# Patient Record
Sex: Male | Born: 1950 | Race: White | Hispanic: No | Marital: Married | Smoking: Never smoker
Health system: Southern US, Community
[De-identification: ages and names within clinical notes are randomized; demographics above are authoritative.]

## PROBLEM LIST (undated history)

## (undated) DIAGNOSIS — M1990 Unspecified osteoarthritis, unspecified site: Secondary | ICD-10-CM

## (undated) DIAGNOSIS — K648 Other hemorrhoids: Secondary | ICD-10-CM

## (undated) DIAGNOSIS — E7800 Pure hypercholesterolemia, unspecified: Secondary | ICD-10-CM

## (undated) DIAGNOSIS — K259 Gastric ulcer, unspecified as acute or chronic, without hemorrhage or perforation: Secondary | ICD-10-CM

## (undated) DIAGNOSIS — D126 Benign neoplasm of colon, unspecified: Secondary | ICD-10-CM

## (undated) DIAGNOSIS — I10 Essential (primary) hypertension: Secondary | ICD-10-CM

## (undated) DIAGNOSIS — T7840XA Allergy, unspecified, initial encounter: Secondary | ICD-10-CM

## (undated) DIAGNOSIS — J45909 Unspecified asthma, uncomplicated: Secondary | ICD-10-CM

## (undated) DIAGNOSIS — H409 Unspecified glaucoma: Secondary | ICD-10-CM

## (undated) HISTORY — PX: EYE SURGERY: SHX253

## (undated) HISTORY — DX: Benign neoplasm of colon, unspecified: D12.6

## (undated) HISTORY — DX: Unspecified osteoarthritis, unspecified site: M19.90

## (undated) HISTORY — DX: Allergy, unspecified, initial encounter: T78.40XA

## (undated) HISTORY — DX: Other hemorrhoids: K64.8

---

## 2004-03-10 ENCOUNTER — Ambulatory Visit: Payer: Self-pay | Admitting: Family Medicine

## 2006-01-26 HISTORY — PX: COLONOSCOPY: SHX174

## 2006-04-12 ENCOUNTER — Ambulatory Visit: Payer: Self-pay | Admitting: Gastroenterology

## 2006-04-27 DIAGNOSIS — D126 Benign neoplasm of colon, unspecified: Secondary | ICD-10-CM

## 2006-04-27 HISTORY — DX: Benign neoplasm of colon, unspecified: D12.6

## 2006-05-10 ENCOUNTER — Ambulatory Visit: Payer: Self-pay | Admitting: Gastroenterology

## 2011-03-31 ENCOUNTER — Encounter: Payer: Self-pay | Admitting: Gastroenterology

## 2012-04-28 ENCOUNTER — Encounter: Payer: Self-pay | Admitting: Gastroenterology

## 2012-06-18 ENCOUNTER — Emergency Department (HOSPITAL_COMMUNITY): Payer: No Typology Code available for payment source

## 2012-06-18 ENCOUNTER — Inpatient Hospital Stay (HOSPITAL_COMMUNITY)
Admission: EM | Admit: 2012-06-18 | Discharge: 2012-06-21 | DRG: 378 | Disposition: A | Discharge status: Home / Self Care | Payer: No Typology Code available for payment source | Attending: Internal Medicine | Admitting: Internal Medicine

## 2012-06-18 ENCOUNTER — Encounter (HOSPITAL_COMMUNITY): Payer: Self-pay

## 2012-06-18 DIAGNOSIS — D62 Acute posthemorrhagic anemia: Secondary | ICD-10-CM

## 2012-06-18 DIAGNOSIS — R000 Tachycardia, unspecified: Secondary | ICD-10-CM | POA: Diagnosis not present

## 2012-06-18 DIAGNOSIS — J45909 Unspecified asthma, uncomplicated: Secondary | ICD-10-CM | POA: Diagnosis present

## 2012-06-18 DIAGNOSIS — T39095A Adverse effect of salicylates, initial encounter: Secondary | ICD-10-CM | POA: Diagnosis present

## 2012-06-18 DIAGNOSIS — Z860100 Personal history of colon polyps, unspecified: Secondary | ICD-10-CM

## 2012-06-18 DIAGNOSIS — D72829 Elevated white blood cell count, unspecified: Secondary | ICD-10-CM

## 2012-06-18 DIAGNOSIS — K219 Gastro-esophageal reflux disease without esophagitis: Secondary | ICD-10-CM | POA: Diagnosis present

## 2012-06-18 DIAGNOSIS — IMO0002 Reserved for concepts with insufficient information to code with codable children: Secondary | ICD-10-CM

## 2012-06-18 DIAGNOSIS — I10 Essential (primary) hypertension: Secondary | ICD-10-CM | POA: Diagnosis present

## 2012-06-18 DIAGNOSIS — Z7982 Long term (current) use of aspirin: Secondary | ICD-10-CM

## 2012-06-18 DIAGNOSIS — T380X5A Adverse effect of glucocorticoids and synthetic analogues, initial encounter: Secondary | ICD-10-CM | POA: Diagnosis present

## 2012-06-18 DIAGNOSIS — R51 Headache: Secondary | ICD-10-CM | POA: Diagnosis present

## 2012-06-18 DIAGNOSIS — Z79899 Other long term (current) drug therapy: Secondary | ICD-10-CM

## 2012-06-18 DIAGNOSIS — E871 Hypo-osmolality and hyponatremia: Secondary | ICD-10-CM

## 2012-06-18 DIAGNOSIS — E860 Dehydration: Secondary | ICD-10-CM

## 2012-06-18 DIAGNOSIS — R7989 Other specified abnormal findings of blood chemistry: Secondary | ICD-10-CM

## 2012-06-18 DIAGNOSIS — K921 Melena: Principal | ICD-10-CM

## 2012-06-18 DIAGNOSIS — K269 Duodenal ulcer, unspecified as acute or chronic, without hemorrhage or perforation: Secondary | ICD-10-CM

## 2012-06-18 DIAGNOSIS — E7800 Pure hypercholesterolemia, unspecified: Secondary | ICD-10-CM | POA: Diagnosis present

## 2012-06-18 DIAGNOSIS — T39395A Adverse effect of other nonsteroidal anti-inflammatory drugs [NSAID], initial encounter: Secondary | ICD-10-CM

## 2012-06-18 DIAGNOSIS — I951 Orthostatic hypotension: Secondary | ICD-10-CM | POA: Diagnosis present

## 2012-06-18 DIAGNOSIS — R0602 Shortness of breath: Secondary | ICD-10-CM | POA: Diagnosis present

## 2012-06-18 DIAGNOSIS — T3995XA Adverse effect of unspecified nonopioid analgesic, antipyretic and antirheumatic, initial encounter: Secondary | ICD-10-CM | POA: Diagnosis present

## 2012-06-18 DIAGNOSIS — Z9109 Other allergy status, other than to drugs and biological substances: Secondary | ICD-10-CM

## 2012-06-18 DIAGNOSIS — K259 Gastric ulcer, unspecified as acute or chronic, without hemorrhage or perforation: Secondary | ICD-10-CM

## 2012-06-18 DIAGNOSIS — Z791 Long term (current) use of non-steroidal anti-inflammatories (NSAID): Secondary | ICD-10-CM

## 2012-06-18 DIAGNOSIS — N179 Acute kidney failure, unspecified: Secondary | ICD-10-CM

## 2012-06-18 DIAGNOSIS — K922 Gastrointestinal hemorrhage, unspecified: Secondary | ICD-10-CM | POA: Diagnosis present

## 2012-06-18 DIAGNOSIS — H409 Unspecified glaucoma: Secondary | ICD-10-CM | POA: Diagnosis present

## 2012-06-18 HISTORY — DX: Unspecified glaucoma: H40.9

## 2012-06-18 HISTORY — DX: Unspecified asthma, uncomplicated: J45.909

## 2012-06-18 HISTORY — DX: Essential (primary) hypertension: I10

## 2012-06-18 HISTORY — DX: Pure hypercholesterolemia, unspecified: E78.00

## 2012-06-18 LAB — URINALYSIS, ROUTINE W REFLEX MICROSCOPIC
Bilirubin Urine: NEGATIVE
Ketones, ur: NEGATIVE mg/dL
Nitrite: NEGATIVE
Protein, ur: NEGATIVE mg/dL
Urobilinogen, UA: 0.2 mg/dL (ref 0.0–1.0)

## 2012-06-18 LAB — CBC
HCT: 27 % — ABNORMAL LOW (ref 39.0–52.0)
MCH: 31.5 pg (ref 26.0–34.0)
MCHC: 34.8 g/dL (ref 30.0–36.0)
RDW: 13.9 % (ref 11.5–15.5)

## 2012-06-18 LAB — CBC WITH DIFFERENTIAL/PLATELET
Hemoglobin: 10.5 g/dL — ABNORMAL LOW (ref 13.0–17.0)
Lymphocytes Relative: 16 % (ref 12–46)
Lymphs Abs: 2.9 10*3/uL (ref 0.7–4.0)
MCV: 89.7 fL (ref 78.0–100.0)
Monocytes Relative: 6 % (ref 3–12)
Neutrophils Relative %: 78 % — ABNORMAL HIGH (ref 43–77)
Platelets: 294 10*3/uL (ref 150–400)
RBC: 3.4 MIL/uL — ABNORMAL LOW (ref 4.22–5.81)
WBC: 18.7 10*3/uL — ABNORMAL HIGH (ref 4.0–10.5)

## 2012-06-18 LAB — COMPREHENSIVE METABOLIC PANEL
ALT: 15 U/L (ref 0–53)
Alkaline Phosphatase: 58 U/L (ref 39–117)
CO2: 18 mEq/L — ABNORMAL LOW (ref 19–32)
GFR calc Af Amer: 39 mL/min — ABNORMAL LOW (ref >90)
GFR calc non Af Amer: 34 mL/min — ABNORMAL LOW (ref >90)
Glucose, Bld: 138 mg/dL — ABNORMAL HIGH (ref 70–99)
Potassium: 4 mEq/L (ref 3.5–5.1)
Sodium: 132 mEq/L — ABNORMAL LOW (ref 135–145)
Total Bilirubin: 0.6 mg/dL (ref 0.3–1.2)

## 2012-06-18 LAB — APTT: aPTT: 26 seconds (ref 24–37)

## 2012-06-18 LAB — CG4 I-STAT (LACTIC ACID): Lactic Acid, Venous: 1.12 mmol/L (ref 0.5–2.2)

## 2012-06-18 LAB — ABO/RH: ABO/RH(D): O POS

## 2012-06-18 MED ORDER — ONDANSETRON HCL 4 MG/2ML IJ SOLN: 4.0000 mg | Freq: Four times a day (QID) | INTRAMUSCULAR | Status: DC | PRN

## 2012-06-18 MED ORDER — ONDANSETRON HCL 4 MG/2ML IJ SOLN
4.0000 mg | Freq: Once | INTRAMUSCULAR | Status: AC
  Administered 2012-06-18: 4 mg via INTRAVENOUS
  Filled 2012-06-18: qty 2

## 2012-06-18 MED ORDER — SODIUM CHLORIDE 0.9 % IV BOLUS (SEPSIS)
1000.0000 mL | Freq: Once | INTRAVENOUS | Status: AC
  Administered 2012-06-18: 1000 mL via INTRAVENOUS

## 2012-06-18 MED ORDER — IOHEXOL 300 MG/ML  SOLN
60.0000 mL | Freq: Once | INTRAMUSCULAR | Status: AC | PRN
  Administered 2012-06-18: 60 mL via INTRAVENOUS

## 2012-06-18 MED ORDER — IOHEXOL 300 MG/ML  SOLN
50.0000 mL | Freq: Once | INTRAMUSCULAR | Status: AC | PRN
  Administered 2012-06-18: 50 mL via ORAL

## 2012-06-18 MED ORDER — ONDANSETRON HCL 4 MG PO TABS: 4.0000 mg | ORAL_TABLET | Freq: Four times a day (QID) | ORAL | Status: DC | PRN

## 2012-06-18 MED ORDER — PANTOPRAZOLE SODIUM 40 MG IV SOLR
40.0000 mg | Freq: Two times a day (BID) | INTRAVENOUS | Status: DC
  Administered 2012-06-18 – 2012-06-20 (×4): 40 mg via INTRAVENOUS
  Filled 2012-06-18 (×5): qty 40

## 2012-06-18 MED ORDER — ALBUTEROL SULFATE HFA 108 (90 BASE) MCG/ACT IN AERS: 2.0000 | INHALATION_SPRAY | RESPIRATORY_TRACT | Status: DC | PRN

## 2012-06-18 MED ORDER — SODIUM CHLORIDE 0.9 % IV SOLN
INTRAVENOUS | Status: AC
  Administered 2012-06-19: 01:00:00 via INTRAVENOUS

## 2012-06-18 NOTE — ED Notes (Signed)
MD at bedside. 

## 2012-06-18 NOTE — ED Notes (Signed)
Pt returned from CT.  MD at bedside.

## 2012-06-18 NOTE — ED Notes (Signed)
Pt states Thursday night he went to RR and had black tarry stool.  On Friday, pt had diarrhea and solid black stools.  Pt felt orthostatic after BM.  Pt states worse since then, did not feel well.  Had continued black stools.

## 2012-06-18 NOTE — H&P (Signed)
Hospital Admission Note Date: 06/18/2012  Patient name: Brett Robles Medical record number: [id number] Date of birth: 12/09/1950 Age: 62 y.o. Gender: male PCP: Janie Morning, DO  Medical Service: Internal Medicine Teaching Service  Attending physician:  Dr. Lynnae January    1st Contact: Dr. Algis Liming   Pager: [phone] 2nd Contact: Dr. Nicoletta Dress    Pager: [phone] After 5 pm or weekends: 1st Contact:      Pager: [phone] 2nd Contact:      Pager: [phone]  Chief Complaint: melena  History of Present Illness:  Mr. Smallman is a 62 yo man with PMH of glaucoma, HTN, HLD, who presents to the [facility] ED with complaints of melena. He states that on the evening 2 days prior to admission he had a bowel movement of soft, brown stool which turned into black, tarry, loose stool by completion. He awoke the next morning and felt lightheaded and short of breath upon standing. He subsequently had another black, tarry bowel movement which was more watery in consistency than the previous one.  He has continued to have black, tarry BMs since that time, and states that he has avoided eating any food, or drinking any liquids (other than water) since his symptoms began, due to concern that these would worsen the issue. He reports nausea but no vomiting.  He admits to abdominal discomfort which he describes as a "gnawing" sensation which has worsened over the last couple of weeks--he takes tums for this issue and has recently increased his use of this medication since the issue worsened (4 tums daily).  No recent sick contacts. He admits to taking two 325 mg aspirin tablets yesterday for a headache and admits to taking aspirin occasionally as needed for headache in the past.   The patient has a history of asthma and seasonal allergies. He states that for the last 2 weeks he has had worsened allergy/asthma symptoms, and was prescribed Bactrim and prednisone by his PCP approximately one week ago for this issue. He also has had decreased energy  for the last 2 weeks, and has been feeling short of breath for the same period of time (although his SOB is more severely worsened over the last 2 days, per above).   Patient had a colonoscopy in 2008 at [place] (Dr. Fuller Plan) which was revealing of an adenomatous polyp for which he is due for a repeat colonoscopy.   Meds: Current Outpatient Prescriptions  Medication Sig Dispense Refill  . acetaZOLAMIDE (DIAMOX) 250 MG tablet Take 250 mg by mouth 2 (two) times daily.      Marland Kitchen albuterol (PROVENTIL HFA;VENTOLIN HFA) 108 (90 BASE) MCG/ACT inhaler Inhale 2 puffs into the lungs every 6 (six) hours as needed for wheezing. For wheezing/shortness of breath      . beta carotene w/minerals (OCUVITE) tablet Take 1 tablet by mouth daily.      Marland Kitchen lisinopril (PRINIVIL,ZESTRIL) 10 MG tablet Take 10 mg by mouth daily.      . Multiple Vitamin (MULTIVITAMIN WITH MINERALS) TABS Take 1 tablet by mouth daily.      Marland Kitchen OVER THE COUNTER MEDICATION Place 1 drop into both eyes 2 (two) times daily as needed. For dry eyes Medication:OTC re-wetting drop (name unknown)      . pravastatin (PRAVACHOL) 40 MG tablet Take 40 mg by mouth every evening.      . sulfamethoxazole-trimethoprim (BACTRIM DS) 800-160 MG per tablet Take 1 tablet by mouth 2 (two) times daily. Began on 06/10/12  Allergies: Allergies as of 06/18/2012  . (No Known Allergies)   Past Medical History  Diagnosis Date  . Hypertension   . High cholesterol   . Glaucoma    Past surgical history: none Family history: none  History   Social History  . Marital Status: Married    Spouse Name: N/A    Number of Children: N/A  . Years of Education: N/A   Occupational History  . engineer   Social History Main Topics  . Smoking status: Never Smoker   . Smokeless tobacco: Not on file  . Alcohol Use: No  . Drug Use: No  . Sexually Active: Yes   Other Topics Concern  . Not on file   Social History Narrative  . No narrative on file    Review of  Systems: Constitutional: Denies fever, chills, diaphoresis, + appetite change and +fatigue.  HEENT: Denies photophobia, eye pain, redness, hearing loss, ear pain, +congestion, sore throat,+ rhinorrhea, sneezing, mouth sores, trouble swallowing, neck pain, neck stiffness and tinnitus.  Respiratory: + SOB, +DOE, cough, chest tightness, and wheezing.  Cardiovascular: Denies chest pain, palpitations and leg swelling.  Gastrointestinal: +nausea, vomiting, +abdominal pain, +diarrhea, constipation, +blood in stool and abdominal distention.  Genitourinary: Denies dysuria, urgency, frequency, hematuria, flank pain and difficulty urinating.  Musculoskeletal: Denies myalgias, back pain, joint swelling, arthralgias and gait problem.  Skin: Denies pallor, rash and wound.  Neurological: Denies dizziness, seizures, syncope, weakness, +lightheadedness, numbness and headaches.  Hematological: Denies adenopathy. Easy bruising, personal or family bleeding history  Psychiatric/Behavioral: Denies suicidal ideation, mood changes, confusion, nervousness, sleep disturbance and agitation  Physical Exam: Blood pressure 110/68, pulse 78, temperature 98.4 F (36.9 C), temperature source Oral, resp. rate 16, height 5' 9"  (1.753 m), weight 180 lb (81.647 kg), SpO2 100.00%. General: alert, well-developed, and cooperative to examination. Jaundiced.  Head: normocephalic and atraumatic.  Eyes: vision grossly intact, no injection and anicteric.  Mouth: pharynx pink and moist, no erythema, and no exudates.  Neck: supple, full ROM, no thyromegaly, no JVD, and no carotid bruits.  Lungs: normal respiratory effort, no accessory muscle use, normal breath sounds, no crackles, and no wheezes. Heart: normal rate, regular rhythm, no murmur, no gallop, and no rub.  Abdomen: soft, non-tender, normal bowel sounds, no distention, no guarding, no rebound tenderness, no hepatomegaly, and no splenomegaly.  Msk: no joint swelling, no joint  warmth, and no redness over joints.  Pulses: 2+ DP/PT pulses bilaterally Extremities: No cyanosis, clubbing, edema Neurologic: alert & oriented X3, cranial nerves II-XII intact, strength normal in all extremities, sensation intact to light touch, and gait normal.  Skin: turgor normal and no rashes.  Psych: Oriented X3, memory intact for recent and remote, normally interactive, good eye contact, not anxious appearing, and not depressed appearing.  Lab results: Basic Metabolic Panel:  Recent Labs  06/18/12 1842  NA 132*  K 4.0  CL 104  CO2 18*  GLUCOSE 138*  BUN 73*  CREATININE 1.98*  CALCIUM 9.2   Liver Function Tests:  Recent Labs  06/18/12 1842  AST 11  ALT 15  ALKPHOS 58  BILITOT 0.6  PROT 6.3  ALBUMIN 3.3*    Recent Labs  06/18/12 1842  LIPASE 68*   CBC:  Recent Labs  06/18/12 1842  WBC 18.7*  NEUTROABS 14.5*  HGB 10.5*  HCT 30.5*  MCV 89.7  PLT 294   Coagulation:  Recent Labs  06/18/12 1842  LABPROT 13.7  INR 1.06   Urinalysis:  Recent Labs  06/18/12 2024  COLORURINE YELLOW  LABSPEC 1.015  PHURINE 5.5  GLUCOSEU NEGATIVE  HGBUR NEGATIVE  BILIRUBINUR NEGATIVE  KETONESUR NEGATIVE  PROTEINUR NEGATIVE  UROBILINOGEN 0.2  NITRITE NEGATIVE  LEUKOCYTESUR NEGATIVE   Imaging results:  Ct Abdomen Pelvis W Contrast  06/18/2012   *RADIOLOGY REPORT*  Clinical Data: GI bleed  CT ABDOMEN AND PELVIS WITH CONTRAST  Technique:  Multidetector CT imaging of the abdomen and pelvis was performed following the standard protocol during bolus administration of intravenous contrast.  Contrast: 68m OMNIPAQUE IOHEXOL 300 MG/ML  SOLN  Comparison: None.  Findings: Lung bases are clear.  Liver, spleen, pancreas, and adrenal glands are within normal limits.  Gallbladder is unremarkable.  No intrahepatic or extrahepatic ductal dilatation.  2 mm nonobstructing right lower pole renal calculus (series 2/image 37).  Left extrarenal pelvis.  No hydronephrosis.  No evidence  of bowel obstruction.  Normal appendix.  No colonic wall thickening or inflammatory changes.  Atherosclerotic calcifications of the abdominal aorta and branch vessels.  No abdominopelvic ascites.  No suspicious abdominopelvic lymphadenopathy.  Prostate is at the upper limits of normal for size, measuring 4.9 cm in transverse dimension.  Bladder is unremarkable.  Degenerative changes of the visualized thoracolumbar spine.  Grade 1 spondylolisthesis at L5-S1.  IMPRESSION: No evidence of bowel obstruction.  Normal appendix.  No colonic wall thickening or inflammatory changes.  2 mm nonobstructing right lower pole renal calculus.  No hydronephrosis.   Original Report Authenticated By: SJulian Hy M.D.   Other results: EKG:   Assessment & Plan by Problem:  GI Bleed - Hb = 10.5. FOBT positive in the ED. Signs/symptoms are most consistent with peptic ulcer disease, particularly in the context of the patient's history of chronic aspirin usage, and her recent prednisone usage. Pt is visibly jaundiced, which he notes as a new finding as well, however total bili is wnl (of note, he takes beta-carotene). Lipase slightly elevated. CT abdomen was negative for any acute findings. Ischemic colitis cannot be ruled out, however is unlikely given his complaints have persisted for several days and he has remained stable throughout that period of time. As the patient is stable, he will be admitted to the floor at this time. - Admit to floor - Monitor vitals - d/c prednisone - Protonix IV bid - type and screen - trend CBCs q8h - Consider GI consult for endoscopy  Acute kidney injury - Cr = 1.98. No baseline on record, but pt denies CKD). AKI most likely secondary to prerenal azotemia in the setting of decreased PO intake over the last few days as is further supported by the presence of orthostatic hypotension. He received a 1L NS bolus in the ED.  - repeat 1L NS bolus - start NS @ 150cc/hr - check BMET in  AM  SOB - likely secondary to anemia associated with GI bleed. Worsened asthma could be contributing, although he was CTA on exam. [place] regularly from [place], but denies any recent leg swelling or CP, SOB has been exertional, and revised geneva = 3 (low probability) making PE less likely. O2 sats 100% on RA.  - continue to monitor symptoms.   Leukocytosis - WBC = 18.7. Likely secondary to ongoing prednisone usage.  - d/c prednisone in setting of GI bleed - check CBC in AM  Hypertension - holding ACE-I in setting of AKI and hypotension.   DVT PPx - SCDs  Dispo: Disposition is deferred at this time, awaiting improvement of  current medical problems. Anticipated discharge in approximately 1-2 day(s).   The patient does have a current PCP (COLLINS, DANA, DO), therefore will not be requiring OPC follow-up after discharge.   The patient does not have transportation limitations that hinder transportation to clinic appointments.  Signed: Jeani Hawking, MD 06/18/2012, 10:32 PM

## 2012-06-18 NOTE — ED Provider Notes (Signed)
History     CSN: [id number]  Arrival date & time 06/18/12  0865   First MD Initiated Contact with Patient 06/18/12 1812      Chief Complaint  Patient presents with  . Abdominal Pain     Patient is a 62 y.o. male presenting with hematochezia.  Rectal Bleeding Quality:  Black and tarry Amount:  Moderate Duration:  3 days Timing:  Intermittent Progression:  Unchanged Chronicity:  New Context: diarrhea   Context: not anal fissures, not constipation, not hemorrhoids and not rectal pain   Similar prior episodes: no   Relieved by:  Nothing Ineffective treatments:  None tried Associated symptoms: abdominal pain (vauge, mild, intermittent discomfort in epigastric region for a few days) and light-headedness (he becomes very lilghtheaded with standing and feels faint)   Associated symptoms: no epistaxis, no fever, no loss of consciousness, no recent illness and no vomiting   Associated symptoms comment:  Has had significant nausea and generalized weakness.  No recent weight change.  No piror history of GI bleeding, liver disease, cirrhosis, hepatitis, varices, cancer, or alcohol use. Risk factors: no anticoagulant use, no hx of colorectal cancer, no hx of colorectal surgery, no liver disease and no NSAID use    He also noted acholic stools a few days ago.  His wife noted that his skin seemed yellow yesterday.  Past Medical History  Diagnosis Date  . Hypertension   . High cholesterol   . Glaucoma     No past surgical history on file.  No family history on file.  History  Substance Use Topics  . Smoking status: Never Smoker   . Smokeless tobacco: Not on file  . Alcohol Use: No      Review of Systems  Constitutional: Negative for fever and chills.  HENT: Negative for nosebleeds, congestion, rhinorrhea, neck pain and neck stiffness.   Eyes: Negative for visual disturbance.  Respiratory: Negative for cough and shortness of breath.   Cardiovascular: Negative for chest pain  and leg swelling.  Gastrointestinal: Positive for abdominal pain (vauge, mild, intermittent discomfort in epigastric region for a few days) and hematochezia. Negative for nausea, vomiting and diarrhea.  Genitourinary: Negative for dysuria, urgency, frequency, flank pain and difficulty urinating.  Musculoskeletal: Negative for back pain.  Skin: Negative for rash.  Neurological: Positive for light-headedness (he becomes very lilghtheaded with standing and feels faint). Negative for loss of consciousness, syncope, weakness, numbness and headaches.  All other systems reviewed and are negative.    Allergies  Review of patient's allergies indicates no known allergies.  Home Medications   Current Outpatient Rx  Name  Route  Sig  Dispense  Refill  . acetaZOLAMIDE (DIAMOX) 250 MG tablet   Oral   Take 250 mg by mouth 2 (two) times daily.         Marland Kitchen albuterol (PROVENTIL HFA;VENTOLIN HFA) 108 (90 BASE) MCG/ACT inhaler   Inhalation   Inhale 2 puffs into the lungs every 6 (six) hours as needed for wheezing. For wheezing/shortness of breath         . beta carotene w/minerals (OCUVITE) tablet   Oral   Take 1 tablet by mouth daily.         Marland Kitchen lisinopril (PRINIVIL,ZESTRIL) 10 MG tablet   Oral   Take 10 mg by mouth daily.         . Multiple Vitamin (MULTIVITAMIN WITH MINERALS) TABS   Oral   Take 1 tablet by mouth daily.         Marland Kitchen  OVER THE COUNTER MEDICATION   Both Eyes   Place 1 drop into both eyes 2 (two) times daily as needed. For dry eyes Medication:OTC re-wetting drop (name unknown)         . pravastatin (PRAVACHOL) 40 MG tablet   Oral   Take 40 mg by mouth every evening.         . sulfamethoxazole-trimethoprim (BACTRIM DS) 800-160 MG per tablet   Oral   Take 1 tablet by mouth 2 (two) times daily. Began on 06/10/12           BP 99/57  Pulse 80  Temp(Src) 99 F (37.2 C) (Oral)  Resp 17  Ht 5' 9"  (1.753 m)  Wt 180 lb (81.647 kg)  BMI 26.57 kg/m2  SpO2  100%  Physical Exam  Nursing note and vitals reviewed. Constitutional: He is oriented to person, place, and time. He appears well-developed and well-nourished. No distress.  HENT:  Head: Normocephalic and atraumatic.  Mouth/Throat: Oropharynx is clear and moist.  Eyes: Conjunctivae and EOM are normal. Pupils are equal, round, and reactive to light. No scleral icterus.  Neck: Normal range of motion. Neck supple. No JVD present.  Cardiovascular: Normal rate, regular rhythm, normal heart sounds and intact distal pulses.  Exam reveals no gallop and no friction rub.   No murmur heard. Pulmonary/Chest: Effort normal and breath sounds normal. No respiratory distress. He has no wheezes. He has no rales.  Abdominal: Soft. Normal appearance and bowel sounds are normal. He exhibits no distension. There is no hepatosplenomegaly. There is no tenderness. There is no rigidity, no rebound, no guarding, no CVA tenderness, no tenderness at McBurney's point and negative Murphy's sign. No hernia.  Genitourinary: Rectal exam shows no external hemorrhoid, no fissure, no mass and no tenderness. Guaiac positive stool.  Gross melanotic stool  Musculoskeletal: He exhibits no edema.  Neurological: He is alert and oriented to person, place, and time. No cranial nerve deficit. He exhibits normal muscle tone. Coordination normal.  Skin: Skin is warm and dry. He is not diaphoretic.    ED Course  Procedures (including critical care time)  Labs Reviewed  CBC WITH DIFFERENTIAL - Abnormal; Notable for the following:    WBC 18.7 (*)    RBC 3.40 (*)    Hemoglobin 10.5 (*)    HCT 30.5 (*)    Neutrophils Relative % 78 (*)    Neutro Abs 14.5 (*)    Monocytes Absolute 1.1 (*)    All other components within normal limits  COMPREHENSIVE METABOLIC PANEL - Abnormal; Notable for the following:    Sodium 132 (*)    CO2 18 (*)    Glucose, Bld 138 (*)    BUN 73 (*)    Creatinine, Ser 1.98 (*)    Albumin 3.3 (*)    GFR calc  non Af Amer 34 (*)    GFR calc Af Amer 39 (*)    All other components within normal limits  LIPASE, BLOOD - Abnormal; Notable for the following:    Lipase 68 (*)    All other components within normal limits  CBC - Abnormal; Notable for the following:    WBC 16.5 (*)    RBC 2.98 (*)    Hemoglobin 9.4 (*)    HCT 27.0 (*)    All other components within normal limits  URINALYSIS, ROUTINE W REFLEX MICROSCOPIC  PROTIME-INR  APTT  CG4 I-STAT (LACTIC ACID)  TYPE AND SCREEN  ABO/RH   Ct Abdomen Pelvis  W Contrast  06/18/2012   *RADIOLOGY REPORT*  Clinical Data: GI bleed  CT ABDOMEN AND PELVIS WITH CONTRAST  Technique:  Multidetector CT imaging of the abdomen and pelvis was performed following the standard protocol during bolus administration of intravenous contrast.  Contrast: 72m OMNIPAQUE IOHEXOL 300 MG/ML  SOLN  Comparison: None.  Findings: Lung bases are clear.  Liver, spleen, pancreas, and adrenal glands are within normal limits.  Gallbladder is unremarkable.  No intrahepatic or extrahepatic ductal dilatation.  2 mm nonobstructing right lower pole renal calculus (series 2/image 37).  Left extrarenal pelvis.  No hydronephrosis.  No evidence of bowel obstruction.  Normal appendix.  No colonic wall thickening or inflammatory changes.  Atherosclerotic calcifications of the abdominal aorta and branch vessels.  No abdominopelvic ascites.  No suspicious abdominopelvic lymphadenopathy.  Prostate is at the upper limits of normal for size, measuring 4.9 cm in transverse dimension.  Bladder is unremarkable.  Degenerative changes of the visualized thoracolumbar spine.  Grade 1 spondylolisthesis at L5-S1.  IMPRESSION: No evidence of bowel obstruction.  Normal appendix.  No colonic wall thickening or inflammatory changes.  2 mm nonobstructing right lower pole renal calculus.  No hydronephrosis.   Original Report Authenticated By: SJulian Hy M.D.     1. GI bleed   2. Melena   3. Dehydration   4. Acute  kidney injury       MDM  62year old male with hypertension presents with melena. Onset approximately 3 days ago. Since that time he has had some shortness of breath with exertion and some lightheadedness with standing. He is coughing at times. He's had a recent steroid use. No chronic NSAID use, no history of ulcers, no prior GI bleed. No history of liver disease or coagulopathy.  Tachycardic to 101, positive orthostatics, otherwise vitals are within normal limits. He is nontoxic-appearing, in no acute distress, abdomen is soft and nontender. His stool is grossly melanotic and Hemoccult positive. Workup otherwise pertinent for white blood cell count 18.7, hemoglobin 10.5, sodium 132, bicarbonate 18, BUN 73, creatinine 1.98. INR is normal, PTT normal. Lactate normal.   patient has GI bleed, melanotic, unknown source, possibly secondary to steroid use. He is acidotic and has acute kidney injury. Leukocytosis likely also secondary to steroid use. Next  Admitted to the hospitalist for acute kidney injury and GI bleed. Stable ED course. We'll not transfuse at this time. We'll need to recheck hemoglobin in the next few hours.        JWendall Papa MD 06/19/12 [phone]

## 2012-06-18 NOTE — ED Notes (Signed)
Admitting MD at bedside.

## 2012-06-18 NOTE — ED Provider Notes (Signed)
I saw and evaluated the patient, reviewed the resident's note and I agree with the findings and plan.  Pt seen and examined--labs and xrays reviewed, will admit for w/u of gi bleed  Leota Jacobsen, MD 06/18/12 2221

## 2012-06-18 NOTE — ED Notes (Signed)
Patient transported to CT 

## 2012-06-19 ENCOUNTER — Inpatient Hospital Stay (HOSPITAL_COMMUNITY): Payer: No Typology Code available for payment source

## 2012-06-19 ENCOUNTER — Encounter (HOSPITAL_COMMUNITY)
Admission: EM | Disposition: A | Discharge status: Home / Self Care | Payer: Self-pay | Source: Home / Self Care | Attending: Internal Medicine

## 2012-06-19 ENCOUNTER — Encounter (HOSPITAL_COMMUNITY): Payer: Self-pay | Admitting: *Deleted

## 2012-06-19 DIAGNOSIS — E860 Dehydration: Secondary | ICD-10-CM

## 2012-06-19 DIAGNOSIS — K922 Gastrointestinal hemorrhage, unspecified: Secondary | ICD-10-CM

## 2012-06-19 DIAGNOSIS — E871 Hypo-osmolality and hyponatremia: Secondary | ICD-10-CM

## 2012-06-19 DIAGNOSIS — D62 Acute posthemorrhagic anemia: Secondary | ICD-10-CM

## 2012-06-19 DIAGNOSIS — K921 Melena: Secondary | ICD-10-CM

## 2012-06-19 DIAGNOSIS — K269 Duodenal ulcer, unspecified as acute or chronic, without hemorrhage or perforation: Secondary | ICD-10-CM

## 2012-06-19 DIAGNOSIS — K259 Gastric ulcer, unspecified as acute or chronic, without hemorrhage or perforation: Secondary | ICD-10-CM

## 2012-06-19 DIAGNOSIS — T39395A Adverse effect of other nonsteroidal anti-inflammatory drugs [NSAID], initial encounter: Secondary | ICD-10-CM

## 2012-06-19 DIAGNOSIS — D72829 Elevated white blood cell count, unspecified: Secondary | ICD-10-CM

## 2012-06-19 DIAGNOSIS — R7989 Other specified abnormal findings of blood chemistry: Secondary | ICD-10-CM

## 2012-06-19 DIAGNOSIS — N179 Acute kidney failure, unspecified: Secondary | ICD-10-CM

## 2012-06-19 HISTORY — PX: ESOPHAGOGASTRODUODENOSCOPY: SHX5428

## 2012-06-19 LAB — COMPREHENSIVE METABOLIC PANEL
AST: 11 U/L (ref 0–37)
Albumin: 2.9 g/dL — ABNORMAL LOW (ref 3.5–5.2)
Alkaline Phosphatase: 50 U/L (ref 39–117)
CO2: 21 mEq/L (ref 19–32)
Chloride: 112 mEq/L (ref 96–112)
Creatinine, Ser: 1.5 mg/dL — ABNORMAL HIGH (ref 0.50–1.35)
GFR calc non Af Amer: 47 mL/min — ABNORMAL LOW (ref >90)
Potassium: 4.4 mEq/L (ref 3.5–5.1)
Total Bilirubin: 0.7 mg/dL (ref 0.3–1.2)

## 2012-06-19 LAB — CBC
HCT: 23.9 % — ABNORMAL LOW (ref 39.0–52.0)
Hemoglobin: 8 g/dL — ABNORMAL LOW (ref 13.0–17.0)
MCH: 30.9 pg (ref 26.0–34.0)
MCH: 31.2 pg (ref 26.0–34.0)
MCH: 30.7 pg (ref 26.0–34.0)
MCHC: 34.1 g/dL (ref 30.0–36.0)
MCHC: 34.3 g/dL (ref 30.0–36.0)
MCHC: 33.6 g/dL (ref 30.0–36.0)
MCV: 90.4 fL (ref 78.0–100.0)
MCV: 90.9 fL (ref 78.0–100.0)
MCV: 91.2 fL (ref 78.0–100.0)
Platelets: 214 10*3/uL (ref 150–400)
Platelets: 191 10*3/uL (ref 150–400)
Platelets: 192 10*3/uL (ref 150–400)
RBC: 2.82 MIL/uL — ABNORMAL LOW (ref 4.22–5.81)
RDW: 13.9 % (ref 11.5–15.5)
RDW: 14 % (ref 11.5–15.5)
WBC: 8 10*3/uL (ref 4.0–10.5)

## 2012-06-19 LAB — PROTIME-INR: Prothrombin Time: 13.8 seconds (ref 11.6–15.2)

## 2012-06-19 LAB — RETICULOCYTES
RBC.: 3.33 MIL/uL — ABNORMAL LOW (ref 4.22–5.81)
Retic Count, Absolute: 66.6 10*3/uL (ref 19.0–186.0)

## 2012-06-19 LAB — LACTATE DEHYDROGENASE: LDH: 253 U/L — ABNORMAL HIGH (ref 94–250)

## 2012-06-19 LAB — HAPTOGLOBIN: Haptoglobin: 152 mg/dL (ref 45–215)

## 2012-06-19 SURGERY — EGD (ESOPHAGOGASTRODUODENOSCOPY)
Anesthesia: Moderate Sedation

## 2012-06-19 MED ORDER — BUTAMBEN-TETRACAINE-BENZOCAINE 2-2-14 % EX AERO
INHALATION_SPRAY | CUTANEOUS | Status: DC | PRN
  Administered 2012-06-19: 1 via TOPICAL

## 2012-06-19 MED ORDER — MIDAZOLAM HCL 10 MG/2ML IJ SOLN
INTRAMUSCULAR | Status: DC | PRN
  Administered 2012-06-19 (×2): 2 mg via INTRAVENOUS

## 2012-06-19 MED ORDER — FENTANYL CITRATE 0.05 MG/ML IJ SOLN
INTRAMUSCULAR | Status: DC | PRN
  Administered 2012-06-19 (×2): 25 ug via INTRAVENOUS

## 2012-06-19 NOTE — Progress Notes (Signed)
Subjective: Pt is doing well this AM. No compliants. No nausea/vomiting/diarrhea/ repeat dark tarry stools. No other active bleeding or BRBPR. Still some fatigue and decreased physical activity tolerance.   Objective: Vital signs in last 24 hours: Filed Vitals:   06/19/12 0000 06/19/12 0125 06/19/12 0627 06/19/12 1001  BP: 105/61 101/60 96/50 106/54  Pulse: 83 69 70 73  Temp:  97.9 F (36.6 C) 98 F (36.7 C) 98.3 F (36.8 C)  TempSrc:  Oral Oral   Resp: 24 20 18 18   Height:  5' 8"  (1.727 m)    Weight:  177 lb 14.6 oz (80.7 kg)    SpO2: 100% 100% 100% 99%   Weight change:   Intake/Output Summary (Last 24 hours) at 06/19/12 1115 Last data filed at 06/19/12 0900  Gross per 24 hour  Intake      0 ml  Output   1400 ml  Net  -1400 ml   General: resting in bed HEENT: PERRL, EOMI, no scleral icterus Cardiac: RRR, no rubs, murmurs or gallops Pulm: clear to auscultation bilaterally, moving normal volumes of air Abd: soft, nontender, nondistended, BS present Ext: warm and well perfused, no pedal edema Neuro: alert and oriented X3, cranial nerves II-XII grossly intact  Lab Results: Basic Metabolic Panel:  Recent Labs Lab 06/18/12 1842 06/19/12 0530  NA 132* 138  K 4.0 4.4  CL 104 112  CO2 18* 21  GLUCOSE 138* 118*  BUN 73* 48*  CREATININE 1.98* 1.50*  CALCIUM 9.2 8.4   Liver Function Tests:  Recent Labs Lab 06/18/12 1842 06/19/12 0530  AST 11 11  ALT 15 12  ALKPHOS 58 50  BILITOT 0.6 0.7  PROT 6.3 5.4*  ALBUMIN 3.3* 2.9*    Recent Labs Lab 06/18/12 1842  LIPASE 68*   CBC:  Recent Labs Lab 06/18/12 1842 06/18/12 2330 06/19/12 0530  WBC 18.7* 16.5* 12.6*  NEUTROABS 14.5*  --   --   HGB 10.5* 9.4* 8.7*  HCT 30.5* 27.0* 25.5*  MCV 89.7 90.6 90.4  PLT 294 241 214   Coagulation:  Recent Labs Lab 06/18/12 1842 06/19/12 0530  LABPROT 13.7 13.8  INR 1.06 1.07   Anemia Panel:  Recent Labs Lab 06/19/12 0052  RETICCTPCT 2.0    Urinalysis:  Recent Labs Lab 06/18/12 2024  COLORURINE YELLOW  LABSPEC 1.015  PHURINE 5.5  GLUCOSEU NEGATIVE  HGBUR NEGATIVE  BILIRUBINUR NEGATIVE  KETONESUR NEGATIVE  PROTEINUR NEGATIVE  UROBILINOGEN 0.2  NITRITE NEGATIVE  LEUKOCYTESUR NEGATIVE    Micro Results: No results found for this or any previous visit (from the past 240 hour(s)). Studies/Results: Dg Chest 2 View  06/19/2012   *RADIOLOGY REPORT*  Clinical Data: Shortness of breath  CHEST - 2 VIEW  Comparison: None.  Findings: Cardiomediastinal silhouette is within normal limits. The lungs are clear. No pleural effusion.  No pneumothorax.  No acute osseous abnormality.  Residual contrast within nondilated colon.  IMPRESSION: No acute cardiopulmonary process.   Original Report Authenticated By: Conchita Paris, M.D.   Ct Abdomen Pelvis W Contrast  06/18/2012   *RADIOLOGY REPORT*  Clinical Data: GI bleed  CT ABDOMEN AND PELVIS WITH CONTRAST  Technique:  Multidetector CT imaging of the abdomen and pelvis was performed following the standard protocol during bolus administration of intravenous contrast.  Contrast: 36m OMNIPAQUE IOHEXOL 300 MG/ML  SOLN  Comparison: None.  Findings: Lung bases are clear.  Liver, spleen, pancreas, and adrenal glands are within normal limits.  Gallbladder is unremarkable.  No  intrahepatic or extrahepatic ductal dilatation.  2 mm nonobstructing right lower pole renal calculus (series 2/image 37).  Left extrarenal pelvis.  No hydronephrosis.  No evidence of bowel obstruction.  Normal appendix.  No colonic wall thickening or inflammatory changes.  Atherosclerotic calcifications of the abdominal aorta and branch vessels.  No abdominopelvic ascites.  No suspicious abdominopelvic lymphadenopathy.  Prostate is at the upper limits of normal for size, measuring 4.9 cm in transverse dimension.  Bladder is unremarkable.  Degenerative changes of the visualized thoracolumbar spine.  Grade 1 spondylolisthesis at L5-S1.   IMPRESSION: No evidence of bowel obstruction.  Normal appendix.  No colonic wall thickening or inflammatory changes.  2 mm nonobstructing right lower pole renal calculus.  No hydronephrosis.   Original Report Authenticated By: Julian Hy, M.D.   Medications: I have reviewed the patient's current medications. Scheduled Meds: . pantoprazole (PROTONIX) IV  40 mg Intravenous Q12H   Continuous Infusions: . sodium chloride 150 mL/hr at 06/19/12 0037   PRN Meds:.albuterol, ondansetron (ZOFRAN) IV, ondansetron Assessment/Plan: # GI Bleed - Hb = 10.5 >>8.7. FOBT positive in the ED. Signs/symptoms are most consistent with peptic ulcer disease, particularly in the context of the patient's history of chronic aspirin usage, and her recent prednisone usage. Pt is visibly jaundiced, which he notes as a new finding as well, however total bili is wnl (of note, he takes beta-carotene). Lipase slightly elevated. CT abdomen was negative for any acute findings. Ischemic colitis cannot be ruled out, however is unlikely given his complaints have persisted for several days and he has remained stable throughout that period of time. As the patient is stable, he will be admitted to the floor at this time.  - transfer to stepdown if Hgb continues to drop or starts bleeding again - Monitor vitals  - Protonix IV bid  - type and screen  - trend CBCs q8h  - Consider GI consult for endoscopy planned today  # Acute kidney injury - Cr = 1.98 >>1.5 with IVF blouses. No baseline on record, but pt denies CKD). AKI most likely secondary to prerenal azotemia in the setting of decreased PO intake over the last few days as is further supported by the presence of orthostatic hypotension. He received total of 3L bolus.   - start NS @ 150cc/hr  - check BMET in AM   # SOB - likely secondary to anemia associated with GI bleed. Worsened asthma could be contributing, although he was CTA on exam. Pt travels regularly from [place] to [place]  on business, but denies any recent leg swelling or CP, SOB has been exertional, and revised geneva = 3 (low probability) making PE less likely. O2 sats 100% on RA.  - continue to monitor symptoms.   Leukocytosis - WBC = 18.7>>12.6. Likely secondary to ongoing prednisone usage and previous hemoconcentration.  - d/c prednisone in setting of GI bleed  - check CBC in AM   Hypotension - Pt has hx of HTN but holding ACE-I in setting of AKI and hypotension.   Dispo: Disposition is deferred at this time, awaiting improvement of current medical problems.  Anticipated discharge in approximately 1-2 day(s).   The patient does have a current PCP (COLLINS, DANA, DO), therefore will be requiring OPC follow-up after discharge.   The patient does not have transportation limitations that hinder transportation to clinic appointments.  .Services Needed at time of discharge: Y = Yes, Blank = No PT:   OT:   RN:   Equipment:  Other:     LOS: 1 day   Clinton Gallant 06/19/2012, 11:15 AM Pgr: [phone]

## 2012-06-19 NOTE — H&P (Signed)
Internal Medicine Teaching Service Attending Note Date: 06/19/2012  Patient name: Brett Robles  Medical record number: [id number]  Date of birth: 03-08-1950   I have read the documentation on this case by Dr. Para Skeans and agree with it with the following additions/observations:   In short, the patient, Brett Robles, is a 62 y.o. year old male, with past medical history of hypertension, hyperlipidemia, comes in with the chief  complaint of malena for the past couple days, recently increased epigastric pain. He has dizziness and feels short of breath and fatigued, and was orthostatic on exam in the hospital. He has been resuscitated with fluids overnight and does not have dizziness any more this morning. He feels better, has not had another BM and has no epigastric pain at this time. He is noted to have been using aspirin PRN and prednisone recently prescribed for SOB.  Past medical history, social history and medications have been reviewed.   Review of systems as per HPI and resident note.   Filed Vitals:   06/18/12 2318 06/19/12 0000 06/19/12 0125 06/19/12 0627  BP: 99/57 105/61 101/60 96/50  Pulse: 80 83 69 70  Temp: 99 F (37.2 C)  97.9 F (36.6 C) 98 F (36.7 C)  TempSrc: Oral  Oral Oral  Resp: 17 24 20 18   Height:   5' 8"  (1.727 m)   Weight:   177 lb 14.6 oz (80.7 kg)   SpO2: 100% 100% 100% 100%    Exam:  General: Lying in bed, comfortable, no acute distress. HEENT: PERRL, EOMI, does appear a little yellow skinned Heart: RRR, no rubs, murmurs or gallops. Not tachycardic.  Lungs: Clear to auscultation bilaterally, no wheezes, rales, or rhonchi. Abdomen: Soft, nontender, nondistended, BS present. Extremities: Warm, no pedal edema. Neuro: Alert and oriented X3, cranial nerves II-XII grossly intact,  strength and sensation to light touch equal in bilateral upper and lower extremities   Recent Labs Lab 06/18/12 1842 06/18/12 2330 06/19/12 0530  HGB 10.5* 9.4* 8.7*  HCT  30.5* 27.0* 25.5*  WBC 18.7* 16.5* 12.6*  PLT 294 241 214    Recent Labs Lab 06/18/12 1842 06/19/12 0530  NA 132* 138  K 4.0 4.4  CL 104 112  CO2 18* 21  GLUCOSE 138* 118*  BUN 73* 48*  CREATININE 1.98* 1.50*  CALCIUM 9.2 8.4    Recent Labs Lab 06/18/12 1842 06/19/12 0530  AST 11 11  ALT 15 12  ALKPHOS 58 50  BILITOT 0.6 0.7  PROT 6.3 5.4*  ALBUMIN 3.3* 2.9*  INR 1.06 1.07   Urinalysis    Component Value Date/Time   COLORURINE YELLOW 06/18/2012 2024   APPEARANCEUR CLEAR 06/18/2012 2024   LABSPEC 1.015 06/18/2012 2024   PHURINE 5.5 06/18/2012 2024   GLUCOSEU NEGATIVE 06/18/2012 2024   HGBUR NEGATIVE 06/18/2012 2024   BILIRUBINUR NEGATIVE 06/18/2012 2024   KETONESUR NEGATIVE 06/18/2012 2024   PROTEINUR NEGATIVE 06/18/2012 2024   UROBILINOGEN 0.2 06/18/2012 2024   NITRITE NEGATIVE 06/18/2012 2024   LEUKOCYTESUR NEGATIVE 06/18/2012 2024    Imaging results:  Ct Abdomen Pelvis W Contrast  06/18/2012 *RADIOLOGY REPORT* Clinical Data: GI bleed CT ABDOMEN AND PELVIS WITH CONTRAST Technique: Multidetector CT imaging of the abdomen and pelvis was performed following the standard protocol during bolus administration of intravenous contrast. Contrast: 5m OMNIPAQUE IOHEXOL 300 MG/ML SOLN Comparison: None. Findings: Lung bases are clear. Liver, spleen, pancreas, and adrenal glands are within normal limits. Gallbladder is unremarkable. No intrahepatic or extrahepatic ductal  dilatation. 2 mm nonobstructing right lower pole renal calculus (series 2/image 37). Left extrarenal pelvis. No hydronephrosis. No evidence of bowel obstruction. Normal appendix. No colonic wall thickening or inflammatory changes. Atherosclerotic calcifications of the abdominal aorta and branch vessels. No abdominopelvic ascites. No suspicious abdominopelvic lymphadenopathy. Prostate is at the upper limits of normal for size, measuring 4.9 cm in transverse dimension. Bladder is unremarkable. Degenerative changes of the  visualized thoracolumbar spine. Grade 1 spondylolisthesis at L5-S1. IMPRESSION: No evidence of bowel obstruction. Normal appendix. No colonic wall thickening or inflammatory changes. 2 mm nonobstructing right lower pole renal calculus. No hydronephrosis. Original Report Authenticated By: Julian Hy, M.D.    Assessment and Plan  GI Bleed in the setting of Aspirin and Prednisone use: Agree with the management of residents, Protonix IV BID and discontinuation of aspirin and prednisone at this time. Gi consultation and endoscopy awaited.   Acute anemia, with decreasing counts, in the setting of GI bleed - Currently the patient is not complaining of any dizziness, or shortness of breath. We will transfuse if the patient has another black Bm, or if he has further symptoms, or if his Hgb drops below 7.   Recent Labs Lab 06/18/12 1842 06/18/12 2330 06/19/12 0530  HGB 10.5* 9.4* 8.7*   We continue to observe, and do CBC Q8. Patient is keeping soft blood pressure, however is not tachycardic. If the patient, develops tachycardia, BP plummets further or severe abdominal pain, low threshold to move to ICU.  Acute Kidney Injury: BUN and creatinine reflect the setting of acute GI bleed. Hydrate, and observe.   Recent Labs Lab 06/18/12 1842 06/19/12 0530  BUN 73* 48*  CREATININE 1.98* 1.50*   Rest per resident note.   [place], Miller 06/19/2012, 9:49 AM.

## 2012-06-19 NOTE — Consult Note (Signed)
[facility] Consult:                                       9:24 AM 06/19/2012   Referring Provider:  Dr Para Skeans of internal medicine teaching service.  Primary Care Physician:  Janie Morning, DO of [facility] Primary Gastroenterologist:  Dr. Fuller Plan, pt has not followed up   Reason for Consultation:  Melena and normocytic anemia  HPI: Brett Robles is a 62 y.o. male.  He takes generic Excedrin once daily for headaches related to sinus congestion.  No GI meds generally as he has few if any GI sxs.  For 2 weeks taking PRN tums for relief of heartburn.  On Thursday evening had formed black stool, Friday AM this was loose and tarry.  Proceeded to drive 093 miles from [place] back to [place].  Formed black stool on Saturday.  No nausea, some dizziness with sitting and > dizziness with standing.  No syncope, no chest pain, no SOB.   On ED arrival the Hgb was 10.5, MCV was normal.  Hgb has dropped to 8.7 today. On IV BID Protonix. coags normal.  BUN elevated to 73. Creat is 1.9.  A  CT of the abdomen ins benign.      Past Medical History  Diagnosis Date  . Hypertension   . High cholesterol   . Glaucoma     History reviewed. No pertinent past surgical history.  Prior to Admission medications   Medication Sig Start Date End Date Taking? Authorizing Provider  acetaZOLAMIDE (DIAMOX) 250 MG tablet Take 250 mg by mouth 2 (two) times daily.   Yes Historical Provider, MD  albuterol (PROVENTIL HFA;VENTOLIN HFA) 108 (90 BASE) MCG/ACT inhaler Inhale 2 puffs into the lungs every 6 (six) hours as needed for wheezing. For wheezing/shortness of breath   Yes Historical Provider, MD  beta carotene w/minerals (OCUVITE) tablet Take 1 tablet by mouth daily.   Yes Historical Provider, MD  lisinopril (PRINIVIL,ZESTRIL) 10 MG tablet Take 10 mg by mouth daily.   Yes Historical Provider, MD  Multiple Vitamin (MULTIVITAMIN WITH MINERALS) TABS Take 1 tablet by mouth daily.    Yes Historical Provider, MD  OVER THE COUNTER MEDICATION Place 1 drop into both eyes 2 (two) times daily as needed. For dry eyes Medication:OTC re-wetting drop (name unknown)   Yes Historical Provider, MD  pravastatin (PRAVACHOL) 40 MG tablet Take 40 mg by mouth every evening.   Yes Historical Provider, MD  sulfamethoxazole-trimethoprim (BACTRIM DS) 800-160 MG per tablet Take 1 tablet by mouth 2 (two) times daily. Began on 06/10/12 06/10/12  Yes Historical Provider, MD  Generic excedrin                      Once daily  Scheduled Meds: . pantoprazole (PROTONIX) IV  40 mg Intravenous Q12H   Infusions: . sodium chloride 150 mL/hr at 06/19/12 0037   PRN Meds: albuterol, ondansetron (ZOFRAN) IV, ondansetron   Allergies as of 06/18/2012  . (No Known Allergies)    No family history on file.  Pt is adopted  History   Social History  . Marital Status: Married    Spouse Name: N/A    Number of Children: N/A  . Years of Education: N/A   Occupational History  . Chemical engineeer   Social History Main Topics  . Smoking status: Never Smoker   . Smokeless tobacco:  Not on file  . Alcohol Use: No  . Drug Use: No  . Sexually Active: Yes   Other Topics Concern  . Not on file   Social History Narrative  . Travels a lot across the [place]    REVIEW OF SYSTEMS: Constitutional:  Stable weight ENT:  No nose bleeds, lots of sinus allergy sxs with headaches Pulm:  No cough or SOB CV:  No palpitations, no chest pain, no periph edema GU:  No hematuria, no frequency GI:  Per HPI.  No dysphagia Heme:  No hx anemia.   Frequent purpura on UE from minor trauma Transfusions:  none Neuro:  Sinus headaches and positional dizziness.  No visual problelms acutely Derm:  No rash, no sores, no skin cancer.  Endocrine:  No sweats .  No polyuria. Immunization:  Not queried Travel:  Lots of work related domestic [place] travel.    PHYSICAL EXAM: Vital signs in last 24 hours: Temp:  [97.9 F (36.6  C)-99 F (37.2 C)] 98 F (36.7 C) (05/25 0627) Pulse Rate:  [69-109] 70 (05/25 0627) Resp:  [16-24] 18 (05/25 0627) BP: (96-131)/(50-75) 96/50 mmHg (05/25 0627) SpO2:  [98 %-100 %] 100 % (05/25 0627) Weight:  [80.7 kg (177 lb 14.6 oz)-81.647 kg (180 lb)] 80.7 kg (177 lb 14.6 oz) (05/25 0125)  General: pleasant, looks well.  comfortable Head:  No asymmetry or swellling  Eyes:  No icterus or pallor Ears:  Not HOH  Nose:  No discharge or sneezing Mouth:  Clear and moist oral MM Neck:  No mass, no thyromegaly Lungs:  Clear.  No cough.  Not SOB Heart: RRR.  No MRG Abdomen:  Soft, ND, no mass or hernia.  No HSM, no bruits.  Slight epigastric tenderness. .   Rectal: not repeated.  FOB + and dark in ED yesterday.  Musc/Skeltl: no joint swelling or deformity Extremities:  No pedal edema  Neurologic:  Pleasant, good historian, oriented x 3.  No tremor.  No extremity weakness Skin:  No rash or sores.  + strong suntan Tattoos:  none Nodes:  No inguinal adenopathy   Psych:  Pleasant, no anxiety.   Intake/Output from previous day: 05/24 0701 - 05/25 0700 In: -  Out: 700 [Urine:700] Intake/Output this shift:    LAB RESULTS:  Recent Labs  06/18/12 1842 06/18/12 2330 06/19/12 0530  WBC 18.7* 16.5* 12.6*  HGB 10.5* 9.4* 8.7*  HCT 30.5* 27.0* 25.5*  PLT 294 241 214  MCV    90   BMET Lab Results  Component Value Date   NA 138 06/19/2012   NA 132* 06/18/2012   K 4.4 06/19/2012   K 4.0 06/18/2012   CL 112 06/19/2012   CL 104 06/18/2012   CO2 21 06/19/2012   CO2 18* 06/18/2012   GLUCOSE 118* 06/19/2012   GLUCOSE 138* 06/18/2012   BUN 48* 06/19/2012   BUN 73* 06/18/2012   CREATININE 1.50* 06/19/2012   CREATININE 1.98* 06/18/2012   CALCIUM 8.4 06/19/2012   CALCIUM 9.2 06/18/2012   LFT  Recent Labs  06/18/12 1842 06/19/12 0530  PROT 6.3 5.4*  ALBUMIN 3.3* 2.9*  AST 11 11  ALT 15 12  ALKPHOS 58 50  BILITOT 0.6 0.7   PT/INR Lab Results  Component Value Date   INR 1.07  06/19/2012   INR 1.06 06/18/2012    RADIOLOGY STUDIES: Dg Chest 2 View 06/19/2012  Findings: Cardiomediastinal silhouette is within normal limits. The lungs are clear. No pleural effusion.  No pneumothorax.  No acute osseous abnormality.  Residual contrast within nondilated colon.  IMPRESSION: No acute cardiopulmonary process.   Original Report Authenticated By: Conchita Paris, M.D.   Ct Abdomen Pelvis W Contrast 06/18/2012     Findings: Lung bases are clear.  Liver, spleen, pancreas, and adrenal glands are within normal limits.  Gallbladder is unremarkable.  No intrahepatic or extrahepatic ductal dilatation.  2 mm nonobstructing right lower pole renal calculus (series 2/image 37).  Left extrarenal pelvis.  No hydronephrosis.  No evidence of bowel obstruction.  Normal appendix.  No colonic wall thickening or inflammatory changes.  Atherosclerotic calcifications of the abdominal aorta and branch vessels.  No abdominopelvic ascites.  No suspicious abdominopelvic lymphadenopathy.  Prostate is at the upper limits of normal for size, measuring 4.9 cm in transverse dimension.  Bladder is unremarkable.  Degenerative changes of the visualized thoracolumbar spine.  Grade 1 spondylolisthesis at L5-S1.  IMPRESSION: No evidence of bowel obstruction.  Normal appendix.  No colonic wall thickening or inflammatory changes.  2 mm nonobstructing right lower pole renal calculus.  No hydronephrosis.   Original Report Authenticated By: Julian Hy, M.D.    ENDOSCOPIC STUDIES: *  Colonoscopy. Pt says about 5 years ago but I find no records of this being done at [facility] or hospital.  Got 04/2012 recall letter from Dr Fuller Plan stating he was overdue for colonoscopy in  Was due for repeat colo in around 04/2011.   IMPRESSION: *  Upper GI bleed.  Rule out ulcer from ASA *  Azotemia, suspect secndary to GI bleed.  *  Normocytic anemia.  Baseline levels not known.Hgb down nearly 2 grams since yesterday.      PLAN: *  EGD.  Will  try to arrange for today.  Pt wants to proceed ASAP.   *  Continue 2 x daily Protonix IV for now.    LOS: 1 day   Azucena Freed  06/19/2012, 9:24 AM Pager: [phone]  Attending MD note:   I have reviewed the above note, examined the patient and agree with plan of treatment. Will proceed with diagnostic EGD, he is stable from bleeding standpoint. Suspect NSAID related lesion.  [facility] Pager # [phone]

## 2012-06-19 NOTE — Op Note (Signed)
[facility] [place], [id number]   ENDOSCOPY PROCEDURE REPORT  PATIENT: Brett, Robles  MR#: [id number] BIRTHDATE: Aug 04, 1950 , 62  yrs. old GENDER: Male ENDOSCOPIST: Brett Dragon, MD REFERRED BY:  [facility] PROCEDURE DATE:  06/19/2012 PROCEDURE:  EGD w/ biopsy ASA CLASS:     Class II INDICATIONS:  Dyspepsia.   History of esophageal reflux.   Acute post hemorrhagic anemia. MEDICATIONS: These medications were titrated to patient response per physician's verbal order, Versed 5 mg IV, and Fentanyl 50 mcg IV TOPICAL ANESTHETIC: Cetacaine Spray  DESCRIPTION OF PROCEDURE: After the risks benefits and alternatives of the procedure were thoroughly explained, informed consent was obtained.  The Pentax Gastroscope [id number] endoscope was introduced through the mouth and advanced to the second portion of the duodenum. Without limitations.  The instrument was slowly withdrawn as the mucosa was fully examined.        ESOPHAGUS: The mucosa of the esophagus appeared normal. , there was no hiatal hernia  STOMACH: A single  non-bleeding shallow, triangular shaped and clean-based ulcer, ranging between 3-5 mm in size, was found in the prepyloric region of the stomach.  Biopsies were taken at edge of the ulcer.  DUODENUM: A single non-bleeding irregular shaped, shallow and clean-based ulcer, ranging between 5-87m in size, with surrounding edema was found in the duodenal bulb.at the duodenal outlet .There were no stigmata of recent bleeding. Retroflexed views revealed no abnormalities.     The scope was then withdrawn from the patient and the procedure completed.  COMPLICATIONS: There were no complications. ENDOSCOPIC IMPRESSION: 1.   The mucosa of the esophagus appeared normal 2.   Single non-bleeding ulcer, ranging between 3-5 mm in size, was found in the prepyloric region of the stomach 3.   Single non-bleeding ulcer, ranging  between 5-921min size, was found in the duodenal bulb 4.   There was no blood in the stomach 5.   Biopsies taken to r/o H.Pylori  RECOMMENDATIONS: Await pathology results clear liquids avoid NSAID's, PPI long term if he is planning on using AQSA/NSAIDs  REPEAT EXAM: no recall  eSigned:  DoLafayette DragonMD 06/19/2012 2:09 PM   CC:  PATIENT NAME:  Brett Robles, MackeR#: [id number]

## 2012-06-19 NOTE — ED Provider Notes (Signed)
I saw and evaluated the patient, reviewed the resident's note and I agree with the findings and plan.  Leota Jacobsen, MD 06/19/12 [phone]

## 2012-06-20 DIAGNOSIS — E871 Hypo-osmolality and hyponatremia: Secondary | ICD-10-CM

## 2012-06-20 DIAGNOSIS — D72829 Elevated white blood cell count, unspecified: Secondary | ICD-10-CM

## 2012-06-20 DIAGNOSIS — R7989 Other specified abnormal findings of blood chemistry: Secondary | ICD-10-CM

## 2012-06-20 LAB — CBC
HCT: 22.3 % — ABNORMAL LOW (ref 39.0–52.0)
Hemoglobin: 7.1 g/dL — ABNORMAL LOW (ref 13.0–17.0)
MCH: 31.3 pg (ref 26.0–34.0)
MCH: 32.1 pg (ref 26.0–34.0)
MCHC: 34.2 g/dL (ref 30.0–36.0)
MCHC: 34.5 g/dL (ref 30.0–36.0)
MCV: 91.4 fL (ref 78.0–100.0)
Platelets: 185 10*3/uL (ref 150–400)
Platelets: 188 10*3/uL (ref 150–400)
Platelets: 193 10*3/uL (ref 150–400)
RBC: 2.43 MIL/uL — ABNORMAL LOW (ref 4.22–5.81)
RBC: 2.21 MIL/uL — ABNORMAL LOW (ref 4.22–5.81)
RDW: 14 % (ref 11.5–15.5)
RDW: 13.9 % (ref 11.5–15.5)
WBC: 8.4 10*3/uL (ref 4.0–10.5)
WBC: 9.2 10*3/uL (ref 4.0–10.5)
WBC: 9.2 10*3/uL (ref 4.0–10.5)

## 2012-06-20 LAB — BASIC METABOLIC PANEL
BUN: 24 mg/dL — ABNORMAL HIGH (ref 6–23)
CO2: 21 mEq/L (ref 19–32)
Chloride: 112 mEq/L (ref 96–112)
Creatinine, Ser: 1.13 mg/dL (ref 0.50–1.35)
GFR calc Af Amer: 78 mL/min — ABNORMAL LOW (ref >90)
GFR calc non Af Amer: 67 mL/min — ABNORMAL LOW (ref >90)

## 2012-06-20 MED ORDER — SODIUM CHLORIDE 0.9 % IV SOLN
INTRAVENOUS | Status: DC
  Administered 2012-06-20: 16:00:00 via INTRAVENOUS

## 2012-06-20 MED ORDER — PANTOPRAZOLE SODIUM 40 MG PO TBEC
40.0000 mg | DELAYED_RELEASE_TABLET | Freq: Two times a day (BID) | ORAL | Status: DC
  Administered 2012-06-20 – 2012-06-21 (×2): 40 mg via ORAL
  Filled 2012-06-20 (×2): qty 1

## 2012-06-20 MED ORDER — ACETAZOLAMIDE 250 MG PO TABS
250.0000 mg | ORAL_TABLET | Freq: Two times a day (BID) | ORAL | Status: DC
  Administered 2012-06-20 – 2012-06-21 (×2): 250 mg via ORAL
  Filled 2012-06-20 (×3): qty 1

## 2012-06-20 MED ORDER — ACETAMINOPHEN 325 MG PO TABS: 650.0000 mg | ORAL_TABLET | Freq: Four times a day (QID) | ORAL | Status: DC | PRN

## 2012-06-20 NOTE — Progress Notes (Signed)
[facility] Progress Note   Subjective  No complaints, no stool, feels weak, tolerated full liquids   Objective   Vital signs in last 24 hours: Temp:  [97.2 F (36.2 C)-98.5 F (36.9 C)] 97.8 F (36.6 C) (05/26 0603) Pulse Rate:  [71-83] 75 (05/26 0603) Resp:  [12-20] 18 (05/26 0603) BP: (93-133)/(50-82) 127/64 mmHg (05/26 0603) SpO2:  [97 %-100 %] 99 % (05/26 0603) Last BM Date: 06/19/12 General:    white male in NAD Heart:  Regular rate and rhythm; no murmurs Lungs: Respirations even and unlabored, lungs CTA bilaterally Abdomen:  Soft, nontender and nondistended. Normal bowel sounds. Extremities:  Without edema. Neurologic:  Alert and oriented,  grossly normal neurologically. Psych:  Cooperative. Normal mood and affect.  Intake/Output from previous day: 05/25 0701 - 05/26 0700 In: 660 [P.O.:360; I.V.:300] Out: 3200 [Urine:3200] Intake/Output this shift: Total I/O In: -  Out: 300 [Urine:300]  Lab Results:  Recent Labs  06/19/12 1456 06/19/12 2223 06/20/12 0728  WBC 9.2 8.0 8.4  HGB 8.2* 8.0* 7.6*  HCT 23.9* 23.8* 22.2*  PLT 191 192 185   BMET  Recent Labs  06/18/12 1842 06/19/12 0530  NA 132* 138  K 4.0 4.4  CL 104 112  CO2 18* 21  GLUCOSE 138* 118*  BUN 73* 48*  CREATININE 1.98* 1.50*  CALCIUM 9.2 8.4   LFT  Recent Labs  06/19/12 0530  PROT 5.4*  ALBUMIN 2.9*  AST 11  ALT 12  ALKPHOS 50  BILITOT 0.7   PT/INR  Recent Labs  06/18/12 1842 06/19/12 0530  LABPROT 13.7 13.8  INR 1.06 1.07    Studies/Results: Dg Chest 2 View  06/19/2012   *RADIOLOGY REPORT*  Clinical Data: Shortness of breath  CHEST - 2 VIEW  Comparison: None.  Findings: Cardiomediastinal silhouette is within normal limits. The lungs are clear. No pleural effusion.  No pneumothorax.  No acute osseous abnormality.  Residual contrast within nondilated colon.  IMPRESSION: No acute cardiopulmonary process.   Original Report Authenticated By: Conchita Paris,  M.D.   Ct Abdomen Pelvis W Contrast  06/18/2012   *RADIOLOGY REPORT*  Clinical Data: GI bleed  CT ABDOMEN AND PELVIS WITH CONTRAST  Technique:  Multidetector CT imaging of the abdomen and pelvis was performed following the standard protocol during bolus administration of intravenous contrast.  Contrast: 94m OMNIPAQUE IOHEXOL 300 MG/ML  SOLN  Comparison: None.  Findings: Lung bases are clear.  Liver, spleen, pancreas, and adrenal glands are within normal limits.  Gallbladder is unremarkable.  No intrahepatic or extrahepatic ductal dilatation.  2 mm nonobstructing right lower pole renal calculus (series 2/image 37).  Left extrarenal pelvis.  No hydronephrosis.  No evidence of bowel obstruction.  Normal appendix.  No colonic wall thickening or inflammatory changes.  Atherosclerotic calcifications of the abdominal aorta and branch vessels.  No abdominopelvic ascites.  No suspicious abdominopelvic lymphadenopathy.  Prostate is at the upper limits of normal for size, measuring 4.9 cm in transverse dimension.  Bladder is unremarkable.  Degenerative changes of the visualized thoracolumbar spine.  Grade 1 spondylolisthesis at L5-S1.  IMPRESSION: No evidence of bowel obstruction.  Normal appendix.  No colonic wall thickening or inflammatory changes.  2 mm nonobstructing right lower pole renal calculus.  No hydronephrosis.   Original Report Authenticated By: SJulian Hy M.D.       Assessment:  S/p major UGIB due to gastric and duodenal ulcers, bleeding has stopped and he is stable but Hgb is still equilibrating to 7.6 today.  H,Pylori test is pending but I suspect the ulcers are NSAID induced. OK to switch to oral PPI. OK to discharge from GI standpoint, unless there are other issues     Plan:  Advance diet Oral PPI Avoid NSAIDs OTC iron supplements outpatient F/u DR Deatra Ina, may be due for recall colonoscopy  Principal Problem:   GI bleed Active Problems:   Acute kidney injury   Dehydration    Melena   Hyponatremia   Leukocytosis   Acute blood loss anemia   Azotemia   Duodenal ulcer due to nonsteroidal anti-inflammatory drug (NSAID)   Gastric ulcer     LOS: 2 days   Delfin Edis  06/20/2012, 9:24 AM

## 2012-06-20 NOTE — Progress Notes (Signed)
Subjective: Pt tolerated EGD on 7/01 w/o complication. Pt feels well this AM. No repeat black stools. No palpitations or tachycardia.   Objective: Vital signs in last 24 hours: Filed Vitals:   06/19/12 1741 06/19/12 2209 06/20/12 0144 06/20/12 0603  BP: 112/63 124/61 133/74 127/64  Pulse: 83 78 72 75  Temp: 97.2 F (36.2 C) 98 F (36.7 C) 97.9 F (36.6 C) 97.8 F (36.6 C)  TempSrc:  Oral Oral Oral  Resp: 20 18 18 18   Height:      Weight:      SpO2: 100% 97% 98% 99%   Weight change:   Intake/Output Summary (Last 24 hours) at 06/20/12 0743 Last data filed at 06/20/12 0603  Gross per 24 hour  Intake    660 ml  Output   3200 ml  Net  -2540 ml   General: resting in bed HEENT: PERRL, EOMI, no scleral icterus Cardiac: RRR, no rubs, murmurs or gallops Pulm: clear to auscultation bilaterally, moving normal volumes of air Abd: soft, nontender, nondistended, BS present Ext: warm and well perfused, no pedal edema Neuro: alert and oriented X3, cranial nerves II-XII grossly intact  Lab Results: Basic Metabolic Panel:  Recent Labs Lab 06/18/12 1842 06/19/12 0530  NA 132* 138  K 4.0 4.4  CL 104 112  CO2 18* 21  GLUCOSE 138* 118*  BUN 73* 48*  CREATININE 1.98* 1.50*  CALCIUM 9.2 8.4   Liver Function Tests:  Recent Labs Lab 06/18/12 1842 06/19/12 0530  AST 11 11  ALT 15 12  ALKPHOS 58 50  BILITOT 0.6 0.7  PROT 6.3 5.4*  ALBUMIN 3.3* 2.9*    Recent Labs Lab 06/18/12 1842  LIPASE 68*   CBC:  Recent Labs Lab 06/18/12 1842  06/19/12 1456 06/19/12 2223  WBC 18.7*  < > 9.2 8.0  NEUTROABS 14.5*  --   --   --   HGB 10.5*  < > 8.2* 8.0*  HCT 30.5*  < > 23.9* 23.8*  MCV 89.7  < > 90.9 91.2  PLT 294  < > 191 192  < > = values in this interval not displayed. Coagulation:  Recent Labs Lab 06/18/12 1842 06/19/12 0530  LABPROT 13.7 13.8  INR 1.06 1.07   Anemia Panel:  Recent Labs Lab 06/19/12 0052  RETICCTPCT 2.0   Urinalysis:  Recent Labs Lab  06/18/12 2024  COLORURINE YELLOW  LABSPEC 1.015  PHURINE 5.5  GLUCOSEU NEGATIVE  HGBUR NEGATIVE  BILIRUBINUR NEGATIVE  KETONESUR NEGATIVE  PROTEINUR NEGATIVE  UROBILINOGEN 0.2  NITRITE NEGATIVE  LEUKOCYTESUR NEGATIVE    Micro Results: No results found for this or any previous visit (from the past 240 hour(s)). Studies/Results: Dg Chest 2 View  06/19/2012   *RADIOLOGY REPORT*  Clinical Data: Shortness of breath  CHEST - 2 VIEW  Comparison: None.  Findings: Cardiomediastinal silhouette is within normal limits. The lungs are clear. No pleural effusion.  No pneumothorax.  No acute osseous abnormality.  Residual contrast within nondilated colon.  IMPRESSION: No acute cardiopulmonary process.   Original Report Authenticated By: Conchita Paris, M.D.   Ct Abdomen Pelvis W Contrast  06/18/2012   *RADIOLOGY REPORT*  Clinical Data: GI bleed  CT ABDOMEN AND PELVIS WITH CONTRAST  Technique:  Multidetector CT imaging of the abdomen and pelvis was performed following the standard protocol during bolus administration of intravenous contrast.  Contrast: 49m OMNIPAQUE IOHEXOL 300 MG/ML  SOLN  Comparison: None.  Findings: Lung bases are clear.  Liver, spleen, pancreas, and  adrenal glands are within normal limits.  Gallbladder is unremarkable.  No intrahepatic or extrahepatic ductal dilatation.  2 mm nonobstructing right lower pole renal calculus (series 2/image 37).  Left extrarenal pelvis.  No hydronephrosis.  No evidence of bowel obstruction.  Normal appendix.  No colonic wall thickening or inflammatory changes.  Atherosclerotic calcifications of the abdominal aorta and branch vessels.  No abdominopelvic ascites.  No suspicious abdominopelvic lymphadenopathy.  Prostate is at the upper limits of normal for size, measuring 4.9 cm in transverse dimension.  Bladder is unremarkable.  Degenerative changes of the visualized thoracolumbar spine.  Grade 1 spondylolisthesis at L5-S1.  IMPRESSION: No evidence of bowel  obstruction.  Normal appendix.  No colonic wall thickening or inflammatory changes.  2 mm nonobstructing right lower pole renal calculus.  No hydronephrosis.   Original Report Authenticated By: Julian Hy, M.D.   Medications: I have reviewed the patient's current medications. Scheduled Meds: . pantoprazole (PROTONIX) IV  40 mg Intravenous Q12H   Continuous Infusions:   PRN Meds:.acetaminophen, albuterol, ondansetron (ZOFRAN) IV, ondansetron Assessment/Plan: # GI Bleed - Hb = 10.5 >>7.4. FOBT positive in the ED. Signs/symptoms are most consistent with peptic ulcer disease, particularly in the context of the patient's history of chronic aspirin usage, and her recent prednisone usage. Pt is visibly jaundiced, which he notes as a new finding as well, however total bili is wnl (of note, he takes beta-carotene). Lipase slightly elevated. CT abdomen was negative for any acute findings. Ischemic colitis cannot be ruled out, however is unlikely given his complaints have persisted for several days and he has remained stable throughout that period of time.  - transfer to stepdown as Hgb continues to drop - Monitor vitals  - Protonix IV bid  - trend CBCs q8h  - GI consulted much appreciated EGD on 5/25 with 2 small non-bleeding ulcers in prepyloric region and duodenal bulb, bx taken and pending -clear diet and advance per GI  # Acute kidney injury - Cr = 1.98 >>1.5 with IVF blouses. No baseline on record, but pt denies CKD). AKI most likely secondary to prerenal azotemia in the setting of decreased PO intake over the last few days as is further supported by the presence of orthostatic hypotension. He received total of 3L bolus.   - NS at 75 cc/hr as pt tolerating clear diet - check BMET in AM   # SOB -resolving: likely secondary to anemia associated with GI bleed. Worsened asthma could be contributing, although he was CTA on exam. Pt travels regularly from [place] to [place] on business, but denies any  recent leg swelling or CP, SOB has been exertional, and revised geneva = 3 (low probability) making PE less likely. O2 sats 100% on RA.  - continue to monitor symptoms.   # Leukocytosis-resolved - WBC = 18.7>>8.4. Likely secondary to ongoing prednisone usage and previous hemoconcentration.  - d/c prednisone in setting of GI bleed  - check CBC in AM   Hypotension -improving: SBP 90/50 on arrival with IVF and this AM 120s/60s. Pt has hx of HTN but holding ACE-I in setting of AKI and hypotension.   Dispo: Disposition is deferred at this time, awaiting improvement of current medical problems.  Anticipated discharge in approximately 1-2 day(s).   The patient does have a current PCP (COLLINS, DANA, DO), therefore will be requiring OPC follow-up after discharge.   The patient does not have transportation limitations that hinder transportation to clinic appointments.  .Services Needed at time of discharge: Y =  Yes, Blank = No PT:   OT:   RN:   Equipment:   Other:     LOS: 2 days   Clinton Gallant 06/20/2012, 7:43 AM Pgr: [phone]

## 2012-06-21 ENCOUNTER — Encounter: Payer: Self-pay | Admitting: Gastroenterology

## 2012-06-21 LAB — BASIC METABOLIC PANEL
CO2: 23 mEq/L (ref 19–32)
Calcium: 8.6 mg/dL (ref 8.4–10.5)
Chloride: 108 mEq/L (ref 96–112)
GFR calc Af Amer: 72 mL/min — ABNORMAL LOW (ref >90)
Glucose, Bld: 106 mg/dL — ABNORMAL HIGH (ref 70–99)
Potassium: 3.8 mEq/L (ref 3.5–5.1)
Sodium: 139 mEq/L (ref 135–145)

## 2012-06-21 LAB — CBC
Hemoglobin: 6.7 g/dL — CL (ref 13.0–17.0)
Hemoglobin: 8.4 g/dL — ABNORMAL LOW (ref 13.0–17.0)
MCH: 30.9 pg (ref 26.0–34.0)
MCHC: 34.4 g/dL (ref 30.0–36.0)
MCV: 91.2 fL (ref 78.0–100.0)
Platelets: 161 10*3/uL (ref 150–400)
RBC: 2.17 MIL/uL — ABNORMAL LOW (ref 4.22–5.81)
RBC: 2.72 MIL/uL — ABNORMAL LOW (ref 4.22–5.81)
WBC: 8.7 10*3/uL (ref 4.0–10.5)
WBC: 11.5 10*3/uL — ABNORMAL HIGH (ref 4.0–10.5)

## 2012-06-21 MED ORDER — PANTOPRAZOLE SODIUM 40 MG PO TBEC: 40.0000 mg | DELAYED_RELEASE_TABLET | Freq: Two times a day (BID) | ORAL | Status: DC

## 2012-06-21 NOTE — Care Management Note (Signed)
  Page 1 of 1   06/21/2012     3:02:58 PM   CARE MANAGEMENT NOTE 06/21/2012  Patient:  Brett Robles, Brett Robles   Account Number:  [id number]  Date Initiated:  06/21/2012  Documentation initiated by:  Magdalen Spatz  Subjective/Objective Assessment:     Action/Plan:   Anticipated DC Date:  06/22/2012   Anticipated DC Plan:  HOME/SELF CARE         Choice offered to / List presented to:             Status of service:   Medicare Important Message given?   (If response is "NO", the following Medicare IM given date fields will be blank) Date Medicare IM given:   Date Additional Medicare IM given:    Discharge Disposition:    Per UR Regulation:    If discussed at Long Length of Stay Meetings, dates discussed:    Comments:  06-21-12 Referreal to assist patinet with finding PCP . Patient's current PCP is not in network with his insurance plan .  Explained to patient he can call phone number on insurance card and be provided with a list of PCP's in his are that are in network . Provided patinet with list of Primary Care Resources. Also provided patient with [facility] number ( if he wants  [facility] ) .  If patinet knows of a MD that he would like , he can call that MD's office directly and see if they are accepting new patinet's and his insurance.   Patinet voiced understanding.  Magdalen Spatz RN BSN [phone]

## 2012-06-21 NOTE — Clinical Social Work Note (Signed)
Clinical Social Worker received inappropriate consult for finding a PCP for patient. CSW will sign off, as social work intervention is no longer needed.   Leandro Reasoner MSW, [place]

## 2012-06-21 NOTE — Progress Notes (Signed)
CBC results - hgb 8.4 hct 24.4. M.D. Notified of earlier order for discharge.

## 2012-06-21 NOTE — Discharge Summary (Signed)
[facility] Discharge Note  Name: Brett Robles MRN: [id number] DOB: 1950-02-27 62 y.o.  Date of Admission: 06/18/2012  6:11 PM Date of Discharge: 06/21/2012 Attending Physician: Brett Crews, MD  Discharge Diagnosis: Principal Problem:   GI bleed Active Problems:   Acute kidney injury   Dehydration   Melena   Hyponatremia   Leukocytosis   Acute blood loss anemia   Azotemia   Duodenal ulcer due to nonsteroidal anti-inflammatory drug (NSAID)   Gastric ulcer   Discharge Medications:   Medication List    TAKE these medications       acetaZOLAMIDE 250 MG tablet  Commonly known as:  DIAMOX  Take 250 mg by mouth 2 (two) times daily.     albuterol 108 (90 BASE) MCG/ACT inhaler  Commonly known as:  PROVENTIL HFA;VENTOLIN HFA  Inhale 2 puffs into the lungs every 6 (six) hours as needed for wheezing. For wheezing/shortness of breath     beta carotene w/minerals tablet  Take 1 tablet by mouth daily.     lisinopril 10 MG tablet  Commonly known as:  PRINIVIL,ZESTRIL  Take 10 mg by mouth daily.     multivitamin with minerals Tabs  Take 1 tablet by mouth daily.     OVER THE COUNTER MEDICATION  Place 1 drop into both eyes 2 (two) times daily as needed. For dry eyes  Medication:OTC re-wetting drop (name unknown)     pantoprazole 40 MG tablet  Commonly known as:  PROTONIX  Take 1 tablet (40 mg total) by mouth 2 (two) times daily.     pravastatin 40 MG tablet  Commonly known as:  PRAVACHOL  Take 40 mg by mouth every evening.     sulfamethoxazole-trimethoprim 800-160 MG per tablet  Commonly known as:  BACTRIM DS  Take 1 tablet by mouth 2 (two) times daily. Began on 06/10/12        Disposition and follow-up:   Brett Robles was discharged from [facility] in Stable condition.  At the hospital follow up visit please address: 1. CBC for Hgb 2. colonoscopy  Follow-up Appointments:      Discharge Orders    Future Appointments Provider Department Dept Phone   07/18/2012 9:00 AM Brett Artist, MD [facility] [phone]   Future Orders Complete By Expires     Increase activity slowly  As directed        Consultations: Treatment Team:  Brett Banister, MD  Procedures Performed:  Dg Chest 2 View  06/19/2012   *RADIOLOGY REPORT*  Clinical Data: Shortness of breath  CHEST - 2 VIEW  Comparison: None.  Findings: Cardiomediastinal silhouette is within normal limits. The lungs are clear. No pleural effusion.  No pneumothorax.  No acute osseous abnormality.  Residual contrast within nondilated colon.  IMPRESSION: No acute cardiopulmonary process.   Original Report Authenticated By: Brett Robles, M.D.   Ct Abdomen Pelvis W Contrast  06/18/2012   *RADIOLOGY REPORT*  Clinical Data: GI bleed  CT ABDOMEN AND PELVIS WITH CONTRAST  Technique:  Multidetector CT imaging of the abdomen and pelvis was performed following the standard protocol during bolus administration of intravenous contrast.  Contrast: 72m OMNIPAQUE IOHEXOL 300 MG/ML  SOLN  Comparison: None.  Findings: Lung bases are clear.  Liver, spleen, pancreas, and adrenal glands are within normal limits.  Gallbladder is unremarkable.  No intrahepatic or extrahepatic ductal dilatation.  2 mm nonobstructing right lower pole renal calculus (series 2/image 37).  Left extrarenal pelvis.  No hydronephrosis.  No evidence of bowel obstruction.  Normal appendix.  No colonic wall thickening or inflammatory changes.  Atherosclerotic calcifications of the abdominal aorta and branch vessels.  No abdominopelvic ascites.  No suspicious abdominopelvic lymphadenopathy.  Prostate is at the upper limits of normal for size, measuring 4.9 cm in transverse dimension.  Bladder is unremarkable.  Degenerative changes of the visualized thoracolumbar spine.  Grade 1 spondylolisthesis at L5-S1.  IMPRESSION: No evidence of bowel obstruction.  Normal appendix.  No  colonic wall thickening or inflammatory changes.  2 mm nonobstructing right lower pole renal calculus.  No hydronephrosis.   Original Report Authenticated By: Brett Robles, M.D.    Admission HPI: [name] is a 62 yo man with PMH of glaucoma, HTN, HLD, who [facility] ED with complaints of melena. He states that on the evening 2 days prior to admission he had a bowel movement of soft, brown stool which turned into black, tarry, loose stool by completion. He awoke the next morning and felt lightheaded and short of breath upon standing. He subsequently had another black, tarry bowel movement which was more watery in consistency than the previous one. He has continued to have black, tarry BMs since that time, and states that he has avoided eating any food, or drinking any liquids (other than water) since his symptoms began, due to concern that these would worsen the issue. He reports nausea but no vomiting. He admits to abdominal discomfort which he describes as a "gnawing" sensation which has worsened over the last couple of weeks--he takes tums for this issue and has recently increased his use of this medication since the issue worsened (4 tums daily). No recent sick contacts. He admits to taking two 325 mg aspirin tablets yesterday for a headache and admits to taking aspirin occasionally as needed for headache in the past.  The patient has a history of asthma and seasonal allergies. He states that for the last 2 weeks he has had worsened allergy/asthma symptoms, and was prescribed Bactrim and prednisone by his PCP approximately one week ago for this issue. He also has had decreased energy for the last 2 weeks, and has been feeling short of breath for the same period of time (although his SOB is more severely worsened over the last 2 days, per above).  Patient had a colonoscopy in 2008 at [facility] (Brett Robles) which was revealing of an adenomatous polyp for which he is due for a repeat  colonoscopy.   Hospital Course by problem list: # GI Bleed w/acute blood loss anemia- Hb = 10.5 >>6.7 throughout admission. FOBT positive in the ED. Signs/symptoms are most consistent with peptic ulcer disease, particularly in the context of the patient's history of chronic aspirin usage, and recent prednisone usage. Pt is visibly jaundiced, which he notes as a new finding as well, however total bili is wnl (of note, he takes beta-carotene). Lipase was slightly elevated but CT abdomen was negative for any acute findings. GI consulted performed EGD on 5/25 showed 2 small non-bleeding ulcers in prepyloric region and duodenal bulb, bx taken and were pending at d/c. Pt never decompensated but continued to have some drop in Hgb and recieved a total of 1 PRBC transfusion and upon d/c Hgb 8.1 with close f/u with PCP and f/u with GI to pursue colonoscopy as outpt.   # Acute kidney injury resolved- Cr on admission 1.98 >>1.2 with IVF blouses. No baseline on record, but pt  denies CKD). AKI most likely secondary to prerenal azotemia in the setting of decreased PO intake over the last few days as is further supported by the presence of orthostatic hypotension. He received total of 3L bolus that normalized Cr.   # SOB: likely secondary to anemia associated with GI bleed. Worsened asthma could be contributing, although he no exam findings. Pt travels regularly from [place] to [place] on business, but denies any recent leg swelling or CP, SOB has been exertional, and revised geneva = 3 (low probability) making PE less likely. O2 sats 100% on RA.    #Hypotension: SBP 90/50 on arrival but normalized with IVF hydrations. Pt has hx of HTN but ACE-I was held in setting of AKI and hypotension. This was restarted at d/c.    Discharge Vitals:  BP 149/78  Pulse 89  Temp(Src) 97.7 F (36.5 C) (Oral)  Resp 20  Ht 5' 8"  (1.727 m)  Wt 177 lb 14.6 oz (80.7 kg)  BMI 27.06 kg/m2  SpO2 100% General: resting in bed, NAD  HEENT:  PERRL, EOMI, no scleral icterus  Cardiac: RRR, no rubs, murmurs or gallops  Pulm: clear to auscultation bilaterally, moving normal volumes of air  Abd: soft, nontender, nondistended, BS present  Ext: warm and well perfused, no pedal edema, generalized yellow pigmentation to skin  Neuro: alert and oriented X3, cranial nerves II-XII grossly intact  Discharge Labs:  Results for orders placed during the hospital encounter of 06/18/12 (from the past 24 hour(s))  CBC     Status: Abnormal   Collection Time    06/20/12  9:07 PM      Result Value Range   WBC 9.2  4.0 - 10.5 K/uL   RBC 2.21 (*) 4.22 - 5.81 MIL/uL   Hemoglobin 7.1 (*) 13.0 - 17.0 g/dL   HCT 20.2 (*) 39.0 - 52.0 %   MCV 91.4  78.0 - 100.0 fL   MCH 32.1  26.0 - 34.0 pg   MCHC 35.1  30.0 - 36.0 g/dL   RDW 13.9  11.5 - 15.5 %   Platelets 193  150 - 400 K/uL  BASIC METABOLIC PANEL     Status: Abnormal   Collection Time    06/21/12  4:15 AM      Result Value Range   Sodium 139  135 - 145 mEq/L   Potassium 3.8  3.5 - 5.1 mEq/L   Chloride 108  96 - 112 mEq/L   CO2 23  19 - 32 mEq/L   Glucose, Bld 106 (*) 70 - 99 mg/dL   BUN 20  6 - 23 mg/dL   Creatinine, Ser 1.20  0.50 - 1.35 mg/dL   Calcium 8.6  8.4 - 10.5 mg/dL   GFR calc non Af Amer 62 (*) >90 mL/min   GFR calc Af Amer 72 (*) >90 mL/min  CBC     Status: Abnormal   Collection Time    06/21/12  4:15 AM      Result Value Range   WBC 8.7  4.0 - 10.5 K/uL   RBC 2.17 (*) 4.22 - 5.81 MIL/uL   Hemoglobin 6.7 (*) 13.0 - 17.0 g/dL   HCT 19.8 (*) 39.0 - 52.0 %   MCV 91.2  78.0 - 100.0 fL   MCH 30.9  26.0 - 34.0 pg   MCHC 33.8  30.0 - 36.0 g/dL   RDW 13.8  11.5 - 15.5 %   Platelets 161  150 - 400 K/uL  PREPARE RBC (  CROSSMATCH)     Status: None   Collection Time    06/21/12  5:42 AM      Result Value Range   Order Confirmation ORDER PROCESSED BY BLOOD BANK      Signed: Clinton Gallant 06/21/2012, 4:10 PM   Time Spent on Discharge: 25 min Services Ordered on Discharge:  none Equipment Ordered on Discharge: none

## 2012-06-21 NOTE — Progress Notes (Signed)
Discharge instructions reviewed with patient. Questions answered re: prescription. f/u appointment, and when to call the doctor. Printed AVS given to patient. Written education material on peptic ulcer and iron-rich diet given earlier. Patient verbalizes understanding. Printed AVS given to patient. Discharged to home via wheelchair. Accompanied by wife.

## 2012-06-21 NOTE — Progress Notes (Signed)
[facility] Daily Rounding Note 06/21/2012, 11:03 AM  SUBJECTIVE:       No stools since Saturday, when it was dark.  No nausea, tolerating solids.  No abdominal pain.  No dizziness.   OBJECTIVE:         Vital signs in last 24 hours:    Temp:  [97.9 F (36.6 C)-99.2 F (37.3 C)] 97.9 F (36.6 C) (05/27 1011) Pulse Rate:  [82-104] 87 (05/27 1011) Resp:  [16-20] 18 (05/27 1011) BP: (128-156)/(64-79) 128/67 mmHg (05/27 1011) SpO2:  [98 %-100 %] 98 % (05/27 1011) Last BM Date: 06/20/12 General: looks  Well.    Heart: RRR Chest: clear B. No resp distress Abdomen: soft, active BS, NT, ND  Extremities: no CCE Neuro/Psych:  Oriented x 3.    Intake/Output from previous day: 05/26 0701 - 05/27 0700 In: 200 [P.O.:200] Out: 600 [Urine:600]  Intake/Output this shift: Total I/O In: 357.5 [P.O.:240; I.V.:20; Blood:97.5] Out: 275 [Urine:275]  Lab Results:  Recent Labs  06/20/12 1410 06/20/12 2107 06/21/12 0415  WBC 9.2 9.2 8.7  HGB 7.7* 7.1* 6.7*  HCT 22.3* 20.2* 19.8*  PLT 188 193 161  MCV    91  BMET  Recent Labs  06/19/12 0530 06/20/12 0946 06/21/12 0415  NA 138 141 139  K 4.4 4.0 3.8  CL 112 112 108  CO2 21 21 23   GLUCOSE 118* 98 106*  BUN 48* 24* 20  CREATININE 1.50* 1.13 1.20  CALCIUM 8.4 8.2* 8.6   LFT  Recent Labs  06/18/12 1842 06/19/12 0530  PROT 6.3 5.4*  ALBUMIN 3.3* 2.9*  AST 11 11  ALT 15 12  ALKPHOS 58 50  BILITOT 0.6 0.7   PT/INR  Recent Labs  06/18/12 1842 06/19/12 0530  LABPROT 13.7 13.8  INR 1.06 1.07    Studies/Results: Called pathology dept today.  Pt had adenomatous polyp of cecum in 04/2006.  Never came back for repeat study despite letters in 2013 and 2014.  Unable to locate the colonoscopy report however.   EGD  06/19/12 1. The mucosa of the esophagus appeared normal  2. Single non-bleeding ulcer, ranging between 3-5 mm in size, was  found in the prepyloric region of the stomach  3. Single non-bleeding ulcer,  ranging between 5-44m in size, was  found in the duodenal bulb  4. There was no blood in the stomach  5. Biopsies taken to r/o H.Pylori  CT scan Ab/Pelvis  06/18/12 IMPRESSION: No evidence of bowel obstruction. Normal appendix. No colonic wall thickening or inflammatory changes. 2 mm nonobstructing right lower pole renal calculus. No hydronephrosis.   ASSESMENT: *  GI bleed.   EGD 06/19/12: non-bleeding ulcers.  Pathology pending.  Was taking once daily Excedrin PTA.  On BID oral Protonix.  Was on IV Protonix.   *  Normocytic anemia.  Hgb is down nearly 5 grams since admission (was 10.5).  Getting one of one units PRBCs now. Pt not symptomatic. *  Renal insufficiency, resolved.  Suggests dehydration.  Correction of this is adding dilutional effect to the H & H.  *  Adenomatous colon polyps 2008.  Overdue for colonoscopy  PLAN: *  Discussed with Dr JArdis Hughs  Observe pt for now.  Still plan for outpt colonoscopy.  No repeat EGD planned.  Follow H & H.  I will arrange appt    LOS: 3 days   SAzucena Freed 06/21/2012, 11:03 AM Pager: [phone]  ________________________________________________________________________  LVelora HecklerGI MD  note:  I personally examined the patient, reviewed the data and agree with the assessment and plan described above.  He had single dark stool around lunch, otherwise feels great.  Had 1 unit prbc this AM.  HE was taking 530m ASA daily and then 2 pulse doses of prednisone.  No PPI.  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  He knows to avoid ASA and other NSAIDs, use tylenol for pains, stay on PPI once daily and iron supplement (OTC) once daily until seen again in our office in about 6 weeks. We will set up that appt.  At that point, will discuss repeat EGD to confirm healing of GU/DU and also perform surveillance, screening colonoscopy if indicated at same time.   DOwens Loffler MD LGood [facility]itan [facility] Center[facility]astroenterology Pager [phone]

## 2012-06-21 NOTE — Progress Notes (Signed)
Internal Medicine Teaching Service Attending Note Date: 06/21/2012  Patient name: Brett Robles  Medical record number: [id number]  Date of birth: 12-23-1950    This patient has been seen and discussed with the house staff. Please see their note for complete details. I concur with their findings with the following additions/corrections: Mr Bentz was standing and walking and was without dizziness. He is anxious to leave to go home. PRBC ended 1230 today so will move up the CBC and if OK can go home. Dr Ardis Hughs has seen and has no further rec other than outpt F/U.   CBC D/C if see expected increase in HgB  [facility] 06/21/2012, 4:28 PM

## 2012-06-21 NOTE — Progress Notes (Signed)
Subjective: Pt had only one episode of tachycardia yesterday describes that was anxious when heard news wasn't able to go home yesterday. No gross blood noticed, no repeat black tarry stools, no BRBPR, nausea/vomiting/diarrhea. No pain, SOB, HA, palpitations, DOE noted this AM or overnight.   Objective: Vital signs in last 24 hours: Filed Vitals:   06/20/12 2150 06/21/12 0629 06/21/12 0830 06/21/12 0900  BP: 154/79 142/68 139/69 135/64  Pulse: 91 82 99 83  Temp: 98.1 F (36.7 C) 98.1 F (36.7 C) 98.4 F (36.9 C) 98.2 F (36.8 C)  TempSrc: Oral Oral Oral Oral  Resp: 16 16 20 20   Height:      Weight:      SpO2: 100% 100%     Weight change:   Intake/Output Summary (Last 24 hours) at 06/21/12 0937 Last data filed at 06/21/12 0900  Gross per 24 hour  Intake  557.5 ml  Output    300 ml  Net  257.5 ml   General: resting in bed, NAD HEENT: PERRL, EOMI, no scleral icterus Cardiac: RRR, no rubs, murmurs or gallops Pulm: clear to auscultation bilaterally, moving normal volumes of air Abd: soft, nontender, nondistended, BS present Ext: warm and well perfused, no pedal edema, generalized yellow pigmentation to skin Neuro: alert and oriented X3, cranial nerves II-XII grossly intact  Lab Results: Basic Metabolic Panel:  Recent Labs Lab 06/20/12 0946 06/21/12 0415  NA 141 139  K 4.0 3.8  CL 112 108  CO2 21 23  GLUCOSE 98 106*  BUN 24* 20  CREATININE 1.13 1.20  CALCIUM 8.2* 8.6   Liver Function Tests:  Recent Labs Lab 06/18/12 1842 06/19/12 0530  AST 11 11  ALT 15 12  ALKPHOS 58 50  BILITOT 0.6 0.7  PROT 6.3 5.4*  ALBUMIN 3.3* 2.9*    Recent Labs Lab 06/18/12 1842  LIPASE 68*   CBC:  Recent Labs Lab 06/18/12 1842  06/20/12 2107 06/21/12 0415  WBC 18.7*  < > 9.2 8.7  NEUTROABS 14.5*  --   --   --   HGB 10.5*  < > 7.1* 6.7*  HCT 30.5*  < > 20.2* 19.8*  MCV 89.7  < > 91.4 91.2  PLT 294  < > 193 161  < > = values in this interval not  displayed. Coagulation:  Recent Labs Lab 06/18/12 1842 06/19/12 0530  LABPROT 13.7 13.8  INR 1.06 1.07   Anemia Panel:  Recent Labs Lab 06/19/12 0052  RETICCTPCT 2.0   Urinalysis:  Recent Labs Lab 06/18/12 2024  COLORURINE YELLOW  LABSPEC 1.015  PHURINE 5.5  GLUCOSEU NEGATIVE  HGBUR NEGATIVE  BILIRUBINUR NEGATIVE  KETONESUR NEGATIVE  PROTEINUR NEGATIVE  UROBILINOGEN 0.2  NITRITE NEGATIVE  LEUKOCYTESUR NEGATIVE    Micro Results: No results found for this or any previous visit (from the past 240 hour(s)). Studies/Results: No results found. Medications: I have reviewed the patient's current medications. Scheduled Meds: . acetaZOLAMIDE  250 mg Oral BID  . pantoprazole  40 mg Oral BID   Continuous Infusions:   PRN Meds:.albuterol, ondansetron (ZOFRAN) IV, ondansetron Assessment/Plan: # GI Bleed w/acute blood loss anemia- Hb = 10.5 >>6.7 on 5/27. FOBT positive in the ED. Signs/symptoms are most consistent with peptic ulcer disease, particularly in the context of the patient's history of chronic aspirin usage, and her recent prednisone usage. Pt is visibly jaundiced, which he notes as a new finding as well, however total bili is wnl (of note, he takes beta-carotene). Lipase slightly elevated.  CT abdomen was negative for any acute findings. Ischemic colitis cannot be ruled out, however is unlikely given his complaints have persisted for several days and he has remained stable throughout that period of time.  - transfer to stepdown if pt becomes symptomatic or vital signs indicate decompensation - Monitor vitals  - Protonix IV bid  - trend CBCs q8h  - GI consulted much appreciated EGD on 5/25 with 2 small non-bleeding ulcers in prepyloric region and duodenal bulb, bx taken and pending -GI originally signed off but was reconsulted today given concern for still active bleeding and question to consider colonoscopy while inpt- will await recs  # Acute kidney injury  resolaved- Cr = 1.98 >>1.2 with IVF blouses. No baseline on record, but pt denies CKD). AKI most likely secondary to prerenal azotemia in the setting of decreased PO intake over the last few days as is further supported by the presence of orthostatic hypotension. He received total of 3L bolus.   - NS at 75 cc/hr as pt tolerating clear diet - check BMET in AM   # SOB -resolving: likely secondary to anemia associated with GI bleed. Worsened asthma could be contributing, although he was CTA on exam. Pt travels regularly from [place] to [place] on business, but denies any recent leg swelling or CP, SOB has been exertional, and revised geneva = 3 (low probability) making PE less likely. O2 sats 100% on RA.  - continue to monitor symptoms.   #Hypotension -improving: SBP 90/50 on arrival with IVF and this AM 120s/60s. Pt has hx of HTN but holding ACE-I in setting of AKI and hypotension.   Dispo: Disposition is deferred at this time, awaiting improvement of current medical problems.  Anticipated discharge in approximately 1-2 day(s).   The patient does have a current PCP (COLLINS, DANA, DO), therefore will be requiring OPC follow-up after discharge.   The patient does not have transportation limitations that hinder transportation to clinic appointments.  .Services Needed at time of discharge: Y = Yes, Blank = No PT:   OT:   RN:   Equipment:   Other:     LOS: 3 days   Clinton Gallant 06/21/2012, 9:37 AM Pgr: [phone]

## 2012-06-21 NOTE — Plan of Care (Signed)
Problem: Food- and Nutrition-Related Knowledge Deficit (NB-1.1) Goal: Nutrition education Formal process to instruct or train a patient/client in a skill or to impart knowledge to help patients/clients voluntarily manage or modify food choices and eating behavior to maintain or improve health.  Outcome: Completed/Met Date Met:  06/21/12  RD consulted for nutrition education regarding high iron diet.  Body mass index is 27.06 kg/(m^2). Pt meets criteria for overweight based on current BMI.  RD provided "Iron Nutrition Therapy" handout from the Academy of [employer]. Emphasized high fiber food list and reviewed ways to incorporate in diet. Emphasized beverages that may prevent iron absorption and recommended curbing intake of these beverages at meals. Encouraged pt to discuss supplement use with MD and cautioned regarding constipation with iron supplements. Encouraged adequate hydration and increased fiber if initiating. Teach back method used.  Expect good compliance.  Current diet order is Heart Healthy, patient is consuming approximately 50% of meals at this time. Labs and medications reviewed. No further nutrition interventions warranted at this time. RD contact information provided. If additional nutrition issues arise, please re-consult RD.  Brynda Greathouse, MS RD LDN Clinical Inpatient Dietitian Pager: [phone] Weekend/After hours pager: [phone]

## 2012-06-22 ENCOUNTER — Telehealth: Payer: Self-pay | Admitting: Internal Medicine

## 2012-06-22 ENCOUNTER — Encounter: Payer: Self-pay | Admitting: Internal Medicine

## 2012-06-22 ENCOUNTER — Encounter (HOSPITAL_COMMUNITY): Payer: Self-pay | Admitting: Internal Medicine

## 2012-06-22 LAB — TYPE AND SCREEN
Unit division: 0
Unit division: 0

## 2012-06-22 NOTE — Telephone Encounter (Signed)
Gave patient results as per letter dictated by Dr. Olevia Perches.

## 2012-06-24 ENCOUNTER — Telehealth: Payer: Self-pay | Admitting: Internal Medicine

## 2012-06-24 NOTE — Telephone Encounter (Signed)
I spoke with Brett Robles at [facility].  Patient had Hgb check last night and was 6.7.  Patient was heme positive in their office and reporting black stools.  They have sent him to [facility] for 2 units PRBC out patient  Today.  They want the patient seen on Monday.  I have discussed with Dr. Olevia Perches and Ardis Hughs (both cared for him during his recent admission), They want him to be evaluated in the ER at [facility] rather than travel to [place].  Brett Robles notified

## 2012-06-25 ENCOUNTER — Inpatient Hospital Stay (HOSPITAL_COMMUNITY)
Admission: EM | Admit: 2012-06-25 | Discharge: 2012-06-28 | DRG: 378 | Disposition: A | Discharge status: Home / Self Care | Payer: No Typology Code available for payment source | Attending: Internal Medicine | Admitting: Internal Medicine

## 2012-06-25 ENCOUNTER — Encounter (HOSPITAL_COMMUNITY): Payer: Self-pay | Admitting: Emergency Medicine

## 2012-06-25 DIAGNOSIS — K253 Acute gastric ulcer without hemorrhage or perforation: Secondary | ICD-10-CM

## 2012-06-25 DIAGNOSIS — R17 Unspecified jaundice: Secondary | ICD-10-CM | POA: Diagnosis present

## 2012-06-25 DIAGNOSIS — K922 Gastrointestinal hemorrhage, unspecified: Principal | ICD-10-CM

## 2012-06-25 DIAGNOSIS — Z79899 Other long term (current) drug therapy: Secondary | ICD-10-CM

## 2012-06-25 DIAGNOSIS — Z860100 Personal history of colon polyps, unspecified: Secondary | ICD-10-CM

## 2012-06-25 DIAGNOSIS — I10 Essential (primary) hypertension: Secondary | ICD-10-CM | POA: Diagnosis present

## 2012-06-25 DIAGNOSIS — K2970 Gastritis, unspecified, without bleeding: Secondary | ICD-10-CM | POA: Diagnosis present

## 2012-06-25 DIAGNOSIS — K921 Melena: Secondary | ICD-10-CM

## 2012-06-25 DIAGNOSIS — K269 Duodenal ulcer, unspecified as acute or chronic, without hemorrhage or perforation: Secondary | ICD-10-CM

## 2012-06-25 DIAGNOSIS — K648 Other hemorrhoids: Secondary | ICD-10-CM | POA: Diagnosis present

## 2012-06-25 DIAGNOSIS — D126 Benign neoplasm of colon, unspecified: Secondary | ICD-10-CM

## 2012-06-25 DIAGNOSIS — E7800 Pure hypercholesterolemia, unspecified: Secondary | ICD-10-CM | POA: Diagnosis present

## 2012-06-25 DIAGNOSIS — R110 Nausea: Secondary | ICD-10-CM | POA: Diagnosis present

## 2012-06-25 DIAGNOSIS — D62 Acute posthemorrhagic anemia: Secondary | ICD-10-CM

## 2012-06-25 DIAGNOSIS — H409 Unspecified glaucoma: Secondary | ICD-10-CM | POA: Diagnosis present

## 2012-06-25 DIAGNOSIS — K259 Gastric ulcer, unspecified as acute or chronic, without hemorrhage or perforation: Secondary | ICD-10-CM | POA: Diagnosis present

## 2012-06-25 DIAGNOSIS — J45909 Unspecified asthma, uncomplicated: Secondary | ICD-10-CM | POA: Diagnosis present

## 2012-06-25 HISTORY — DX: Gastric ulcer, unspecified as acute or chronic, without hemorrhage or perforation: K25.9

## 2012-06-25 LAB — COMPREHENSIVE METABOLIC PANEL
ALT: 15 U/L (ref 0–53)
Alkaline Phosphatase: 41 U/L (ref 39–117)
CO2: 23 mEq/L (ref 19–32)
GFR calc Af Amer: 74 mL/min — ABNORMAL LOW (ref >90)
Glucose, Bld: 121 mg/dL — ABNORMAL HIGH (ref 70–99)
Potassium: 3.7 mEq/L (ref 3.5–5.1)
Sodium: 136 mEq/L (ref 135–145)
Total Protein: 5.3 g/dL — ABNORMAL LOW (ref 6.0–8.3)

## 2012-06-25 LAB — CBC WITH DIFFERENTIAL/PLATELET
Basophils Absolute: 0.1 10*3/uL (ref 0.0–0.1)
Basophils Relative: 0 % (ref 0–1)
Eosinophils Absolute: 0.2 10*3/uL (ref 0.0–0.7)
Eosinophils Relative: 1 % (ref 0–5)
HCT: 19.3 % — ABNORMAL LOW (ref 39.0–52.0)
MCH: 31 pg (ref 26.0–34.0)
MCHC: 34.7 g/dL (ref 30.0–36.0)
Monocytes Absolute: 1.1 10*3/uL — ABNORMAL HIGH (ref 0.1–1.0)
Neutro Abs: 10.2 10*3/uL — ABNORMAL HIGH (ref 1.7–7.7)
RDW: 16.7 % — ABNORMAL HIGH (ref 11.5–15.5)

## 2012-06-25 LAB — CBC
Hemoglobin: 6.8 g/dL — CL (ref 13.0–17.0)
MCH: 31.2 pg (ref 26.0–34.0)
MCHC: 34.7 g/dL (ref 30.0–36.0)
MCV: 89.9 fL (ref 78.0–100.0)

## 2012-06-25 LAB — APTT: aPTT: 26 seconds (ref 24–37)

## 2012-06-25 LAB — PREPARE RBC (CROSSMATCH)

## 2012-06-25 MED ORDER — ONDANSETRON HCL 4 MG PO TABS: 4.0000 mg | ORAL_TABLET | Freq: Four times a day (QID) | ORAL | Status: DC | PRN

## 2012-06-25 MED ORDER — SODIUM CHLORIDE 0.9 % IV BOLUS (SEPSIS)
1000.0000 mL | Freq: Once | INTRAVENOUS | Status: AC
  Administered 2012-06-25: 1000 mL via INTRAVENOUS

## 2012-06-25 MED ORDER — SODIUM CHLORIDE 0.9 % IV SOLN
80.0000 mg | Freq: Once | INTRAVENOUS | Status: AC
  Administered 2012-06-25: 80 mg via INTRAVENOUS
  Filled 2012-06-25 (×2): qty 80

## 2012-06-25 MED ORDER — ACETAMINOPHEN 650 MG RE SUPP: 650.0000 mg | Freq: Four times a day (QID) | RECTAL | Status: DC | PRN

## 2012-06-25 MED ORDER — DORZOLAMIDE HCL-TIMOLOL MAL 2-0.5 % OP SOLN
1.0000 [drp] | Freq: Two times a day (BID) | OPHTHALMIC | Status: DC
  Administered 2012-06-25 – 2012-06-27 (×4): 1 [drp] via OPHTHALMIC
  Filled 2012-06-25: qty 10

## 2012-06-25 MED ORDER — LATANOPROST 0.005 % OP SOLN
1.0000 [drp] | Freq: Every day | OPHTHALMIC | Status: DC
  Administered 2012-06-25 – 2012-06-27 (×3): 1 [drp] via OPHTHALMIC
  Filled 2012-06-25: qty 2.5

## 2012-06-25 MED ORDER — ONDANSETRON HCL 4 MG/2ML IJ SOLN: 4.0000 mg | Freq: Four times a day (QID) | INTRAMUSCULAR | Status: DC | PRN

## 2012-06-25 MED ORDER — SODIUM CHLORIDE 0.9 % IV SOLN
8.0000 mg/h | INTRAVENOUS | Status: DC
  Administered 2012-06-25 – 2012-06-26 (×2): 8 mg/h via INTRAVENOUS
  Filled 2012-06-25 (×4): qty 80

## 2012-06-25 MED ORDER — ALBUTEROL SULFATE HFA 108 (90 BASE) MCG/ACT IN AERS: 2.0000 | INHALATION_SPRAY | Freq: Four times a day (QID) | RESPIRATORY_TRACT | Status: DC | PRN

## 2012-06-25 MED ORDER — ACETAMINOPHEN 325 MG PO TABS
650.0000 mg | ORAL_TABLET | Freq: Four times a day (QID) | ORAL | Status: DC | PRN
  Administered 2012-06-25: 650 mg via ORAL
  Filled 2012-06-25: qty 2

## 2012-06-25 MED ORDER — SODIUM CHLORIDE 0.9 % IJ SOLN
3.0000 mL | Freq: Two times a day (BID) | INTRAMUSCULAR | Status: DC
  Administered 2012-06-25 – 2012-06-27 (×4): 3 mL via INTRAVENOUS

## 2012-06-25 NOTE — ED Notes (Signed)
Pt sent here for continued anemia; pt sts seen here with gastric ulcer last week and had blood transfusion yesterday but sts still hgb of 7 today; pt appears pale; pt sts some black stools

## 2012-06-25 NOTE — ED Provider Notes (Signed)
History     CSN: [id number]  Arrival date & time 06/25/12  1541   First MD Initiated Contact with Patient 06/25/12 1601      Chief Complaint  Patient presents with  . Anemia    (Consider location/radiation/quality/duration/timing/severity/associated sxs/prior treatment) HPI Comments: 62 y.o. male who was discharged on the 27th, approx 4 days ago, after being tx for upper GI bleed. Pt had ECG done 6 days ago, and found to have multiple small ulcers, but no active signs of significant bleeding. He did not require any clips by GI team as an inpatient. Subsequently, after discharge, pt states he felt worsening weakness, and he was seen by his pcp as outpatient on Thursday, and he was given 2 units of blood for hb in the low 6 per patient, and he had repeat labs done this morning, that showed hb in 7s, and this prompted pcp to tell him to come to the Er for further evaluation. He states he is not having any "bright red blood" in his stool, and is not having the type of blood from his stool that prompted initial admission. He had noticed dark colored stool. He states that after blood transfusion 3 days ago, he has felt, "a lot better", and today has been feeling, "pretty good".   Patient is a 62 y.o. male presenting with general illness. The history is provided by the patient.  Illness Severity:  Mild Onset quality:  Gradual Timing:  Constant Progression:  Worsening Chronicity:  Recurrent Associated symptoms: no abdominal pain, no chest pain, no congestion, no diarrhea, no headaches, no nausea, no rash, no shortness of breath and no wheezing     Past Medical History  Diagnosis Date  . Hypertension   . High cholesterol   . Glaucoma   . Asthma   . Gastric ulcer     Past Surgical History  Procedure Laterality Date  . Esophagogastroduodenoscopy N/A 06/19/2012    Procedure: ESOPHAGOGASTRODUODENOSCOPY (EGD);  Surgeon: Lafayette Dragon, MD;  Location: [facility] ENDOSCOPY;  Service: Endoscopy;   Laterality: N/A;    History reviewed. No pertinent family history.  History  Substance Use Topics  . Smoking status: Never Smoker   . Smokeless tobacco: Not on file  . Alcohol Use: No      Review of Systems  Constitutional: Negative for chills and activity change.  HENT: Negative for congestion, drooling, mouth sores and neck pain.   Eyes: Negative for pain and discharge.  Respiratory: Negative for chest tightness, shortness of breath and wheezing.   Cardiovascular: Negative for chest pain.  Gastrointestinal: Negative for nausea, abdominal pain, diarrhea and constipation.       Dark colored stool   Genitourinary: Negative for dysuria, flank pain and difficulty urinating.  Musculoskeletal: Negative for back pain and arthralgias.  Skin: Negative for color change, pallor and rash.  Neurological: Positive for weakness. Negative for dizziness, light-headedness and headaches.  Psychiatric/Behavioral: Negative for behavioral problems, confusion and agitation.    Allergies  Review of patient's allergies indicates no known allergies.  Home Medications   Current Outpatient Rx  Name  Route  Sig  Dispense  Refill  . acetaZOLAMIDE (DIAMOX) 250 MG tablet   Oral   Take 250 mg by mouth 2 (two) times daily.         Marland Kitchen albuterol (PROVENTIL HFA;VENTOLIN HFA) 108 (90 BASE) MCG/ACT inhaler   Inhalation   Inhale 2 puffs into the lungs every 6 (six) hours as needed for wheezing. For wheezing/shortness of breath         .  beta carotene w/minerals (OCUVITE) tablet   Oral   Take 1 tablet by mouth daily.         Marland Kitchen lisinopril (PRINIVIL,ZESTRIL) 10 MG tablet   Oral   Take 10 mg by mouth daily.         . Multiple Vitamin (MULTIVITAMIN WITH MINERALS) TABS   Oral   Take 1 tablet by mouth daily.         Marland Kitchen OVER THE COUNTER MEDICATION   Both Eyes   Place 1 drop into both eyes 2 (two) times daily as needed. For dry eyes Medication:OTC re-wetting drop (name unknown)         .  pantoprazole (PROTONIX) 40 MG tablet   Oral   Take 1 tablet (40 mg total) by mouth 2 (two) times daily.   30 tablet   11   . pravastatin (PRAVACHOL) 40 MG tablet   Oral   Take 40 mg by mouth every evening.         . sulfamethoxazole-trimethoprim (BACTRIM DS) 800-160 MG per tablet   Oral   Take 1 tablet by mouth 2 (two) times daily. Began on 06/10/12           BP 178/90  Pulse 114  Temp(Src) 98.2 F (36.8 C) (Oral)  Resp 20  SpO2 98%  Physical Exam  Constitutional: He is oriented to person, place, and time. He appears well-developed. No distress.  HENT:  Head: Normocephalic.  Eyes: Pupils are equal, round, and reactive to light. Right eye exhibits no discharge. Left eye exhibits no discharge.  Neck: Neck supple. No tracheal deviation present.  Cardiovascular: Regular rhythm and intact distal pulses.   Tachycardia   Pulmonary/Chest: Effort normal. No stridor. No respiratory distress. He has no wheezes.  Abdominal: Soft. He exhibits no distension. There is no tenderness. There is no rebound.  Genitourinary:  No hematochezia, does have dark colored stool that is heme positive   Musculoskeletal: Normal range of motion. He exhibits no tenderness.  Neurological: He is alert and oriented to person, place, and time. No cranial nerve deficit.  Skin: Skin is warm. No rash noted. He is not diaphoretic. No erythema.    ED Course  Procedures (including critical care time)  Labs Reviewed  COMPREHENSIVE METABOLIC PANEL - Abnormal; Notable for the following:    Glucose, Bld 121 (*)    BUN 24 (*)    Calcium 8.3 (*)    Total Protein 5.3 (*)    Albumin 2.8 (*)    GFR calc non Af Amer 63 (*)    GFR calc Af Amer 74 (*)    All other components within normal limits  CBC WITH DIFFERENTIAL - Abnormal; Notable for the following:    WBC 14.2 (*)    RBC 2.16 (*)    Hemoglobin 6.7 (*)    HCT 19.3 (*)    RDW 16.7 (*)    Neutro Abs 10.2 (*)    Monocytes Absolute 1.1 (*)    All other  components within normal limits  OCCULT BLOOD, POC DEVICE - Abnormal; Notable for the following:    Fecal Occult Bld POSITIVE (*)    All other components within normal limits  PROTIME-INR  APTT  TYPE AND SCREEN  PREPARE RBC (CROSSMATCH)   No results found.  ECG shows HR of 79, NSR. No pathologic ST wave changes or inverted T waves. prior ECG from 3/18 similar.   MDM  Will repeat labs. Pt still having dark / tarry stools.  He is feeling better post transfusion, and after labs will discuss with GI team for further management and care. Does not appear to be having significant GI bleeding at the time, but does have tachycardia. Will give fluids.   Pt's Hb is found to be 6.7 -- concerning as he had 2 units of PRBCs yesterday and doesn't have appropriate response. I have talked to [place] on call, who states they will eval pt, and have asked that pt be started on protonix drip along w/ protonix infusion. I have consulted internal medicine teaching service and they are admitting patient -- 1 unit of PRBCs are ordered for patient.   Pt is admitted.    1. GI bleed             Marzetta Board, MD 06/25/12 [phone]

## 2012-06-25 NOTE — ED Provider Notes (Signed)
I have supervised the resident on the management of this patient and agree with the note above. I personally interviewed and examined the patient and my addendum is below.   Brett Robles is a 62 y.o. male hx of gastric ulcer here with anemia. Was admitted a week ago and had EGD that showed small ulcers and placed on protonix. He continues to have melena. Was transfused 2 U PRBC yesterday but Hg still unchanged at 7. No vomiting and has good appetite. CBC showed Hg 6.4, given 1 U PRBC and started on protonix drip as per GI. Patient admitted to medicine.   CRITICAL CARE Performed by: Darl Householder, Kasy Iannacone   Total critical care time: 30 min   Critical care time was exclusive of separately billable procedures and treating other patients.  Critical care was necessary to treat or prevent imminent or life-threatening deterioration.  Critical care was time spent personally by me on the following activities: development of treatment plan with patient and/or surrogate as well as nursing, discussions with consultants, evaluation of patient's response to treatment, examination of patient, obtaining history from patient or surrogate, ordering and performing treatments and interventions, ordering and review of laboratory studies, ordering and review of radiographic studies, pulse oximetry and re-evaluation of patient's condition.    Wandra Arthurs, MD 06/25/12 [phone]

## 2012-06-25 NOTE — ED Notes (Signed)
Attempted to call report to floor 

## 2012-06-25 NOTE — ED Notes (Signed)
Pt c/o anemia, reports he has gone to get transfused yesterday and went for bld recheck today, level was in low 7 called PCP and was told to go to ED for another transfusion. Pt reports he is feeling much better than before but still not 100%. Pt reports dark tarry stools. Reports having an endoscopy and was told he had multiple ulcers. Pt in nad, resp e/u. Pt pale looking. Family at bedside.

## 2012-06-25 NOTE — H&P (Signed)
Hospital Admission Note Date: 06/25/2012  Patient name: Brett Robles Medical record number: [id number] Date of birth: 1950/11/04 Age: 62 y.o. Gender: male PCP: Janie Morning, DO  Medical Service: B 1 [facility]  Attending physician:  Dr. Eppie Gibson   1st Contact: Dr. Eulas Post  Pager: [phone] 2nd Contact: Dr. Doug Sou  Pager: [phone] After 5 pm or weekends: 1st Contact:      Pager: [phone] 2nd Contact:      Pager: [phone]  Chief Complaint: recurrent GI bleeding with low Hg  History of Present Illness: Patient is a 62 YO man who was recently discharged from the hospital for a GI bleed. He was found to have a gastric and duodenal ulcer from ASA usage. He went home and slowly lost his energy and was feeling more SOB with exertion. On Thursday he went to see his PCP who had him get 2 units of PRBCs on Friday in out-patient setting. He went back the following day for labs and was called that his levels were still low and he should go to the hospital for further evaluation. He feels better after the blood transfusion with energy levels and breathing although he is still having dark colored stools.   During previous admission he presented with: complaints of melena stating that on the evening 2 days prior to admission he had a bowel movement of soft, brown stool which turned into black, tarry, loose stool by completion. He awoke the next morning and felt lightheaded and short of breath upon standing. He subsequently had another black, tarry bowel movement which was more watery in consistency than the previous one. He has continued to have black, tarry BMs since that time, and states that he has avoided eating any food, or drinking any liquids (other than water) since his symptoms began, due to concern that these would worsen the issue. He reports nausea but no vomiting. He admits to abdominal discomfort which he describes as a "gnawing" sensation which has worsened over the last couple of weeks--he takes  tums for this issue and has recently increased his use of this medication since the issue worsened (4 tums daily). No recent sick contacts. He admits to taking two 325 mg aspirin tablets yesterday for a headache and admits to taking aspirin occasionally as needed for headache in the past.  The patient has a history of asthma and seasonal allergies. He states that for the last 2 weeks he has had worsened allergy/asthma symptoms, and was prescribed Bactrim and prednisone by his PCP approximately one week ago for this issue. He also has had decreased energy for the last 2 weeks, and has been feeling short of breath for the same period of time (although his SOB is more severely worsened over the last 2 days, per above).   Meds: Current Facility-Administered Medications  Medication Dose Route Frequency Provider Last Rate Last Dose  . pantoprazole (PROTONIX) 80 mg in sodium chloride 0.9 % 250 mL infusion  8 mg/hr Intravenous Continuous Iltifat Husain, MD       Current Outpatient Prescriptions  Medication Sig Dispense Refill  . albuterol (PROVENTIL HFA;VENTOLIN HFA) 108 (90 BASE) MCG/ACT inhaler Inhale 2 puffs into the lungs every 6 (six) hours as needed for wheezing. For wheezing/shortness of breath      . beta carotene w/minerals (OCUVITE) tablet Take 1 tablet by mouth daily.      . dorzolamide-timolol (COSOPT) 22.3-6.8 MG/ML ophthalmic solution Place 1 drop into both eyes 2 (two) times daily.      Marland Kitchen  latanoprost (XALATAN) 0.005 % ophthalmic solution Place 1 drop into the right eye daily.      . Multiple Vitamin (MULTIVITAMIN WITH MINERALS) TABS Take 1 tablet by mouth daily.      Marland Kitchen OVER THE COUNTER MEDICATION Take 10 mLs by mouth 2 (two) times daily. Over the counter liquid iron. Unsure on mg dose      . pantoprazole (PROTONIX) 40 MG tablet Take 40 mg by mouth 2 (two) times daily.        Allergies: Allergies as of 06/25/2012  . (No Known Allergies)   Past Medical History  Diagnosis Date  .  Hypertension   . High cholesterol   . Glaucoma   . Asthma   . Gastric ulcer    Past Surgical History  Procedure Laterality Date  . Esophagogastroduodenoscopy N/A 06/19/2012    Procedure: ESOPHAGOGASTRODUODENOSCOPY (EGD);  Surgeon: Lafayette Dragon, MD;  Location: [facility] ENDOSCOPY;  Service: Endoscopy;  Laterality: N/A;   History reviewed. No pertinent family history. History   Social History  . Marital Status: Married    Spouse Name: N/A    Number of Children: N/A  . Years of Education: N/A   Occupational History  . Not on file.   Social History Main Topics  . Smoking status: Never Smoker   . Smokeless tobacco: Not on file  . Alcohol Use: No  . Drug Use: No  . Sexually Active: Yes   Other Topics Concern  . Not on file   Social History Narrative  . No narrative on file    Review of Systems: 10 point ROS reviewed and negative with the exception of items mentioned in the HPI  Physical Exam: Blood pressure 118/75, pulse 87, temperature 98.2 F (36.8 C), temperature source Oral, resp. rate 20, SpO2 100.00%. General: resting in bed, no acute distress, reading magazine, appears slightly jaundiced HEENT: PERRL, EOMI Cardiac: RRR, no rubs, murmurs or gallops Pulm: clear to auscultation bilaterally, moving normal volumes of air Abd: soft, nontender, nondistended, BS present Ext: warm and well perfused, no pedal edema Musculskeletal: No knee effusions bilaterally and no knee pain or swelling, muscle strength intact in lower and upper extremities bilaterally and rated 5/5 Neuro: alert and oriented X3, memory intact for recent events, unable to test remote memory cranial nerves II-XII grossly intact without deficits  Lab results: Basic Metabolic Panel:  Recent Labs  06/25/12 1612  NA 136  K 3.7  CL 103  CO2 23  GLUCOSE 121*  BUN 24*  CREATININE 1.18  CALCIUM 8.3*   Liver Function Tests:  Recent Labs  06/25/12 1612  AST 18  ALT 15  ALKPHOS 41  BILITOT 0.5  PROT  5.3*  ALBUMIN 2.8*   CBC:  Recent Labs  06/25/12 1612  WBC 14.2*  NEUTROABS 10.2*  HGB 6.7*  HCT 19.3*  MCV 89.4  PLT 209   Coagulation:  Recent Labs  06/25/12 1612  LABPROT 13.7  INR 1.06   FOBT - positive for blood  Assessment & Plan by Problem:  GI bleed - Was found to have gastric and duodenal ulcers on last admission with EGD although neither appeared to have recently bled and were not actively bleeding. Still having dark bowel movements and dark stool in rectal vault which was heme positive.  -Reconsult GI -1 unit PRBC -Admit to stepdown for close observation on tele -Protonix drip started in the ED -Recheck CBC post transfusion and in the AM -Will continue to monitor closely although still likely  to be upper GI source -Keep NPO in case of procedure -NSL after transfusion although if keeping NPO will start maintenance fluids  Acute blood loss anemia- See above for full details but given 2 units of PRBCs on Friday with no improvement in Hg level. Hg 6.7 on admission and was 8.1 on discharge. He is still having dark stools at home.  -See above for full recommendations.  Duodenal ulcer and gastric ulcer due to nonsteroidal anti-inflammatory drug (NSAID) - Have stopped ASA and recommended no NSAIDs at home, he has not used any since D/C. Gi consult to determine if one of his ulcers may be more actively bleeding at this time. -See above for full recommendations -Pathology resulted and negative for H. Pylori -Chronic gastritis noted -No evidence for malignancy  Jaundice - Not fully explained, recent imaging study of the abdomen on previous admission without mass or obstruction. Total Bili is 0.5 with no other elevation in LFT. Previously suggested this may be result of beta-carotene supplementation as out-patient however seems like this would not have changed so acutely. Family mentions that it started last Saturday.  DVT ppx - SCDs given acute bleeding  Dispo:  Disposition is deferred at this time, awaiting improvement of current medical problems. Anticipated discharge in approximately 1-2 day(s).   The patient does have a current PCP (COLLINS, DANA, DO), therefore is not requiring OPC follow-up after discharge.   The patient does not have transportation limitations that hinder transportation to clinic appointments.  Signed: Vertell Novak 06/25/2012, 5:37 PM   Otho Bellows, MD Internal Medicine Resident, PGY I [facility] Internal Medicine Program Pager: [phone] 06/25/2012 7:01 PM

## 2012-06-26 ENCOUNTER — Encounter (HOSPITAL_COMMUNITY): Payer: Self-pay | Admitting: Gastroenterology

## 2012-06-26 ENCOUNTER — Encounter (HOSPITAL_COMMUNITY)
Admission: EM | Disposition: A | Discharge status: Home / Self Care | Payer: Self-pay | Source: Home / Self Care | Attending: Internal Medicine

## 2012-06-26 DIAGNOSIS — K269 Duodenal ulcer, unspecified as acute or chronic, without hemorrhage or perforation: Secondary | ICD-10-CM

## 2012-06-26 DIAGNOSIS — K921 Melena: Secondary | ICD-10-CM

## 2012-06-26 DIAGNOSIS — K922 Gastrointestinal hemorrhage, unspecified: Principal | ICD-10-CM

## 2012-06-26 DIAGNOSIS — D62 Acute posthemorrhagic anemia: Secondary | ICD-10-CM

## 2012-06-26 HISTORY — PX: ENTEROSCOPY: SHX5533

## 2012-06-26 LAB — CBC
HCT: 25.1 % — ABNORMAL LOW (ref 39.0–52.0)
HCT: 29.5 % — ABNORMAL LOW (ref 39.0–52.0)
Hemoglobin: 8.6 g/dL — ABNORMAL LOW (ref 13.0–17.0)
Hemoglobin: 10.2 g/dL — ABNORMAL LOW (ref 13.0–17.0)
MCHC: 34.6 g/dL (ref 30.0–36.0)
MCV: 90.8 fL (ref 78.0–100.0)
RDW: 15.7 % — ABNORMAL HIGH (ref 11.5–15.5)
WBC: 9.5 10*3/uL (ref 4.0–10.5)

## 2012-06-26 LAB — BASIC METABOLIC PANEL
CO2: 26 mEq/L (ref 19–32)
GFR calc Af Amer: 79 mL/min — ABNORMAL LOW (ref >90)
Sodium: 139 mEq/L (ref 135–145)

## 2012-06-26 SURGERY — ENTEROSCOPY
Anesthesia: Moderate Sedation

## 2012-06-26 MED ORDER — PANTOPRAZOLE SODIUM 40 MG PO TBEC
40.0000 mg | DELAYED_RELEASE_TABLET | Freq: Every day | ORAL | Status: DC
  Administered 2012-06-26 – 2012-06-28 (×3): 40 mg via ORAL
  Filled 2012-06-26 (×4): qty 1

## 2012-06-26 MED ORDER — PEG-KCL-NACL-NASULF-NA ASC-C 100 G PO SOLR
0.5000 | Freq: Once | ORAL | Status: AC
  Administered 2012-06-27: 50 g via ORAL

## 2012-06-26 MED ORDER — SODIUM CHLORIDE 0.9 % IV SOLN: INTRAVENOUS | Status: DC

## 2012-06-26 MED ORDER — BUTAMBEN-TETRACAINE-BENZOCAINE 2-2-14 % EX AERO
INHALATION_SPRAY | CUTANEOUS | Status: DC | PRN
  Administered 2012-06-26: 2 via TOPICAL

## 2012-06-26 MED ORDER — FENTANYL CITRATE 0.05 MG/ML IJ SOLN
INTRAMUSCULAR | Status: DC | PRN
  Administered 2012-06-26 (×2): 25 ug via INTRAVENOUS

## 2012-06-26 MED ORDER — MIDAZOLAM HCL 10 MG/2ML IJ SOLN
INTRAMUSCULAR | Status: DC | PRN
  Administered 2012-06-26 (×2): 2 mg via INTRAVENOUS
  Administered 2012-06-26: 1 mg via INTRAVENOUS

## 2012-06-26 MED ORDER — PEG-KCL-NACL-NASULF-NA ASC-C 100 G PO SOLR: 1.0000 | Freq: Once | ORAL | Status: DC

## 2012-06-26 MED ORDER — MIDAZOLAM HCL 5 MG/ML IJ SOLN
INTRAMUSCULAR | Status: AC
  Filled 2012-06-26: qty 2

## 2012-06-26 MED ORDER — FENTANYL CITRATE 0.05 MG/ML IJ SOLN
INTRAMUSCULAR | Status: AC
  Filled 2012-06-26: qty 4

## 2012-06-26 MED ORDER — PEG-KCL-NACL-NASULF-NA ASC-C 100 G PO SOLR
0.5000 | Freq: Once | ORAL | Status: AC
  Administered 2012-06-26: 50 g via ORAL
  Filled 2012-06-26: qty 1

## 2012-06-26 MED ORDER — DIPHENHYDRAMINE HCL 50 MG/ML IJ SOLN
INTRAMUSCULAR | Status: AC
  Filled 2012-06-26: qty 1

## 2012-06-26 NOTE — H&P (Signed)
Internal Medicine Attending Admission Note Date: 06/26/2012  Patient name: Brett Robles Medical record number: [id number] Date of birth: 1950-09-21 Age: 61 y.o. Gender: male  I saw and evaluated the patient. I reviewed the resident's note and I agree with the resident's findings and plan as documented in the resident's note.  Chief Complaint(s): Shortness of breath, fatigue, falling hematocrit.  History - key components related to admission:  Brett Robles is a 62 year old man with a history of gastric and duodenal ulcers diagnosed last week and felt to be secondary to nonsteroidal use who continued to have dark stools and a falling hematocrit after discharge. During followup at his primary care provider's office he was noted to have a low hematocrit once again and was transfused. He did not receive the appropriate increase in his hematocrit after those 2 units of packed red blood cells and was advised to come to the hospital for reevaluation. He states he's taking no nonsteroidals since discharge and his stool is no longer tarry black but is still dark. He denies any abdominal pain or bright red blood per rectum. He is without other complaints today and actually feels well after additional blood with an appropriate increase in his hematocrit.  Physical Exam - key components related to admission:  Filed Vitals:   06/26/12 0955 06/26/12 1000 06/26/12 1005 06/26/12 1015  BP: 114/61 112/60 103/56 114/64  Pulse:      Temp: 99 F (37.2 C)     TempSrc: Oral     Resp: 17 18 18 18   Height:      Weight:      SpO2: 100% 97% 95% 95%   General: Well-developed, well-nourished, man sitting comfortably in a chair in no acute distress. Lungs: Clear to auscultation bilaterally without wheezes, rhonchi, or rales. Heart: Regular rate and rhythm without murmurs, rubs, or gallops. Abdomen: Soft, nontender, active bowel sounds. Extremities: Without edema.  Lab results:  Basic Metabolic Panel:  Recent  Labs  06/25/12 1612 06/26/12 0545  NA 136 139  K 3.7 4.2  CL 103 109  CO2 23 26  GLUCOSE 121* 99  BUN 24* 20  CREATININE 1.18 1.11  CALCIUM 8.3* 8.2*   Liver Function Tests:  Recent Labs  06/25/12 1612  AST 18  ALT 15  ALKPHOS 41  BILITOT 0.5  PROT 5.3*  ALBUMIN 2.8*   CBC:  Recent Labs  06/25/12 1612 06/25/12 2225 06/26/12 0545  WBC 14.2* 10.7* 9.5  NEUTROABS 10.2*  --   --   HGB 6.7* 6.8* 8.6*  HCT 19.3* 19.6* 25.1*  MCV 89.4 89.9 90.0  PLT 209 157 151   Coagulation:  Recent Labs  06/25/12 1612  INR 1.06   Misc. Labs:  FOBT: Positive  Other results:  EKG: Normal sinus rhythm at 79 beats per minute, normal axis and intervals, no Q waves or LVH by voltage, slow R wave progression, no ST-T changes, unchanged from the previous ECG on 06/18/2012.  Assessment & Plan by Problem:  Brett Robles is a 62 year old man recently diagnosed with small gastric and duodenal ulcers felt to be secondary to nonsteroidal use. He was discharged last week and has avoided all NSAIDs but continues to have dark stools. He was noted to have a falling hematocrit in his primary care provider's office and received 2 units of packed red blood cells but did not have the appropriate bump in his Hct.. He was therefore readmitted to the hospital for further evaluation. The description of his previous endoscopy  would suggest that he had a low likelihood of rebleeding if the source of his GI bleed was the ulcers seen at that time. He underwent EGD today and they also found mild diffuse gastritis and 2 small duodenal bulb ulcers. This was felt not to be the source of his persistent GI bleeding. At least to the mid jejunum the cause of his bleeding remains unclear.   1) GI bleeding: I appreciate GI's help in redefining his upper GI tract anatomy. The plan is for colonoscopy tomorrow to see if we can't find another source of his GI bleeding as the upper tract at least to the mid jejunum is felt not  to be the culprit. We will maintain 2 wide bore IVs, n.p.o. status, prepare the colon for colonoscopy tomorrow, have an active type and screen in place, and continue to follow serial hemoglobins. We will also continue PPI therapy, and given he is n.p.o. We will do so intravenously. He is currently hemodynamically stable without an obvious upper GI source. He is therefore felt to be stable enough for transfer to a general medical ward on telemetry.  2) Disposition: Disposition is pending the results of his colonoscopy. If unremarkable, he may require a capsule endoscopy to further assess his GI tract. This can be done as an outpatient if his hematocrit remains stable while here.

## 2012-06-26 NOTE — Progress Notes (Signed)
NURSING PROGRESS NOTE  VERDELL DYKMAN [id number] Transfer Data: 06/26/2012 4:12 PM Attending Provider: Karren Cobble, MD WQV:[id number], [place], DO Code Status: full  Brett Robles is a 62 y.o. male patient transferred from 58 -No acute distress noted.  -No complaints of shortness of breath.  -No complaints of chest pain.   Cardiac Monitoring: Box #[id number] in place. Cardiac monitor yields:normal sinus rhythm.  Blood pressure 135/81, pulse 80, temperature 97.7 F (36.5 C), temperature source Oral, resp. rate 17, height 5' 8"  (1.727 m), weight 80.4 kg (177 lb 4 oz), SpO2 98.00%.   IV Fluids:  IV in place, occlusive dsg intact without redness, IV cath forearm left, condition patent and no redness left AC SL   Allergies:  Review of patient's allergies indicates no known allergies.  Past Medical History:   has a past medical history of Hypertension; High cholesterol; Glaucoma; Asthma; and Gastric ulcer.  Past Surgical History:   has past surgical history that includes Esophagogastroduodenoscopy (N/A, 06/19/2012) and Colonoscopy (2008).  Social History:   reports that he has never smoked. He does not have any smokeless tobacco history on file. He reports that he does not drink alcohol or use illicit drugs.  Skin: warm dry intact  Patient/Family orientated to room.  Call light within reach. Patient/family able to voice and demonstrate understanding of unit orientation instructions.    Will continue to evaluate and treat per MD orders.

## 2012-06-26 NOTE — Interval H&P Note (Signed)
History and Physical Interval Note:  06/26/2012 9:23 AM  Brett Robles  has presented today for surgery, with the diagnosis of melena  The various methods of treatment have been discussed with the patient and family. After consideration of risks, benefits and other options for treatment, the patient has consented to  Procedure(s): ENTEROSCOPY (N/A) as a surgical intervention .  The patient's history has been reviewed, patient examined, no change in status, stable for surgery.  I have reviewed the patient's chart and labs.  Questions were answered to the patient's satisfaction.     Owens Loffler

## 2012-06-26 NOTE — Op Note (Signed)
[facility] [place], [id number]   ENDOSCOPY PROCEDURE REPORT  PATIENT: Brett Robles, Brett Robles  MR#: [id number] BIRTHDATE: Jul 02, 1950 , 62  yrs. old GENDER: Male ENDOSCOPIST: Milus Banister, MD PROCEDURE DATE:  06/26/2012 PROCEDURE:  Enteroscopy, diagnostic ASA CLASS:     Class III INDICATIONS:  persistent melena, anemia requiring blood transfusions; EGD Dr.  Olevia Perches 1 week ago found DUs that did not require endoscopic treatment. MEDICATIONS: Fentanyl 50 mcg IV and Versed 5 mg IV TOPICAL ANESTHETIC: Cetacaine Spray  DESCRIPTION OF PROCEDURE: After the risks benefits and alternatives of the procedure were thoroughly explained, informed consent was obtained.  The Pentax Gastroscope [id number] endoscope was introduced through the mouth and advanced to the proximal jejunum. Without limitations.  The instrument was slowly withdrawn as the mucosa was fully examined.     There was no blood in UGI tract to the extent of the exam (proximal to mid jejunum).  There were two small, clean based ulcers in duodenal bulb.  There was mild, non-specific gastritis.  The examination was otherwise normal.  Retroflexed views revealed no abnormalities.     The scope was then withdrawn from the patient and the procedure completed. COMPLICATIONS: There were no complications.  ENDOSCOPIC IMPRESSION: There was no blood in UGI tract to the extent of the exam (proximal to mid jejunum).  There were two small, clean based ulcers in duodenal bulb.  There was mild, non-specific gastritis.  The examination was otherwise normal.  RECOMMENDATIONS: This examination to the proximal-mid jejunum does not adequately explain his persistent GI bleeding and so will plan for colonoscopy tomorrow.    eSigned:  Milus Banister, MD 06/26/2012 10:00 AM

## 2012-06-26 NOTE — Consult Note (Signed)
Referring Provider: [facility] Primary Care Physician:  Janie Morning, DO Primary Gastroenterologist:  Dr.Brodie  Reason for Consultation: recurrent melena with severe anemia  HPI: Brett Robles is a 62 y.o. male with history of hypertension, hyperlipidemia and glaucoma. He also has history of asthma. Patient had been hospitalized last weekend with an acute upper GI bleed and underwent upper endoscopy with Dr. Delfin Edis on 06/19/2012 with finding of a shallow gastric ulcer clean based measuring 3-5 mm and then a duodenal bulb ulcer with surrounding edema measuring 5-9 mm. This was also nonbleeding. No endoscopic intervention was done. The patient did require transfusions and on 06/20/2012 his hemoglobin was 7.1. He was discharged to home and continued to have dark but not frankly tarry stools. He says he felt fine until Thursday, 06/23/2012 noted that point again became extremely fatigued and somewhat short of breath. His hemoglobin at that time was back down to 6.7 he was transfused as an outpatient 2 units his with hemoglobin up to 8.4. His hemoglobin was repeated again yesterday and was back down to 6.7 he was advised to come to the hospital for readmission and transfusions. After 2 units last night his hemoglobin is 8.6 this morning. He says he feels fine. He did have a large dark bowel movement today. He has continued to have some upper abdominal burning and "indigestion" area he says his appetite is been very good he has no complaints of dysphagia or odynophagia He had been taking over-the-counter aspirin/ caffeine tablets for sinus headaches prior to his last admission and has discontinued these. He has been taking Protonix twice daily at home over the past week .   Past Medical History  Diagnosis Date  . Hypertension   . High cholesterol   . Glaucoma   . Asthma   . Gastric ulcer     Past Surgical History  Procedure Laterality Date  . Esophagogastroduodenoscopy N/A 06/19/2012     Procedure: ESOPHAGOGASTRODUODENOSCOPY (EGD);  Surgeon: Lafayette Dragon, MD;  Location: [facility] ENDOSCOPY;  Service: Endoscopy;  Laterality: N/A;    Prior to Admission medications   Medication Sig Start Date End Date Taking? Authorizing Provider  albuterol (PROVENTIL HFA;VENTOLIN HFA) 108 (90 BASE) MCG/ACT inhaler Inhale 2 puffs into the lungs every 6 (six) hours as needed for wheezing. For wheezing/shortness of breath   Yes Historical Provider, MD  beta carotene w/minerals (OCUVITE) tablet Take 1 tablet by mouth daily.   Yes Historical Provider, MD  dorzolamide-timolol (COSOPT) 22.3-6.8 MG/ML ophthalmic solution Place 1 drop into both eyes 2 (two) times daily.   Yes Historical Provider, MD  latanoprost (XALATAN) 0.005 % ophthalmic solution Place 1 drop into the right eye daily.   Yes Historical Provider, MD  Multiple Vitamin (MULTIVITAMIN WITH MINERALS) TABS Take 1 tablet by mouth daily.   Yes Historical Provider, MD  OVER THE COUNTER MEDICATION Take 10 mLs by mouth 2 (two) times daily. Over the counter liquid iron. Unsure on mg dose   Yes Historical Provider, MD  pantoprazole (PROTONIX) 40 MG tablet Take 40 mg by mouth 2 (two) times daily.   Yes Historical Provider, MD    Current Facility-Administered Medications  Medication Dose Route Frequency Provider Last Rate Last Dose  . acetaminophen (TYLENOL) tablet 650 mg  650 mg Oral Q6H PRN Olga Millers, MD   650 mg at 06/25/12 2358   Or  . acetaminophen (TYLENOL) suppository 650 mg  650 mg Rectal Q6H PRN Olga Millers, MD      .  albuterol (PROVENTIL HFA;VENTOLIN HFA) 108 (90 BASE) MCG/ACT inhaler 2 puff  2 puff Inhalation Q6H PRN Olga Millers, MD      . dorzolamide-timolol (COSOPT) 22.3-6.8 MG/ML ophthalmic solution 1 drop  1 drop Both Eyes BID Olga Millers, MD   1 drop at 06/25/12 2355  . latanoprost (XALATAN) 0.005 % ophthalmic solution 1 drop  1 drop Right Eye Daily Olga Millers, MD   1 drop at 06/25/12 2355  .  ondansetron (ZOFRAN) tablet 4 mg  4 mg Oral Q6H PRN Olga Millers, MD       Or  . ondansetron [facility]) injection 4 mg  4 mg Intravenous Q6H PRN Olga Millers, MD      . pantoprazole (PROTONIX) 80 mg in sodium chloride 0.9 % 250 mL infusion  8 mg/hr Intravenous Continuous Iltifat Husain, MD 25 mL/hr at 06/26/12 0457 8 mg/hr at 06/26/12 0457  . sodium chloride 0.9 % injection 3 mL  3 mL Intravenous Q12H Olga Millers, MD   3 mL at 06/25/12 2354    Allergies as of 06/25/2012  . (No Known Allergies)    History reviewed. No pertinent family history.  History   Social History  . Marital Status: Married    Spouse Name: N/A    Number of Children: N/A  . Years of Education: N/A   Occupational History  . Not on file.   Social History Main Topics  . Smoking status: Never Smoker   . Smokeless tobacco: Not on file  . Alcohol Use: No  . Drug Use: No  . Sexually Active: Yes   Other Topics Concern  . Not on file   Social History Narrative  . No narrative on file    Review of Systems: Pertinent positive and negative review of systems were noted in the above HPI section.  All other review of systems was otherwise negative.  Physical Exam: Vital signs in last 24 hours: Temp:  [97.9 F (36.6 C)-98.8 F (37.1 C)] 98.8 F (37.1 C) (06/01 0700) Pulse Rate:  [66-114] 72 (06/01 0506) Resp:  [0-23] 17 (06/01 0715) BP: (101-178)/(54-90) 136/86 mmHg (06/01 0715) SpO2:  [98 %-100 %] 98 % (06/01 0715) Weight:  [177 lb 4 oz (80.4 kg)] 177 lb 4 oz (80.4 kg) (05/31 2130)   General:   Alert,  Well-developed, well-nourished,pale WM  pleasant and cooperative in NAD Head:  Normocephalic and atraumatic. Eyes:  Sclera clear, no icterus.   Conjunctiva pale Ears:  Normal auditory acuity. Nose:  No deformity, discharge,  or lesions. Mouth:  No deformity or lesions.   Neck:  Supple; no masses or thyromegaly. Lungs:  Clear throughout to auscultation.   No wheezes, crackles, or rhonchi.  Heart:  Regular rate and rhythm; no murmurs, clicks, rubs,  or gallops. Abdomen:  Soft,minimally tender epigastrium  BS active,nonpalp mass or hsm.   Rectal:  Deferred  Msk:  Symmetrical without gross deformities. . Pulses:  Normal pulses noted. Extremities:  Without clubbing or edema. Neurologic:  Alert and  oriented x4;  grossly normal neurologically. Skin:  Intact without significant lesions or rashes.. Psych:  Alert and cooperative. Normal mood and affect.  Intake/Output from previous day: 05/31 0701 - 06/01 0700 In: 1202.5 [I.V.:822.5; Blood:380] Out: 750 [Urine:750] Intake/Output this shift:    Lab Results:  Recent Labs  06/25/12 1612 06/25/12 2225 06/26/12 0545  WBC 14.2* 10.7* 9.5  HGB 6.7* 6.8* 8.6*  HCT 19.3* 19.6* 25.1*  PLT 209 157 151  BMET  Recent Labs  06/25/12 1612 06/26/12 0545  NA 136 139  K 3.7 4.2  CL 103 109  CO2 23 26  GLUCOSE 121* 99  BUN 24* 20  CREATININE 1.18 1.11  CALCIUM 8.3* 8.2*   LFT  Recent Labs  06/25/12 1612  PROT 5.3*  ALBUMIN 2.8*  AST 18  ALT 15  ALKPHOS 41  BILITOT 0.5   PT/INR  Recent Labs  06/25/12 1612  LABPROT 13.7  INR 1.06        Studies/Results: No results found.  IMPRESSION:  #84 62 year old male with recent admission with persistent melena and drop in hemoglobin after admission 1 week ago for an acute upper GI bleed. He was found at that time to have a clean-based gastric ulcer and a clean based duodenal ulcer. Etiology of continued bleed likely secondary to oozing from one of these ulcers versus another more distal small bowel ulcer/lesion #2 profound normocytic anemia-patient has had 4 units of blood over the past 48 hours #3 history of hypertension #4 asthma  PLAN: #1 Serial hemoglobins and continue to transfuse to keep hemoglobin in the 8-9 range #2 Continue Protonix infusion #3 have scheduled patient for repeat upper endoscopy and possible enteroscopy this morning with Dr. Ardis Hughs.  Procedures were discussed in detail with the patient and he is agreeable to proceed. If above are unrevealing he will need colonoscopy as the next step. Thank you we will follow with you    [facility]  06/26/2012, 8:44 AM     ________________________________________________________________________  Velora Heckler GI MD note:  I personally examined the patient, reviewed the data and agree with the assessment and plan described above.  Persistent melena, anemia requiring transfusion.  Planning on EGD/enteroscopy later today.   Owens Loffler, MD [facility] Gastroenterology Pager [phone]

## 2012-06-26 NOTE — Progress Notes (Addendum)
Subjective: Still with dark stools. EGD today with stable ulcers. Colonoscopy tomorrow. Hbg stable this morning.   Objective: Vital signs in last 24 hours: Filed Vitals:   06/26/12 1000 06/26/12 1005 06/26/12 1015 06/26/12 1024  BP: 112/60 103/56 114/64 104/90  Pulse:      Temp:      TempSrc:      Resp: 18 18 18 17   Height:      Weight:      SpO2: 97% 95% 95% 98%   Weight change:   Intake/Output Summary (Last 24 hours) at 06/26/12 1223 Last data filed at 06/26/12 1004  Gross per 24 hour  Intake 1552.5 ml  Output    750 ml  Net  802.5 ml   Vitals reviewed. General: resting in bed, NAD, mild jaundiced look HEENT: PERRL, EOMI, no scleral icterus Cardiac: RRR, no rubs, murmurs or gallops Pulm: clear to auscultation bilaterally, no wheezes, rales, or rhonchi Abd: soft, nontender, nondistended, BS present Ext: warm and well perfused, no pedal edema Neuro: alert and oriented X3, nonfocal  Lab Results: Basic Metabolic Panel:  Recent Labs Lab 06/25/12 1612 06/26/12 0545  NA 136 139  K 3.7 4.2  CL 103 109  CO2 23 26  GLUCOSE 121* 99  BUN 24* 20  CREATININE 1.18 1.11  CALCIUM 8.3* 8.2*   Liver Function Tests:  Recent Labs Lab 06/25/12 1612  AST 18  ALT 15  ALKPHOS 41  BILITOT 0.5  PROT 5.3*  ALBUMIN 2.8*   CBC:  Recent Labs Lab 06/25/12 1612 06/25/12 2225 06/26/12 0545  WBC 14.2* 10.7* 9.5  NEUTROABS 10.2*  --   --   HGB 6.7* 6.8* 8.6*  HCT 19.3* 19.6* 25.1*  MCV 89.4 89.9 90.0  PLT 209 157 151   Coagulation:  Recent Labs Lab 06/25/12 1612  LABPROT 13.7  INR 1.06     Micro Results: Recent Results (from the past 240 hour(s))  MRSA PCR SCREENING     Status: None   Collection Time    06/25/12 10:52 PM      Result Value Range Status   MRSA by PCR NEGATIVE  NEGATIVE Final   Comment:            The GeneXpert MRSA Assay (FDA     approved for NASAL specimens     only), is one component of a     comprehensive MRSA colonization   surveillance program. It is not     intended to diagnose MRSA     infection nor to guide or     monitor treatment for     MRSA infections.   Studies/Results: No results found.  Medications: I have reviewed the patient's current medications. Scheduled Meds: . [MAR HOLD] dorzolamide-timolol  1 drop Both Eyes BID  . [MAR HOLD] latanoprost  1 drop Right Eye Daily  . pantoprazole  40 mg Oral Q0600  . peg 3350 powder  0.5 kit Oral Once   And  . [START ON 06/27/2012] peg 3350 powder  0.5 kit Oral Once  . [facility] HOLD] sodium chloride  3 mL Intravenous Q12H   Continuous Infusions: . sodium chloride     PRN Meds:.[MAR HOLD] acetaminophen, [MAR HOLD] acetaminophen, [MAR HOLD] albuterol, [MAR HOLD] ondansetron (ZOFRAN) IV, [MAR HOLD] ondansetron  Assessment/Plan: GI bleed - Was found to have gastric and duodenal ulcers on last admission with EGD although neither appeared to have recently bled and were not actively bleeding. Still having dark bowel movements and dark stool  in rectal vault which was heme positive. Hgb 6.7 in ED, received 1u pRBCs with no response, Hgb 6.8, transfused 2 more units with response to 8.6. A Protonix drip was started, and he was initially admitted to the SDU. GI is following and perfromed EGD today and saw the same stable ulcers, no source for bleed. Plan to do colonoscopy tomorrow. Transferring out of SDU to tele bed and will recheck CBC at 8pm. -Protonix drip  -Recheck CBC @ 8pm -F/u with GI -Clears  Acute blood loss anemia- See above for full details but given 2 units of PRBCs on Friday with no improvement in Hg level. Hg 6.7 on this admission but was 8.1 at last discharge. He is still having dark stools at home. Required 3 more u pRBCs thsi admission. No bleed seen on EGD, colonoscopy tomorrow. -See above for full recommendations.   Duodenal ulcer and gastric ulcer due to nonsteroidal anti-inflammatory drug (NSAID) - Have stopped ASA and recommended no NSAIDs at home,  he has not used any since D/C. Previous EGD results are negative for H. Pylori, showed chronic gastritis, and no evidence for malignancy. GI following. Ulcers stable on repeat EGD. -See above for full recommendations   Jaundice - Not fully explained, recent imaging study of the abdomen on previous admission without mass or obstruction. Total Bili is 0.5 with no other elevation in LFT. Previously suggested this may be result of beta-carotene supplementation as out-patient however seems like this would not have changed so acutely. Family mentions that it started last Saturday.   DVT ppx - SCDs given acute bleeding    Dispo: Disposition is deferred at this time, awaiting improvement of current medical problems.  Anticipated discharge in approximately 1-3 day(s).   The patient does have a current PCP (COLLINS, DANA, DO), therefore will not be requiring OPC follow-up after discharge.   The patient does not have transportation limitations that hinder transportation to clinic appointments.  .Services Needed at time of discharge: Y = Yes, Blank = No PT:   OT:   RN:   Equipment:   Other:     LOS: 1 day   Otho Bellows 06/26/2012, 12:23 PM

## 2012-06-26 NOTE — H&P (View-Only) (Signed)
Brett Robles, Brett Robles, Brett Robles, Brett Robles) (05/27 1011) Pulse Rate:  [82-104] 87 (05/27 1011) Resp:  [16-20] 18 (05/27 1011) BP: (128-156)/(64-79) 128/67 mmHg (05/27 1011) SpO2:  [98 %-100 %] 98 % (05/27 1011) Last BM Date: 06/20/12 General: looks  Well.    Heart: RRR Chest: clear B. No resp distress Abdomen: soft, active BS, NT, ND  Extremities: no CCE Neuro/Psych:  Oriented x 3.    Intake/Output from previous day: 05/26 0701 - 05/27 0700 In: 200 [P.O.:200] Out: 600 [Urine:600]  Intake/Output this shift: Total I/O In: 357.5 [P.O.:240; I.V.:20; Blood:97.5] Out: 275 [Urine:275]  Lab Results:  Recent Labs  06/20/12 1410 06/20/12 2107 06/21/12 0415  WBC 9.2 9.2 8.7  HGB 7.7* 7.1* 6.7*  HCT 22.3* 20.2* 19.8*  PLT 188 193 161  MCV    91  BMET  Recent Labs  06/19/12 0530 06/20/12 0946 06/21/12 0415  NA 138 141 139  K 4.4 4.0 3.8  CL 112 112 108  CO2 21 21 23   GLUCOSE 118* 98 106*  BUN 48* 24* 20  CREATININE 1.50* 1.13 1.20  CALCIUM 8.4 8.2* 8.6   LFT  Recent Labs  06/18/12 1842 06/19/12 0530  PROT 6.3 5.4*  ALBUMIN 3.3* 2.9*  AST 11 11  ALT 15 12  ALKPHOS 58 50  BILITOT 0.6 0.7   PT/INR  Recent Labs  06/18/12 1842 06/19/12 0530  LABPROT 13.7 13.8  INR 1.06 1.07    Studies/Results: Called pathology dept today.  Pt had adenomatous polyp of cecum in 04/2006.  Never came back for repeat study despite letters in 2013 and 2014.  Unable to locate the colonoscopy report however.   EGD  06/19/12 1. The mucosa of the esophagus appeared normal  2. Single non-bleeding ulcer, ranging between 3-5 mm in size, was  found in the prepyloric region of the stomach  3. Single non-bleeding ulcer,  ranging between 5-58m in size, was  found in the duodenal bulb  4. There was no blood in the stomach  5. Biopsies taken to r/o H.Pylori  CT scan Ab/Pelvis  06/18/12 IMPRESSION: No evidence of bowel obstruction. Normal appendix. No colonic wall thickening or inflammatory changes. 2 mm nonobstructing right lower pole renal calculus. No hydronephrosis.   ASSESMENT: *  GI bleed.   EGD 06/19/12: non-bleeding ulcers.  Pathology pending.  Was taking once daily Excedrin PTA.  On BID oral Protonix.  Was on IV Protonix.   *  Normocytic anemia.  Hgb is down nearly 5 grams since admission (was 10.5).  Getting one of one units PRBCs now. Pt not symptomatic. *  Renal insufficiency, resolved.  Suggests dehydration.  Correction of this is adding dilutional effect to the H & H.  *  Adenomatous colon polyps 2008.  Overdue for colonoscopy  PLAN: *  Discussed with Dr JArdis Hughs  Observe pt for now.  Still plan for outpt colonoscopy.  No repeat EGD planned.  Follow H & H.  I will arrange appt    LOS: 3 days   SAzucena Freed Robles, Brett AM Pager: [phone]  ________________________________________________________________________  LVelora HecklerGI MD  note:  I personally examined the patient, reviewed the data and agree with the assessment and plan described above.  He had single dark stool around lunch, otherwise feels great.  Had 1 unit prbc this AM.  HE was taking 548m ASA daily and then 2 pulse doses of prednisone.  No PPI.  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  He knows to avoid ASA and other NSAIDs, use tylenol for pains, stay on PPI once daily and iron supplement (OTC) once daily until seen again in our office in about 6 weeks. We will set up that appt.  At that point, will discuss repeat EGD to confirm healing of GU/DU and also perform surveillance, screening colonoscopy if indicated at same time.   DOwens Loffler MD LCasper [facility] Center[facility] Pager [phone]

## 2012-06-26 NOTE — Progress Notes (Signed)
Pt to [place] to [id number]; VSS; called report.

## 2012-06-27 ENCOUNTER — Encounter (HOSPITAL_COMMUNITY): Payer: Self-pay

## 2012-06-27 ENCOUNTER — Other Ambulatory Visit: Payer: Self-pay

## 2012-06-27 ENCOUNTER — Encounter (HOSPITAL_COMMUNITY)
Admission: EM | Disposition: A | Discharge status: Home / Self Care | Payer: Self-pay | Source: Home / Self Care | Attending: Internal Medicine

## 2012-06-27 DIAGNOSIS — K922 Gastrointestinal hemorrhage, unspecified: Secondary | ICD-10-CM

## 2012-06-27 DIAGNOSIS — D126 Benign neoplasm of colon, unspecified: Secondary | ICD-10-CM

## 2012-06-27 DIAGNOSIS — D500 Iron deficiency anemia secondary to blood loss (chronic): Secondary | ICD-10-CM

## 2012-06-27 HISTORY — PX: COLONOSCOPY: SHX5424

## 2012-06-27 LAB — TYPE AND SCREEN
Unit division: 0
Unit division: 0

## 2012-06-27 LAB — CBC
MCH: 30.8 pg (ref 26.0–34.0)
MCHC: 33.8 g/dL (ref 30.0–36.0)
MCV: 91 fL (ref 78.0–100.0)
Platelets: 175 10*3/uL (ref 150–400)
RBC: 3.12 MIL/uL — ABNORMAL LOW (ref 4.22–5.81)

## 2012-06-27 SURGERY — COLONOSCOPY
Anesthesia: Moderate Sedation

## 2012-06-27 MED ORDER — FENTANYL CITRATE 0.05 MG/ML IJ SOLN
INTRAMUSCULAR | Status: DC | PRN
  Administered 2012-06-27 (×3): 25 ug via INTRAVENOUS

## 2012-06-27 MED ORDER — FENTANYL CITRATE 0.05 MG/ML IJ SOLN
INTRAMUSCULAR | Status: AC
  Filled 2012-06-27: qty 4

## 2012-06-27 MED ORDER — MIDAZOLAM HCL 5 MG/ML IJ SOLN
INTRAMUSCULAR | Status: AC
  Filled 2012-06-27: qty 2

## 2012-06-27 MED ORDER — HYDROCORTISONE 2.5 % RE CREA
TOPICAL_CREAM | Freq: Three times a day (TID) | RECTAL | Status: DC
  Administered 2012-06-28: via TOPICAL
  Filled 2012-06-27: qty 28.35

## 2012-06-27 MED ORDER — MIDAZOLAM HCL 5 MG/5ML IJ SOLN
INTRAMUSCULAR | Status: DC | PRN
  Administered 2012-06-27 (×3): 2 mg via INTRAVENOUS

## 2012-06-27 MED ORDER — DIPHENHYDRAMINE HCL 50 MG/ML IJ SOLN
INTRAMUSCULAR | Status: AC
  Filled 2012-06-27: qty 1

## 2012-06-27 NOTE — Progress Notes (Signed)
Internal Medicine Attending  Date: 06/27/2012  Patient name: Brett Robles Medical record number: [id number] Date of birth: Feb 11, 1950 Age: 62 y.o. Gender: male  I saw and evaluated the patient and discussed his care on a.m. rounds with house staff. I reviewed the resident's note by Dr. Para Skeans and I agree with the resident's findings and plans as documented in his note.

## 2012-06-27 NOTE — H&P (View-Only) (Signed)
Referring Provider: [facility] hospital[facility] Primary Care Physician:  Janie Morning, DO Primary Gastroenterologist:  Dr.Brodie  Reason for Consultation: recurrent melena with severe anemia  HPI: Brett Robles is a 62 y.o. male with history of hypertension, hyperlipidemia and glaucoma. He also has history of asthma. Patient had been hospitalized last weekend with an acute upper GI bleed and underwent upper endoscopy with Dr. Delfin Edis on 06/19/2012 with finding of a shallow gastric ulcer clean based measuring 3-5 mm and then a duodenal bulb ulcer with surrounding edema measuring 5-9 mm. This was also nonbleeding. No endoscopic intervention was done. The patient did require transfusions and on 06/20/2012 his hemoglobin was 7.1. He was discharged to home and continued to have dark but not frankly tarry stools. He says he felt fine until Thursday, 06/23/2012 noted that point again became extremely fatigued and somewhat short of breath. His hemoglobin at that time was back down to 6.7 he was transfused as an outpatient 2 units his with hemoglobin up to 8.4. His hemoglobin was repeated again yesterday and was back down to 6.7 he was advised to come to the hospital for readmission and transfusions. After 2 units last night his hemoglobin is 8.6 this morning. He says he feels fine. He did have a large dark bowel movement today. He has continued to have some upper abdominal burning and "indigestion" area he says his appetite is been very good he has no complaints of dysphagia or odynophagia He had been taking over-the-counter aspirin/ caffeine tablets for sinus headaches prior to his last admission and has discontinued these. He has been taking Protonix twice daily at home over the past week .   Past Medical History  Diagnosis Date  . Hypertension   . High cholesterol   . Glaucoma   . Asthma   . Gastric ulcer     Past Surgical History  Procedure Laterality Date  . Esophagogastroduodenoscopy N/A 06/19/2012     Procedure: ESOPHAGOGASTRODUODENOSCOPY (EGD);  Surgeon: Lafayette Dragon, MD;  Location: [facility] ENDOSCOPY;  Service: Endoscopy;  Laterality: N/A;    Prior to Admission medications   Medication Sig Start Date End Date Taking? Authorizing Provider  albuterol (PROVENTIL HFA;VENTOLIN HFA) 108 (90 BASE) MCG/ACT inhaler Inhale 2 puffs into the lungs every 6 (six) hours as needed for wheezing. For wheezing/shortness of breath   Yes Historical Provider, MD  beta carotene w/minerals (OCUVITE) tablet Take 1 tablet by mouth daily.   Yes Historical Provider, MD  dorzolamide-timolol (COSOPT) 22.3-6.8 MG/ML ophthalmic solution Place 1 drop into both eyes 2 (two) times daily.   Yes Historical Provider, MD  latanoprost (XALATAN) 0.005 % ophthalmic solution Place 1 drop into the right eye daily.   Yes Historical Provider, MD  Multiple Vitamin (MULTIVITAMIN WITH MINERALS) TABS Take 1 tablet by mouth daily.   Yes Historical Provider, MD  OVER THE COUNTER MEDICATION Take 10 mLs by mouth 2 (two) times daily. Over the counter liquid iron. Unsure on mg dose   Yes Historical Provider, MD  pantoprazole (PROTONIX) 40 MG tablet Take 40 mg by mouth 2 (two) times daily.   Yes Historical Provider, MD    Current Facility-Administered Medications  Medication Dose Route Frequency Provider Last Rate Last Dose  . acetaminophen (TYLENOL) tablet 650 mg  650 mg Oral Q6H PRN Olga Millers, MD   650 mg at 06/25/12 2358   Or  . acetaminophen (TYLENOL) suppository 650 mg  650 mg Rectal Q6H PRN Olga Millers, MD      .  albuterol (PROVENTIL HFA;VENTOLIN HFA) 108 (90 BASE) MCG/ACT inhaler 2 puff  2 puff Inhalation Q6H PRN Olga Millers, MD      . dorzolamide-timolol (COSOPT) 22.3-6.8 MG/ML ophthalmic solution 1 drop  1 drop Both Eyes BID Olga Millers, MD   1 drop at 06/25/12 2355  . latanoprost (XALATAN) 0.005 % ophthalmic solution 1 drop  1 drop Right Eye Daily Olga Millers, MD   1 drop at 06/25/12 2355  .  ondansetron (ZOFRAN) tablet 4 mg  4 mg Oral Q6H PRN Olga Millers, MD       Or  . ondansetron [facility]) injection 4 mg  4 mg Intravenous Q6H PRN Olga Millers, MD      . pantoprazole (PROTONIX) 80 mg in sodium chloride 0.9 % 250 mL infusion  8 mg/hr Intravenous Continuous Iltifat Husain, MD 25 mL/hr at 06/26/12 0457 8 mg/hr at 06/26/12 0457  . sodium chloride 0.9 % injection 3 mL  3 mL Intravenous Q12H Olga Millers, MD   3 mL at 06/25/12 2354    Allergies as of 06/25/2012  . (No Known Allergies)    History reviewed. No pertinent family history.  History   Social History  . Marital Status: Married    Spouse Name: N/A    Number of Children: N/A  . Years of Education: N/A   Occupational History  . Not on file.   Social History Main Topics  . Smoking status: Never Smoker   . Smokeless tobacco: Not on file  . Alcohol Use: No  . Drug Use: No  . Sexually Active: Yes   Other Topics Concern  . Not on file   Social History Narrative  . No narrative on file    Review of Systems: Pertinent positive and negative review of systems were noted in the above HPI section.  All other review of systems was otherwise negative.  Physical Exam: Vital signs in last 24 hours: Temp:  [97.9 F (36.6 C)-98.8 F (37.1 C)] 98.8 F (37.1 C) (06/01 0700) Pulse Rate:  [66-114] 72 (06/01 0506) Resp:  [0-23] 17 (06/01 0715) BP: (101-178)/(54-90) 136/86 mmHg (06/01 0715) SpO2:  [98 %-100 %] 98 % (06/01 0715) Weight:  [177 lb 4 oz (80.4 kg)] 177 lb 4 oz (80.4 kg) (05/31 2130)   General:   Alert,  Well-developed, well-nourished,pale WM  pleasant and cooperative in NAD Head:  Normocephalic and atraumatic. Eyes:  Sclera clear, no icterus.   Conjunctiva pale Ears:  Normal auditory acuity. Nose:  No deformity, discharge,  or lesions. Mouth:  No deformity or lesions.   Neck:  Supple; no masses or thyromegaly. Lungs:  Clear throughout to auscultation.   No wheezes, crackles, or rhonchi.  Heart:  Regular rate and rhythm; no murmurs, clicks, rubs,  or gallops. Abdomen:  Soft,minimally tender epigastrium  BS active,nonpalp mass or hsm.   Rectal:  Deferred  Msk:  Symmetrical without gross deformities. . Pulses:  Normal pulses noted. Extremities:  Without clubbing or edema. Neurologic:  Alert and  oriented x4;  grossly normal neurologically. Skin:  Intact without significant lesions or rashes.. Psych:  Alert and cooperative. Normal mood and affect.  Intake/Output from previous day: 05/31 0701 - 06/01 0700 In: 1202.5 [I.V.:822.5; Blood:380] Out: 750 [Urine:750] Intake/Output this shift:    Lab Results:  Recent Labs  06/25/12 1612 06/25/12 2225 06/26/12 0545  WBC 14.2* 10.7* 9.5  HGB 6.7* 6.8* 8.6*  HCT 19.3* 19.6* 25.1*  PLT 209 157 151  BMET  Recent Labs  06/25/12 1612 06/26/12 0545  NA 136 139  K 3.7 4.2  CL 103 109  CO2 23 26  GLUCOSE 121* 99  BUN 24* 20  CREATININE 1.18 1.11  CALCIUM 8.3* 8.2*   LFT  Recent Labs  06/25/12 1612  PROT 5.3*  ALBUMIN 2.8*  AST 18  ALT 15  ALKPHOS 41  BILITOT 0.5   PT/INR  Recent Labs  06/25/12 1612  LABPROT 13.7  INR 1.06        Studies/Results: No results found.  IMPRESSION:  #60 62 year old male with recent admission with persistent melena and drop in hemoglobin after admission 1 week ago for an acute upper GI bleed. He was found at that time to have a clean-based gastric ulcer and a clean based duodenal ulcer. Etiology of continued bleed likely secondary to oozing from one of these ulcers versus another more distal small bowel ulcer/lesion #2 profound normocytic anemia-patient has had 4 units of blood over the past 48 hours #3 history of hypertension #4 asthma  PLAN: #1 Serial hemoglobins and continue to transfuse to keep hemoglobin in the 8-9 range #2 Continue Protonix infusion #3 have scheduled patient for repeat upper endoscopy and possible enteroscopy this morning with Dr. Ardis Hughs.  Procedures were discussed in detail with the patient and he is agreeable to proceed. If above are unrevealing he will need colonoscopy as the next step. Thank you we will follow with you    Amy Wooster [facility]  06/26/2012, 8:44 AM     ________________________________________________________________________  Velora Heckler GI MD note:  I personally examined the patient, reviewed the data and agree with the assessment and plan described above.  Persistent melena, anemia requiring transfusion.  Planning on EGD/enteroscopy later today.   Owens Loffler, MD [facility]astroenterology Pager [phone]

## 2012-06-27 NOTE — Progress Notes (Signed)
Subjective:    Patient states he is feeling well today. Denies any blood in his bowel movements, or any black, tarry stools. No abdominal pain. Tolerated his colonoscopy prep without difficulty.   Interval Events: No acute events.    Objective:    Vital Signs:   Temp:  [98 F (36.7 C)-98.6 F (37 C)] 98 F (36.7 C) (06/02 0502) Pulse Rate:  [77-80] 78 (06/02 0502) Resp:  [17-18] 18 (06/02 0502) BP: (135-145)/(76-94) 135/76 mmHg (06/02 0502) SpO2:  [97 %-98 %] 98 % (06/02 0502) Last BM Date: 06/26/12  24-hour weight change: Weight change:   Intake/Output:  No intake or output data in the 24 hours ending 06/27/12 1123    Physical Exam: General: Vital signs reviewed and noted. Well-developed, well-nourished, in no acute distress; alert, appropriate and cooperative throughout examination.  Lungs:  Normal respiratory effort. Clear to auscultation BL without crackles or wheezes.  Heart: RRR. S1 and S2 normal without gallop, murmur, or rubs.  Abdomen:  BS normoactive. Soft, Nondistended, non-tender.  No masses or organomegaly.  Extremities: No pretibial edema.     Labs:  Basic Metabolic Panel:  Recent Labs Lab 06/21/12 0415 06/25/12 1612 06/26/12 0545  NA 139 136 139  K 3.8 3.7 4.2  CL 108 103 109  CO2 23 23 26   GLUCOSE 106* 121* 99  BUN 20 24* 20  CREATININE 1.20 1.18 1.11  CALCIUM 8.6 8.3* 8.2*    Liver Function Tests:  Recent Labs Lab 06/25/12 1612  AST 18  ALT 15  ALKPHOS 41  BILITOT 0.5  PROT 5.3*  ALBUMIN 2.8*   CBC:  Recent Labs Lab 06/25/12 1612 06/25/12 2225 06/26/12 0545 06/26/12 1845 06/27/12 0541  WBC 14.2* 10.7* 9.5 10.1 6.8  NEUTROABS 10.2*  --   --   --   --   HGB 6.7* 6.8* 8.6* 10.2* 9.6*  HCT 19.3* 19.6* 25.1* 29.5* 28.4*  MCV 89.4 89.9 90.0 90.8 91.0  PLT 209 157 151 191 175    Microbiology: Results for orders placed during the hospital encounter of 06/25/12  MRSA PCR SCREENING     Status: None   Collection Time   06/25/12 10:52 PM      Result Value Range Status   MRSA by PCR NEGATIVE  NEGATIVE Final   Comment:            The GeneXpert MRSA Assay (FDA     approved for NASAL specimens     only), is one component of a     comprehensive MRSA colonization     surveillance program. It is not     intended to diagnose MRSA     infection nor to guide or     monitor treatment for     MRSA infections.    Coagulation Studies:  Recent Labs  06/25/12 1612  LABPROT 13.7  INR 1.06       Medications:    Infusions:    Scheduled Medications: . dorzolamide-timolol  1 drop Both Eyes BID  . latanoprost  1 drop Right Eye Daily  . pantoprazole  40 mg Oral Q0600  . sodium chloride  3 mL Intravenous Q12H    PRN Medications: acetaminophen, acetaminophen, albuterol, ondansetron (ZOFRAN) IV, ondansetron   Assessment/ Plan:   GI bleed - No evidence of active bleeding. Hb stable this AM at 9.6. Two small duodenal ulcers were noted on endoscopy yesterday, which were felt to not be significant enough to explain the patient's GI blood loss.  Pt to undergo colonoscopy later today for further evaluation.  - colonoscopy per GI - cont protonix  Acute blood loss anemia- Hb stable today, with no evidence of active bleeding. - cont to monitor CBC  Duodenal ulcer and gastric ulcer due to nonsteroidal anti-inflammatory drug (NSAID) - Have stopped ASA and recommended no NSAIDs at home (pt states he has been compliant with this).  Previous EGD results are negative for H. Pylori, revealed chronic gastritis, and no evidence for malignancy. GI following. Ulcers stable on repeat EGD.   Jaundice - yellow skin discoloration likely 2/2 beta-carotene supplementation. Bilirubin within normal limits (0.5).   DVT ppx - SCDs given acute bleeding   Dispo: Disposition is deferred at this time, awaiting improvement of current medical problems. Anticipated discharge in approximately 1-2 day(s).  The patient does have a current  PCP (COLLINS, DANA, DO), therefore will not be requiring OPC follow-up after discharge.  The patient does not have transportation limitations that hinder transportation to clinic appointments.  .Services Needed at time of discharge: Y = Yes, Blank = No  PT:    OT:    RN:    Equipment:    Other:       Length of Stay: 2 day(s)   Signed: Gae Gallop, MD  PGY-1, Internal Medicine Resident Pager: [phone] (7AM-5PM) 06/27/2012, 11:23 AM

## 2012-06-27 NOTE — Op Note (Deleted)
[facility] [place], [id number]   COLONOSCOPY PROCEDURE REPORT  PATIENT: Brett, Robles  MR#: [id number] BIRTHDATE: 12-31-1950 , 62  yrs. old GENDER: Male ENDOSCOPIST: Lafayette Dragon, MD REFERRED BY: PROCEDURE DATE:  06/27/2012 PROCEDURE: ASA CLASS: INDICATIONS: MEDICATIONS:  DESCRIPTION OF PROCEDURE:   After the risks and benefits and of the procedure were explained, informed consent was obtained.        The Pentax Ped Colon [id number]  endoscope was introduced through the anus and advanced to the    .  The quality of the prep was    .  The instrument was then slowly withdrawn as the colon was fully examined.   The scope was then withdrawn from the patient and the procedure completed.  COMPLICATIONS: There were no complications. ENDOSCOPIC IMPRESSION:  RECOMMENDATIONS:   REPEAT EXAM:  cc:  _______________________________ eSignedLafayette Dragon, MD 06/27/2012 3:00 PM

## 2012-06-27 NOTE — Op Note (Signed)
Colonoscopy:  The PENTAX generated Op note was incidentally deleted due to technical reasons.   Results:  1.First grade internal hemorrhoids, inflamed due to prep  2. No blood in the colon   3. Small 3 mm polyp removed from cecum with cold biopsy  4. TI not intubated  Nothing to account for GI blood loss. We will proceed with SBCE as an outpatient ( inpatient monitor is out of order) suspect  possible small bowl ulcerations due to NSAID's, similar to duodenal ulcerations. Another possibility is that he had  a slow equilibration of Hgb after a major UGIB resulting in ongoing drop in H/H folowing discharge. He is not bleeding at this time and should restart Iron supplements. OK to discharge in am.

## 2012-06-27 NOTE — Interval H&P Note (Signed)
History and Physical Interval Note:  06/27/2012 1:55 PM  Brett Robles  has presented today for surgery, with the diagnosis of gi bleeding  The various methods of treatment have been discussed with the patient and family. After consideration of risks, benefits and other options for treatment, the patient has consented to  Procedure(s): COLONOSCOPY (N/A) as a surgical intervention .  The patient's history has been reviewed, patient examined, no change in status, stable for surgery.  I have reviewed the patient's chart and labs.  Questions were answered to the patient's satisfaction.     Delfin Edis

## 2012-06-27 NOTE — Progress Notes (Signed)
No problems with prep.  Stools  Clear per pt.  Filed Vitals:   06/26/12 1200 06/26/12 1614 06/26/12 2133 06/27/12 0502  BP: 135/81 135/94 145/83 135/76  Pulse:  80 77 78  Temp:  98.5 F (36.9 C) 98.6 F (37 C) 98 F (36.7 C)  TempSrc:  Oral Oral Oral  Resp: 17 18 18 18   Height:      Weight:      SpO2: 98% 98% 97% 98%   Pt looks well No resp distress  CBC    Component Value Date/Time   WBC 6.8 06/27/2012 0541   RBC 3.12* 06/27/2012 0541   HGB 9.6* 06/27/2012 0541   HCT 28.4* 06/27/2012 0541   PLT 175 06/27/2012 0541   MCV 91.0 06/27/2012 0541   MCH 30.8 06/27/2012 0541   MCHC 33.8 06/27/2012 0541   RDW 16.2* 06/27/2012 0541   LYMPHSABS 2.7 06/25/2012 1612   MONOABS 1.1* 06/25/2012 1612   EOSABS 0.2 06/25/2012 1612   BASOSABS 0.1 06/25/2012 1612   Assessment/Plan *  GI bleed with melena.  Repeated eval with enteroscopy 6/1 with small, clean based duodenal ulcers, not felt cuasative of bleeding   For colonoscopy today *  ABL anemia.  Several transfusions since initial admission/EGD of 5/25.  Not particularly symptomatic from the bleeding. Attending MD note:   I have reviewed the above note,  agree with plan of treatment.For colonoscopy report see Op Note.  [facility] Pager # [phone]

## 2012-06-27 NOTE — Care Management Note (Signed)
    Page 1 of 1   06/28/2012     11:57:53 AM   CARE MANAGEMENT NOTE 06/28/2012  Patient:  Brett Robles, Brett Robles   Account Number:  [id number]  Date Initiated:  06/27/2012  Documentation initiated by:  Tomi Bamberger  Subjective/Objective Assessment:   dx gib  admit- from home, pta indep.     Action/Plan:   Anticipated DC Date:  06/28/2012   Anticipated DC Plan:  [place]  CM consult      Choice offered to / List presented to:             Status of service:  Completed, signed off Medicare Important Message given?   (If response is "NO", the following Medicare IM given date fields will be blank) Date Medicare IM given:   Date Additional Medicare IM given:    Discharge Disposition:  HOME/SELF CARE  Per UR Regulation:  Reviewed for med. necessity/level of care/duration of stay  If discussed at [place] of Stay Meetings, dates discussed:    Comments:  06/28/12 11:56 Tomi Bamberger RN, BSN [phone] patient for dc to home, no NCM referral.  No needs anticipated.  06/27/12 16:37 Tomi Bamberger RN, BSN [phone] patient is from home.  NCM will continue to follow for dc needs.

## 2012-06-28 ENCOUNTER — Encounter: Payer: Self-pay | Admitting: Internal Medicine

## 2012-06-28 ENCOUNTER — Telehealth: Payer: Self-pay

## 2012-06-28 DIAGNOSIS — K922 Gastrointestinal hemorrhage, unspecified: Secondary | ICD-10-CM

## 2012-06-28 LAB — CBC
Hemoglobin: 8.9 g/dL — ABNORMAL LOW (ref 13.0–17.0)
MCH: 30.5 pg (ref 26.0–34.0)
MCV: 90.8 fL (ref 78.0–100.0)
RBC: 2.92 MIL/uL — ABNORMAL LOW (ref 4.22–5.81)

## 2012-06-28 LAB — HM COLONOSCOPY

## 2012-06-28 MED ORDER — FERROUS SULFATE 325 (65 FE) MG PO TABS: 325.0000 mg | ORAL_TABLET | Freq: Every day | ORAL | Status: DC

## 2012-06-28 MED ORDER — FERROUS SULFATE 325 (65 FE) MG PO TABS
325.0000 mg | ORAL_TABLET | Freq: Every day | ORAL | Status: DC
  Administered 2012-06-28: 325 mg via ORAL
  Filled 2012-06-28: qty 1

## 2012-06-28 NOTE — Progress Notes (Signed)
I have personally taken an interval history, reviewed the chart, and examined the patient.  I agree with the extender's note, impression and recommendations.  Sandy Salaam. Deatra Ina, MD, [facility] [phone]

## 2012-06-28 NOTE — Progress Notes (Signed)
Subjective:    Patient states he feels well this AM. Denies any N/V or abdominal pain. He states he had a bowel movement this AM that was light brown and without any blood.   Interval Events: No acute events.    Objective:    Vital Signs:   Temp:  [98.2 F (36.8 C)-98.7 F (37.1 C)] 98.3 F (36.8 C) (06/03 0546) Pulse Rate:  [72-82] 76 (06/03 0546) Resp:  [12-74] 16 (06/03 0546) BP: (96-154)/(47-102) 154/79 mmHg (06/03 0546) SpO2:  [95 %-100 %] 95 % (06/03 0546) Last BM Date: 06/27/12  24-hour weight change: Weight change:   Intake/Output:   Intake/Output Summary (Last 24 hours) at 06/28/12 0852 Last data filed at 06/28/12 9833  Gross per 24 hour  Intake    290 ml  Output      0 ml  Net    290 ml      Physical Exam: General: Vital signs reviewed and noted. Well-developed, well-nourished, in no acute distress; alert, appropriate and cooperative throughout examination.  Lungs:  Normal respiratory effort. Clear to auscultation BL without crackles or wheezes.  Heart: RRR. S1 and S2 normal without gallop, murmur, or rubs.  Abdomen:  BS normoactive. Soft, Nondistended, non-tender.  No masses or organomegaly.  Extremities: No pretibial edema.     Labs:  Basic Metabolic Panel:  Recent Labs Lab 06/25/12 1612 06/26/12 0545  NA 136 139  K 3.7 4.2  CL 103 109  CO2 23 26  GLUCOSE 121* 99  BUN 24* 20  CREATININE 1.18 1.11  CALCIUM 8.3* 8.2*    Liver Function Tests:  Recent Labs Lab 06/25/12 1612  AST 18  ALT 15  ALKPHOS 41  BILITOT 0.5  PROT 5.3*  ALBUMIN 2.8*    CBC:  Recent Labs Lab 06/25/12 1612 06/25/12 2225 06/26/12 0545 06/26/12 1845 06/27/12 0541 06/28/12 0412  WBC 14.2* 10.7* 9.5 10.1 6.8 6.5  NEUTROABS 10.2*  --   --   --   --   --   HGB 6.7* 6.8* 8.6* 10.2* 9.6* 8.9*  HCT 19.3* 19.6* 25.1* 29.5* 28.4* 26.5*  MCV 89.4 89.9 90.0 90.8 91.0 90.8  PLT 209 157 151 191 175 170     Microbiology: Results for orders placed during the  hospital encounter of 06/25/12  MRSA PCR SCREENING     Status: None   Collection Time    06/25/12 10:52 PM      Result Value Range Status   MRSA by PCR NEGATIVE  NEGATIVE Final   Comment:            The GeneXpert MRSA Assay (FDA     approved for NASAL specimens     only), is one component of a     comprehensive MRSA colonization     surveillance program. It is not     intended to diagnose MRSA     infection nor to guide or     monitor treatment for     MRSA infections.    Coagulation Studies:  Recent Labs  06/25/12 1612  LABPROT 13.7  INR 1.06      Medications:    Infusions:    Scheduled Medications: . dorzolamide-timolol  1 drop Both Eyes BID  . [START ON 06/29/2012] ferrous sulfate  325 mg Oral Q breakfast  . hydrocortisone   Topical TID  . latanoprost  1 drop Right Eye Daily  . pantoprazole  40 mg Oral Q0600  . sodium chloride  3 mL  Intravenous Q12H    PRN Medications: acetaminophen, acetaminophen, albuterol, ondansetron (ZOFRAN) IV, ondansetron   Assessment/ Plan:   GI bleed - No evidence of active bleeding. Hb stable this AM at 8.9. Colonoscopy yesterday was unrevealing of a significant source of bleeding. - outpatient capsule endoscopy  Acute blood loss anemia- Hb stable today, with no evidence of active bleeding.   Duodenal ulcer and gastric ulcer due to nonsteroidal anti-inflammatory drug (NSAID) - Has stopped ASA and recommended no NSAIDs at home (pt states he has been compliant with this). Previous EGD results are negative for H. Pylori, revealed chronic gastritis, and no evidence for malignancy. GI following. Ulcers stable on repeat EGD.   Jaundice - yellow skin discoloration likely 2/2 beta-carotene supplementation. Bilirubin within normal limits (0.5).   DVT ppx - SCDs given acute bleeding   Dispo: Discharge home today--see Discharge Summary for details.    .Services Needed at time of discharge: Y = Yes, Blank = No  PT:    OT:    RN:      Equipment:    Other:        Length of Stay: 3 day(s)   Signed: Gae Gallop, MD  PGY-1, Internal Medicine Resident Pager: [phone] (7AM-5PM) 06/28/2012, 8:52 AM

## 2012-06-28 NOTE — Progress Notes (Signed)
     [facility] Daily Rounding Note 06/28/2012, 8:20 AM  SUBJECTIVE:       Feels well.  Light brown stool this AM Ready to go home  OBJECTIVE:         Vital signs in last 24 hours:    Temp:  [98.2 F (36.8 C)-98.7 F (37.1 C)] 98.3 F (36.8 C) (06/03 0546) Pulse Rate:  [72-82] 76 (06/03 0546) Resp:  [12-74] 16 (06/03 0546) BP: (96-154)/(47-102) 154/79 mmHg (06/03 0546) SpO2:  [95 %-100 %] 95 % (06/03 0546) Last BM Date: 06/27/12 General: looks well.    Heart: RRR. Chest: clear.  No labored breathing Abdomen: soft, NT, ND.  No mass or HSM.  Active BS  Extremities: no CCE Neuro/Psych:  Pleasant, anxious.  Intake/Output from previous day: 06/02 0701 - 06/03 0700 In: 290 [P.O.:290] Out: -   Intake/Output this shift:    Lab Results:  Recent Labs  06/26/12 1845 06/27/12 0541 06/28/12 0412  WBC 10.1 6.8 6.5  HGB 10.2* 9.6* 8.9*  HCT 29.5* 28.4* 26.5*  PLT 191 175 170   BMET  Recent Labs  06/25/12 1612 06/26/12 0545  NA 136 139  K 3.7 4.2  CL 103 109  CO2 23 26  GLUCOSE 121* 99  BUN 24* 20  CREATININE 1.18 1.11  CALCIUM 8.3* 8.2*   LFT  Recent Labs  06/25/12 1612  PROT 5.3*  ALBUMIN 2.8*  AST 18  ALT 15  ALKPHOS 41  BILITOT 0.5   PT/INR  Recent Labs  06/25/12 1612  LABPROT 13.7  INR 1.06   ASSESMENT: *  GI bleed/ ABL anemia.  Several units PRBCs transfused since initial incidence of 5/24, 3 this admission.   EGD x 2 with non-bleeding prepyloric and bulbar ulcers.  H Pylori negative, chronic active gastritis on biopsy.   Colonoscopy 6/2:  Small internal hemorrhoids, no blood, 71m cecal polyp removed.  Pathology pending.   No clear source for degree of bleeding. Hgb drop of 1.3 grams in last 48 hours but up overall from nadir of 6.8.  All transfusions completed on 6/1.    PLAN: *  Has outpt capsule endo set up for 6/9, RN visit set for 6/5.  Inpt capsule endo equipment is non-operable at present.  [facility]  Explained all  this in detail and answered his many appropriate questions.  *  Start po Iron, see orders    LOS: 3 days   SAzucena Freed 06/28/2012, 8:20 AM Pager: [phone]

## 2012-06-28 NOTE — Telephone Encounter (Signed)
Message copied by Marlon Pel on Tue Jun 28, 2012 10:04 AM ------      Message from: Vena Rua      Created: Tue Jun 28, 2012  9:01 AM       Hi Barbera Setters, can you arrange for Brett Robles to have a CBC on 6/5 when he comes for pre capsule RN visit?        Thank you.  Sarah ------

## 2012-06-28 NOTE — Progress Notes (Signed)
Pt. discharged to floor,verbalized understanding of discharged instruction,medication,restriction,diet and follow up appointment.Baseline Vitals sign stable,Pt comfortable,no sign and symptom of distress.

## 2012-07-01 ENCOUNTER — Encounter (HOSPITAL_COMMUNITY): Payer: Self-pay | Admitting: Gastroenterology

## 2012-07-01 NOTE — Discharge Summary (Signed)
Name: Brett Robles MRN: [id number] DOB: September 12, 1950 62 y.o. PCP: Brett Morning, DO  Date of Admission: 06/25/2012  3:58 PM Date of Discharge: 07/01/2012 Attending Physician: Dr. Marinda Elk  Discharge Diagnosis: GI bleed Acute blood loss anemia   Discharge Medications:   Medication List    TAKE these medications       albuterol 108 (90 BASE) MCG/ACT inhaler  Commonly known as:  PROVENTIL HFA;VENTOLIN HFA  Inhale 2 puffs into the lungs every 6 (six) hours as needed for wheezing. For wheezing/shortness of breath     dorzolamide-timolol 22.3-6.8 MG/ML ophthalmic solution  Commonly known as:  COSOPT  Place 1 drop into both eyes 2 (two) times daily.     ferrous sulfate 325 (65 FE) MG tablet  Take 1 tablet (325 mg total) by mouth daily with breakfast.     latanoprost 0.005 % ophthalmic solution  Commonly known as:  XALATAN  Place 1 drop into the right eye daily.     multivitamin with minerals Tabs  Take 1 tablet by mouth daily.     OVER THE COUNTER MEDICATION  Take 10 mLs by mouth 2 (two) times daily. Over the counter liquid iron. Unsure on mg dose     pantoprazole 40 MG tablet  Commonly known as:  PROTONIX  Take 40 mg by mouth 2 (two) times daily.        Disposition and follow-up:   Mr.Brett Robles was discharged from [facility] in good condition.  At the hospital follow up visit please address:  1.  F/u on complaints of melanotic stool and assess for recurrence of signs/symptoms of anemia.   2.  Labs / imaging needed at time of follow-up: CBC (or H/H)  3.  Pending labs/ test needing follow-up: N/A  Follow-up Appointments:     Follow-up Information   Follow up with [facility] Nurse On 06/30/2012. (2 PM.  for instructional visit. )    Contact information:   [address] [place] [address] [phone]      Follow up with [facility] Nurse On 06/27/2012. (8 AM for capsule endoscopy)    Contact information:   [address] [place]  [address] [phone]      Follow up with Pricilla Riffle. Fuller Plan, MD On 07/18/2012. (9 AM.  office visit with Dr Fuller Plan. )    Contact information:   [address] [place] [address] [phone]       Discharge Instructions: Discharge Orders   Future Appointments Provider Department Dept Phone   07/04/2012 8:00 AM [facility] [phone]   07/18/2012 9:00 AM Ladene Artist, MD [facility] [phone]   Future Orders Complete By Expires     Diet - low sodium heart healthy  As directed     Increase activity slowly  As directed        Consultations: Treatment Team:  Milus Banister, MD  Procedures Performed:  Dg Chest 2 View  06/19/2012   *RADIOLOGY REPORT*  Clinical Data: Shortness of breath  CHEST - 2 VIEW  Comparison: None.  Findings: Cardiomediastinal silhouette is within normal limits. The lungs are clear. No pleural effusion.  No pneumothorax.  No acute osseous abnormality.  Residual contrast within nondilated colon.  IMPRESSION: No acute cardiopulmonary process.   Original Report Authenticated By: Conchita Paris, M.D.   Ct Abdomen Pelvis W Contrast  06/18/2012   *RADIOLOGY REPORT*  Clinical Data: GI bleed  CT ABDOMEN AND  PELVIS WITH CONTRAST  Technique:  Multidetector CT imaging of the abdomen and pelvis was performed following the standard protocol during bolus administration of intravenous contrast.  Contrast: 55m OMNIPAQUE IOHEXOL 300 MG/ML  SOLN  Comparison: None.  Findings: Lung bases are clear.  Liver, spleen, pancreas, and adrenal glands are within normal limits.  Gallbladder is unremarkable.  No intrahepatic or extrahepatic ductal dilatation.  2 mm nonobstructing right lower pole renal calculus (series 2/image 37).  Left extrarenal pelvis.  No hydronephrosis.  No evidence of bowel obstruction.  Normal appendix.  No colonic wall thickening or inflammatory changes.  Atherosclerotic calcifications of the  abdominal aorta and branch vessels.  No abdominopelvic ascites.  No suspicious abdominopelvic lymphadenopathy.  Prostate is at the upper limits of normal for size, measuring 4.9 cm in transverse dimension.  Bladder is unremarkable.  Degenerative changes of the visualized thoracolumbar spine.  Grade 1 spondylolisthesis at L5-S1.  IMPRESSION: No evidence of bowel obstruction.  Normal appendix.  No colonic wall thickening or inflammatory changes.  2 mm nonobstructing right lower pole renal calculus.  No hydronephrosis.   Original Report Authenticated By: SJulian Hy M.D.    Admission HPI: Patient is a 62YO man who was recently discharged from the hospital for a GI bleed. He was found to have a gastric and duodenal ulcer from ASA usage. He went home and slowly lost his energy and was feeling more SOB with exertion. On Thursday he went to see his PCP who had him get 2 units of PRBCs on Friday in out-patient setting. He went back the following day for labs and was called that his levels were still low and he should go to the hospital for further evaluation. He feels better after the blood transfusion with energy levels and breathing although he is still having dark colored stools.  During previous admission he presented with: complaints of melena stating that on the evening 2 days prior to admission he had a bowel movement of soft, brown stool which turned into black, tarry, loose stool by completion. He awoke the next Robles and felt lightheaded and short of breath upon standing. He subsequently had another black, tarry bowel movement which was more watery in consistency than the previous one. He has continued to have black, tarry BMs since that time, and states that he has avoided eating any food, or drinking any liquids (other than water) since his symptoms began, due to concern that these would worsen the issue. He reports nausea but no vomiting. He admits to abdominal discomfort which he describes as a  "gnawing" sensation which has worsened over the last couple of weeks--he takes tums for this issue and has recently increased his use of this medication since the issue worsened (4 tums daily). No recent sick contacts. He admits to taking two 325 mg aspirin tablets yesterday for a headache and admits to taking aspirin occasionally as needed for headache in the past.  The patient has a history of asthma and seasonal allergies. He states that for the last 2 weeks he has had worsened allergy/asthma symptoms, and was prescribed Bactrim and prednisone by his PCP approximately one week ago for this issue. He also has had decreased energy for the last 2 weeks, and has been feeling short of breath for the same period of time (although his SOB is more severely worsened over the last 2 days, per above).    Hospital Course by problem list: GI bleed - Hb = 6.7 on admission.  He  was transfused 3U PRBCs during his hospital course, and was kept on IV protonix. Patient was found to have gastric and duodenal ulcers on last admission on EGD although neither appeared to have recently bled and were not actively bleeding--EGD was repeated on this admission (6/1) but was unrevealing of any bleeding from the duodenal ulcers visualized on the study. Colonscopy was unrevealing of a bleeding source as well. Hb stable this AM at ~9 for 48 hours prior to discharge. GI has recommended capsule endoscopy as an outpatient to evaluate the small bowel for a source of bleeding, as no convincing source has been identified by upper/lower endoscopy.   Acute blood loss anemia- Secondary to GI bleed. Pt received 3U PRBCs during his hospital course, and was asymptomatic at time of discharge (see above for further details).    Discharge Vitals:   BP 154/79  Pulse 76  Temp(Src) 98.3 F (36.8 C) (Oral)  Resp 16  Ht 5' 8"  (1.727 m)  Wt 177 lb 4 oz (80.4 kg)  BMI 26.96 kg/m2  SpO2 95%  Signed: Gae Gallop, MD 07/01/2012, 4:35 PM   Time  Spent on Discharge: 35 minutes Services Ordered on Discharge: N/A Equipment Ordered on Discharge: N/A

## 2012-07-04 ENCOUNTER — Ambulatory Visit (INDEPENDENT_AMBULATORY_CARE_PROVIDER_SITE_OTHER): Payer: No Typology Code available for payment source | Admitting: Gastroenterology

## 2012-07-04 DIAGNOSIS — K922 Gastrointestinal hemorrhage, unspecified: Secondary | ICD-10-CM

## 2012-07-04 NOTE — Progress Notes (Signed)
Patient here for capsule endoscopy.  He verbalized understanding of all written and verbal instructions  Capsule ID [id number] lot # [id number] exp 07/2013

## 2012-07-18 ENCOUNTER — Encounter: Payer: Self-pay | Admitting: Gastroenterology

## 2012-07-18 ENCOUNTER — Ambulatory Visit (INDEPENDENT_AMBULATORY_CARE_PROVIDER_SITE_OTHER): Payer: No Typology Code available for payment source | Admitting: Gastroenterology

## 2012-07-18 ENCOUNTER — Other Ambulatory Visit (INDEPENDENT_AMBULATORY_CARE_PROVIDER_SITE_OTHER): Payer: No Typology Code available for payment source

## 2012-07-18 VITALS — BP 156/88 | HR 84 | Wt 177.6 lb

## 2012-07-18 DIAGNOSIS — Z7189 Other specified counseling: Secondary | ICD-10-CM

## 2012-07-18 DIAGNOSIS — K922 Gastrointestinal hemorrhage, unspecified: Secondary | ICD-10-CM

## 2012-07-18 DIAGNOSIS — Z860100 Personal history of colon polyps, unspecified: Secondary | ICD-10-CM

## 2012-07-18 DIAGNOSIS — D62 Acute posthemorrhagic anemia: Secondary | ICD-10-CM

## 2012-07-18 DIAGNOSIS — Z7689 Persons encountering health services in other specified circumstances: Secondary | ICD-10-CM

## 2012-07-18 DIAGNOSIS — K260 Acute duodenal ulcer with hemorrhage: Secondary | ICD-10-CM

## 2012-07-18 LAB — CBC WITH DIFFERENTIAL/PLATELET
Basophils Absolute: 0 10*3/uL (ref 0.0–0.1)
HCT: 34.6 % — ABNORMAL LOW (ref 39.0–52.0)
Lymphs Abs: 1.7 10*3/uL (ref 0.7–4.0)
MCHC: 32.8 g/dL (ref 30.0–36.0)
MCV: 91.9 fl (ref 78.0–100.0)
Monocytes Absolute: 0.8 10*3/uL (ref 0.1–1.0)
Platelets: 231 10*3/uL (ref 150.0–400.0)
RDW: 14.1 % (ref 11.5–14.6)

## 2012-07-18 MED ORDER — PANTOPRAZOLE SODIUM 40 MG PO TBEC: 40.0000 mg | DELAYED_RELEASE_TABLET | Freq: Every day | ORAL | Status: DC

## 2012-07-18 NOTE — Patient Instructions (Addendum)
Your physician has requested that you go to the basement for the following lab work before leaving today: CBC.  Decrease your Protonix to one tablet by mouth once daily. A new prescription has been sent to your pharmacy.   We have scheduled an appointment to see Dr. Ronnald Ramp at [facility] on the 1st floor of the [facility] building on 09/07/12 at 10:30am. Please bring your co-pay and if you need to reschedule or cancel please call [phone] atleast one day before to avoid a 50 dollar no show fee.   Thank you for choosing me and [facility].  Pricilla Riffle. Dagoberto Ligas., MD., Marval Regal

## 2012-07-18 NOTE — Progress Notes (Signed)
History of Present Illness: This is a 62 year old male returning following hospitalization for recurrent GI bleed. His initial endoscopy showed a prepyloric ulcer in the duodenal ulcer. His endoscopy was repeated due to recurrent bleeding were 2 clean-based duodenal ulcers were noted in the small gastric ulcer had healed. Colonoscopy revealed a small colon polyp but no site of bleeding. Unfortunately the colonoscopy report in the chart is incomplete. Capsule endoscopy was subsequently performed as an outpatient was negative. He has not noted any recurrent bleeding. His energy level is back to normal he feels well. His most recent hemoglobin was 8.9. He relates taking aspirin with caffeine frequently for management allergy symptoms.  Current Medications, Allergies, Past Medical History, Past Surgical History, Family History and Social History were reviewed in [employer] record.  Physical Exam: General: Well developed , well nourished, no acute distress Head: Normocephalic and atraumatic Eyes:  sclerae anicteric, EOMI Ears: Normal auditory acuity Mouth: No deformity or lesions Lungs: Clear throughout to auscultation Heart: Regular rate and rhythm; no murmurs, rubs or bruits Abdomen: Soft, non tender and non distended. No masses, hepatosplenomegaly or hernias noted. Normal Bowel sounds Musculoskeletal: Symmetrical with no gross deformities  Pulses:  Normal pulses noted Extremities: No clubbing, cyanosis, edema or deformities noted Neurological: Alert oriented x 4, grossly nonfocal Psychological:  Alert and cooperative. Normal mood and affect  Assessment and Recommendations:  1. Recurrent gastrointestinal bleeding likely secondary to duodenal ulcers. Avoid or at least minimize aspirin and NSAID products long-term. Changed pantoprazole 40 mg daily long-term.  2. Posthemorrhagic anemia. Continue iron replacement. Obtain CBC today.  3. Personal history of an adenomatous  colon polyp. Surveillance colonoscopy in 5 years, June 2019.

## 2012-07-19 NOTE — Op Note (Addendum)
[facility] [place], [address]   COLONOSCOPY PROCEDURE REPORT  PATIENT: Brett, Robles  MR#: [id number] BIRTHDATE: 10-Jan-1951 , 62  yrs. old GENDER: Male ENDOSCOPIST: Lafayette Dragon, MD REFERRED BY:  Dr Ardis Hughs PROCEDURE DATE:  06/27/2012 PROCEDURE:   colonoscopy with snare polypectomy ASA CLASS:   Class II INDICATIONS: GIblood loss, Duodenal ulcer  found on EGD 1 week ago, ,patients hemoglobin continues to decline, repeat EGD shows healing DU, MEDICATION: verbal orders, Verset IV, Fentanyl YV  DESCRIPTION OF PROCEDURE:   After the risks and benefits and of the procedure were explained, informed consent was obtained.  A digital rectal exam revealed no abnormalities of the rectum.    The Pentax Ped Colon [id number]  endoscope was introduced through the anus and advanced to the cecum, which was identified by both the appendix and ileocecal valve .  The quality of the prep was good, using MoviPrep .  The instrument was then slowly withdrawn as the colon was fully examined.   COLON FINDINGS: A smooth sessile polyp ranging between 3-28m in size was found at the cecum.  A polypectomy was performed with a cold snare.  The resection was complete and the polyp tissue was completely retrieved.   Small internal hemorrhoids were found. Retroflexed views revealed no abnormalities.     The scope was then withdrawn from the patient and the procedure completed.  COMPLICATIONS: There were no complications. ENDOSCOPIC IMPRESSION:  3-7 mm sessile cecal polyp removed from the cecum with cold snare small internal hemorrhoids no bleeding site seen on this exam RECOMMENDATIONS: Await pathology results advance diet follow H/H, continue PPI for DU, follow up Dr SFuller Plan REPEAT EXAM: for Colonoscopy. in 5 years if polyp is adenomatous  cc: Dr SFuller Plan _______________________________ eSigned:  DLafayette Dragon MD 07/19/2012 1:01 PM Revised: 07/19/2012 1:01  PM    PATIENT NAME:  Brett, MichalskiMR#: [id number]

## 2012-07-21 ENCOUNTER — Encounter: Payer: Self-pay | Admitting: Gastroenterology

## 2012-09-07 ENCOUNTER — Ambulatory Visit: Payer: No Typology Code available for payment source | Admitting: Internal Medicine

## 2012-10-10 ENCOUNTER — Encounter: Payer: Self-pay | Admitting: Internal Medicine

## 2012-10-10 ENCOUNTER — Ambulatory Visit (INDEPENDENT_AMBULATORY_CARE_PROVIDER_SITE_OTHER): Payer: No Typology Code available for payment source | Admitting: Internal Medicine

## 2012-10-10 ENCOUNTER — Other Ambulatory Visit (INDEPENDENT_AMBULATORY_CARE_PROVIDER_SITE_OTHER): Payer: No Typology Code available for payment source

## 2012-10-10 VITALS — BP 148/82 | HR 64 | Temp 98.6°F | Resp 16 | Ht 68.0 in | Wt 173.0 lb

## 2012-10-10 DIAGNOSIS — D649 Anemia, unspecified: Secondary | ICD-10-CM

## 2012-10-10 DIAGNOSIS — Z0001 Encounter for general adult medical examination with abnormal findings: Secondary | ICD-10-CM | POA: Insufficient documentation

## 2012-10-10 DIAGNOSIS — I10 Essential (primary) hypertension: Secondary | ICD-10-CM

## 2012-10-10 DIAGNOSIS — R739 Hyperglycemia, unspecified: Secondary | ICD-10-CM | POA: Insufficient documentation

## 2012-10-10 DIAGNOSIS — IMO0001 Reserved for inherently not codable concepts without codable children: Secondary | ICD-10-CM | POA: Insufficient documentation

## 2012-10-10 DIAGNOSIS — N529 Male erectile dysfunction, unspecified: Secondary | ICD-10-CM

## 2012-10-10 DIAGNOSIS — R7309 Other abnormal glucose: Secondary | ICD-10-CM

## 2012-10-10 DIAGNOSIS — Z23 Encounter for immunization: Secondary | ICD-10-CM

## 2012-10-10 DIAGNOSIS — Z0000 Encounter for general adult medical examination without abnormal findings: Secondary | ICD-10-CM

## 2012-10-10 LAB — HEPATITIS C ANTIBODY: HCV Ab: NEGATIVE

## 2012-10-10 LAB — FOLATE: Folate: >24.8 ng/mL (ref >5.9)

## 2012-10-10 LAB — URINALYSIS, ROUTINE W REFLEX MICROSCOPIC
Bilirubin Urine: NEGATIVE
Nitrite: NEGATIVE
RBC / HPF: NONE SEEN (ref 0–?)
Specific Gravity, Urine: 1.02 (ref 1.000–1.030)
Total Protein, Urine: NEGATIVE
pH: 7 (ref 5.0–8.0)

## 2012-10-10 LAB — LIPID PANEL
LDL Cholesterol: 73 mg/dL (ref 0–99)
Total CHOL/HDL Ratio: 4
VLDL: 16.8 mg/dL (ref 0.0–40.0)

## 2012-10-10 LAB — COMPREHENSIVE METABOLIC PANEL
ALT: 20 U/L (ref 0–53)
AST: 22 U/L (ref 0–37)
Calcium: 9.4 mg/dL (ref 8.4–10.5)
Chloride: 107 mEq/L (ref 96–112)
Creatinine, Ser: 1.2 mg/dL (ref 0.4–1.5)

## 2012-10-10 LAB — CBC WITH DIFFERENTIAL/PLATELET
Basophils Absolute: 0 10*3/uL (ref 0.0–0.1)
Eosinophils Absolute: 0.1 10*3/uL (ref 0.0–0.7)
Hemoglobin: 14.4 g/dL (ref 13.0–17.0)
Lymphocytes Relative: 34.2 % (ref 12.0–46.0)
MCHC: 33.6 g/dL (ref 30.0–36.0)
Neutro Abs: 3.4 10*3/uL (ref 1.4–7.7)
Neutrophils Relative %: 53.1 % (ref 43.0–77.0)
RDW: 14.6 % (ref 11.5–14.6)

## 2012-10-10 LAB — HM HEPATITIS C SCREENING LAB: HM HEPATITIS C SCREENING: NEGATIVE

## 2012-10-10 LAB — HEMOGLOBIN A1C: Hgb A1c MFr Bld: 6.5 % (ref 4.6–6.5)

## 2012-10-10 LAB — FERRITIN: Ferritin: 8.3 ng/mL — ABNORMAL LOW (ref 22.0–322.0)

## 2012-10-10 MED ORDER — TADALAFIL 20 MG PO TABS: 20.0000 mg | ORAL_TABLET | Freq: Every day | ORAL | Status: DC | PRN

## 2012-10-10 MED ORDER — OLMESARTAN MEDOXOMIL 40 MG PO TABS: 40.0000 mg | ORAL_TABLET | Freq: Every day | ORAL | Status: DC

## 2012-10-10 NOTE — Assessment & Plan Note (Signed)
Start cialis as needed

## 2012-10-10 NOTE — Assessment & Plan Note (Signed)
I will check his A1C to see if he has developed DM2

## 2012-10-10 NOTE — Progress Notes (Signed)
Subjective:    Patient ID: Brett Robles, male    DOB: [birth year], 62 y.o.   MRN: [id number]  HPI Comments: New to me for a complete physical. No records available from his previous PCP in [place]. He had an upper GI bleed a few months ago but is doing better. Today he complains of muscle aches in his thighs, calves, and hips. Also he complains of ED.  Hypertension This is a chronic problem. The current episode started more than 1 year ago. The problem is unchanged. The problem is uncontrolled (he tells me that his BP is up to 153/92 at home). Pertinent negatives include no anxiety, blurred vision, chest pain, headaches, malaise/fatigue, neck pain, orthopnea, palpitations, peripheral edema, PND, shortness of breath or sweats. There are no associated agents to hypertension. Past treatments include ACE inhibitors. The current treatment provides mild improvement. There are no compliance problems (he exercies quite a bit).       Review of Systems  Constitutional: Negative.  Negative for fever, chills, malaise/fatigue, diaphoresis, activity change, appetite change, fatigue and unexpected weight change.  HENT: Negative.  Negative for neck pain.   Eyes: Negative.  Negative for blurred vision.  Respiratory: Negative.  Negative for apnea, cough, choking, chest tightness, shortness of breath, wheezing and stridor.   Cardiovascular: Negative.  Negative for chest pain, palpitations, orthopnea, leg swelling and PND.  Gastrointestinal: Negative for nausea, vomiting, abdominal pain, diarrhea, constipation, abdominal distention, anal bleeding and rectal pain.  Endocrine: Negative.  Negative for polydipsia, polyphagia and polyuria.  Genitourinary: Negative for dysuria, urgency, frequency, hematuria, flank pain, decreased urine volume, discharge, penile swelling, scrotal swelling, enuresis, difficulty urinating, genital sores, penile pain and testicular pain.       +ED  Musculoskeletal: Positive for myalgias.  Negative for back pain, joint swelling, arthralgias and gait problem.  Skin: Negative.   Allergic/Immunologic: Negative.   Neurological: Negative.  Negative for dizziness, tremors, weakness, light-headedness, numbness and headaches.  Hematological: Negative.  Negative for adenopathy. Does not bruise/bleed easily.  Psychiatric/Behavioral: Negative.        Objective:   Physical Exam  Vitals reviewed. Constitutional: He is oriented to person, place, and time. He appears well-developed and well-nourished. No distress.  HENT:  Head: Normocephalic and atraumatic.  Mouth/Throat: Oropharynx is clear and moist. No oropharyngeal exudate.  Eyes: Conjunctivae are normal. Right eye exhibits no discharge. Left eye exhibits no discharge. No scleral icterus.  Neck: Normal range of motion. Neck supple. No JVD present. No tracheal deviation present. No thyromegaly present.  Cardiovascular: Normal rate, regular rhythm, normal heart sounds and intact distal pulses.  Exam reveals no gallop and no friction rub.   No murmur heard. Pulmonary/Chest: Effort normal and breath sounds normal. No stridor. No respiratory distress. He has no wheezes. He has no rales. He exhibits no tenderness.  Abdominal: Soft. Bowel sounds are normal. He exhibits no distension and no mass. There is no tenderness. There is no rebound and no guarding. Hernia confirmed negative in the right inguinal area and confirmed negative in the left inguinal area.  Genitourinary: Rectum normal, prostate normal, testes normal and penis normal. Rectal exam shows no external hemorrhoid, no internal hemorrhoid, no fissure, no mass, no tenderness and anal tone normal. Guaiac negative stool. Prostate is not enlarged and not tender. Right testis shows no mass, no swelling and no tenderness. Right testis is descended. Cremasteric reflex is not absent on the right side. Left testis shows no mass, no swelling and no tenderness. Left testis is  descended. Cremasteric  reflex is not absent on the left side. Circumcised. No penile erythema or penile tenderness. No discharge found.  Musculoskeletal: Normal range of motion. He exhibits no tenderness.  Lymphadenopathy:    He has no cervical adenopathy.       Right: No inguinal adenopathy present.       Left: No inguinal adenopathy present.  Neurological: He is oriented to person, place, and time.  Skin: Skin is warm and dry. No rash noted. He is not diaphoretic. No erythema. No pallor.  Psychiatric: He has a normal mood and affect. His behavior is normal. Judgment and thought content normal.     Lab Results  Component Value Date   WBC 5.7 07/18/2012   HGB 11.3* 07/18/2012   HCT 34.6* 07/18/2012   PLT 231.0 07/18/2012   GLUCOSE 99 06/26/2012   ALT 15 06/25/2012   AST 18 06/25/2012   NA 139 06/26/2012   K 4.2 06/26/2012   CL 109 06/26/2012   CREATININE 1.11 06/26/2012   BUN 20 06/26/2012   CO2 26 06/26/2012   INR 1.06 06/25/2012       Assessment & Plan:

## 2012-10-10 NOTE — Assessment & Plan Note (Signed)
BP has not been well controlled non the ACEI so I have changed him to benicar

## 2012-10-10 NOTE — Patient Instructions (Signed)
Health Maintenance, Males A healthy lifestyle and preventative care can promote health and wellness.  Maintain regular health, dental, and eye exams.  Eat a healthy diet. Foods like vegetables, fruits, whole grains, low-fat dairy products, and lean protein foods contain the nutrients you need without too many calories. Decrease your intake of foods high in solid fats, added sugars, and salt. Get information about a proper diet from your caregiver, if necessary.  Regular physical exercise is one of the most important things you can do for your health. Most adults should get at least 150 minutes of moderate-intensity exercise (any activity that increases your heart rate and causes you to sweat) each week. In addition, most adults need muscle-strengthening exercises on 2 or more days a week.   Maintain a healthy weight. The body mass index (BMI) is a screening tool to identify possible weight problems. It provides an estimate of body fat based on height and weight. Your caregiver can help determine your BMI, and can help you achieve or maintain a healthy weight. For adults 20 years and older:  A BMI below 18.5 is considered underweight.  A BMI of 18.5 to 24.9 is normal.  A BMI of 25 to 29.9 is considered overweight.  A BMI of 30 and above is considered obese.  Maintain normal blood lipids and cholesterol by exercising and minimizing your intake of saturated fat. Eat a balanced diet with plenty of fruits and vegetables. Blood tests for lipids and cholesterol should begin at age 31 and be repeated every 5 years. If your lipid or cholesterol levels are high, you are over 50, or you are a high risk for heart disease, you may need your cholesterol levels checked more frequently.Ongoing high lipid and cholesterol levels should be treated with medicines, if diet and exercise are not effective.  If you smoke, find out from your caregiver how to quit. If you do not use tobacco, do not start.  If you  choose to drink alcohol, do not exceed 2 drinks per day. One drink is considered to be 12 ounces (355 mL) of beer, 5 ounces (148 mL) of wine, or 1.5 ounces (44 mL) of liquor.  Avoid use of street drugs. Do not share needles with anyone. Ask for help if you need support or instructions about stopping the use of drugs.  High blood pressure causes heart disease and increases the risk of stroke. Blood pressure should be checked at least every 1 to 2 years. Ongoing high blood pressure should be treated with medicines if weight loss and exercise are not effective.  If you are 20 to 64 years old, ask your caregiver if you should take aspirin to prevent heart disease.  Diabetes screening involves taking a blood sample to check your fasting blood sugar level. This should be done once every 3 years, after age 64, if you are within normal weight and without risk factors for diabetes. Testing should be considered at a younger age or be carried out more frequently if you are overweight and have at least 1 risk factor for diabetes.  Colorectal cancer can be detected and often prevented. Most routine colorectal cancer screening begins at the age of 80 and continues through age 6. However, your caregiver may recommend screening at an earlier age if you have risk factors for colon cancer. On a yearly basis, your caregiver may provide home test kits to check for hidden blood in the stool. Use of a small camera at the end of a tube,  to directly examine the colon (sigmoidoscopy or colonoscopy), can detect the earliest forms of colorectal cancer. Talk to your caregiver about this at age 77, when routine screening begins. Direct examination of the colon should be repeated every 5 to 10 years through age 25, unless early forms of pre-cancerous polyps or small growths are found.  Hepatitis C blood testing is recommended for all people born from [birth year] through 1967 and any individual with known risks for hepatitis C.  Healthy  men should no longer receive prostate-specific antigen (PSA) blood tests as part of routine cancer screening. Consult with your caregiver about prostate cancer screening.  Testicular cancer screening is not recommended for adolescents or adult males who have no symptoms. Screening includes self-exam, caregiver exam, and other screening tests. Consult with your caregiver about any symptoms you have or any concerns you have about testicular cancer.  Practice safe sex. Use condoms and avoid high-risk sexual practices to reduce the spread of sexually transmitted infections (STIs).  Use sunscreen with a sun protection factor (SPF) of 30 or greater. Apply sunscreen liberally and repeatedly throughout the day. You should seek shade when your shadow is shorter than you. Protect yourself by wearing long sleeves, pants, a wide-brimmed hat, and sunglasses year round, whenever you are outdoors.  Notify your caregiver of new moles or changes in moles, especially if there is a change in shape or color. Also notify your caregiver if a mole is larger than the size of a pencil eraser.  A one-time screening for abdominal aortic aneurysm (AAA) and surgical repair of large AAAs by sound wave imaging (ultrasonography) is recommended for ages 3 to 14 years who are current or former smokers.  Stay current with your immunizations. Document Released: 07/11/2007 Document Revised: 04/06/2011 Document Reviewed: 06/09/2010 [facility] Patient Information 2014 [place], [place]. Hypertension As your heart beats, it forces blood through your arteries. This force is your blood pressure. If the pressure is too high, it is called hypertension (HTN) or high blood pressure. HTN is dangerous because you may have it and not know it. High blood pressure may mean that your heart has to work harder to pump blood. Your arteries may be narrow or stiff. The extra work puts you at risk for heart disease, stroke, and other problems.  Blood pressure  consists of two numbers, a higher number over a lower, 110/72, for example. It is stated as "110 over 72." The ideal is below 120 for the top number (systolic) and under 80 for the bottom (diastolic). Write down your blood pressure today. You should pay close attention to your blood pressure if you have certain conditions such as:  Heart failure.  Prior heart attack.  Diabetes  Chronic kidney disease.  Prior stroke.  Multiple risk factors for heart disease. To see if you have HTN, your blood pressure should be measured while you are seated with your arm held at the level of the heart. It should be measured at least twice. A one-time elevated blood pressure reading (especially in the Emergency Department) does not mean that you need treatment. There may be conditions in which the blood pressure is different between your right and left arms. It is important to see your caregiver soon for a recheck. Most people have essential hypertension which means that there is not a specific cause. This type of high blood pressure may be lowered by changing lifestyle factors such as:  Stress.  Smoking.  Lack of exercise.  Excessive weight.  Drug/tobacco/alcohol use.  Eating less salt. Most people do not have symptoms from high blood pressure until it has caused damage to the body. Effective treatment can often prevent, delay or reduce that damage. TREATMENT  When a cause has been identified, treatment for high blood pressure is directed at the cause. There are a large number of medications to treat HTN. These fall into several categories, and your caregiver will help you select the medicines that are best for you. Medications may have side effects. You should review side effects with your caregiver. If your blood pressure stays high after you have made lifestyle changes or started on medicines,   Your medication(s) may need to be changed.  Other problems may need to be addressed.  Be certain you  understand your prescriptions, and know how and when to take your medicine.  Be sure to follow up with your caregiver within the time frame advised (usually within two weeks) to have your blood pressure rechecked and to review your medications.  If you are taking more than one medicine to lower your blood pressure, make sure you know how and at what times they should be taken. Taking two medicines at the same time can result in blood pressure that is too low. SEEK IMMEDIATE MEDICAL CARE IF:  You develop a severe headache, blurred or changing vision, or confusion.  You have unusual weakness or numbness, or a faint feeling.  You have severe chest or abdominal pain, vomiting, or breathing problems. MAKE SURE YOU:   Understand these instructions.  Will watch your condition.  Will get help right away if you are not doing well or get worse. Document Released: 01/12/2005 Document Revised: 04/06/2011 Document Reviewed: 09/02/2007 [facility] Patient Information 2014 [place].

## 2012-10-10 NOTE — Assessment & Plan Note (Signed)
He will stop the statin I will check his CK level

## 2012-10-10 NOTE — Assessment & Plan Note (Signed)
He refused flu, pneumonia, and shingles vaccines today He did get a Tdap booster Exam done Labs ordered Pt ed material was given

## 2012-10-10 NOTE — Assessment & Plan Note (Signed)
He offers no s/s of blood loss  Will check his CBC and iron level

## 2012-10-11 ENCOUNTER — Encounter: Payer: Self-pay | Admitting: Internal Medicine

## 2012-11-25 ENCOUNTER — Telehealth: Payer: Self-pay | Admitting: Internal Medicine

## 2012-11-25 ENCOUNTER — Ambulatory Visit (INDEPENDENT_AMBULATORY_CARE_PROVIDER_SITE_OTHER): Payer: No Typology Code available for payment source

## 2012-11-25 ENCOUNTER — Ambulatory Visit: Payer: No Typology Code available for payment source

## 2012-11-25 DIAGNOSIS — I10 Essential (primary) hypertension: Secondary | ICD-10-CM

## 2012-11-25 DIAGNOSIS — Z23 Encounter for immunization: Secondary | ICD-10-CM

## 2012-11-25 DIAGNOSIS — Z2911 Encounter for prophylactic immunotherapy for respiratory syncytial virus (RSV): Secondary | ICD-10-CM

## 2012-11-25 MED ORDER — OLMESARTAN MEDOXOMIL 40 MG PO TABS: 40.0000 mg | ORAL_TABLET | Freq: Every day | ORAL | Status: DC

## 2012-11-25 NOTE — Telephone Encounter (Signed)
Request refill on benicar 61m, please call into [facility] in [place]

## 2013-01-27 ENCOUNTER — Telehealth: Payer: Self-pay | Admitting: Internal Medicine

## 2013-01-27 DIAGNOSIS — I10 Essential (primary) hypertension: Secondary | ICD-10-CM

## 2013-01-27 MED ORDER — LOSARTAN POTASSIUM 100 MG PO TABS: 100.0000 mg | ORAL_TABLET | Freq: Every day | ORAL | Status: DC

## 2013-01-27 NOTE — Telephone Encounter (Signed)
Pt informed

## 2013-01-27 NOTE — Telephone Encounter (Signed)
done

## 2013-01-27 NOTE — Telephone Encounter (Signed)
01/28/2012  Pt's new insurance is requesting pt get the generic for olmesartan (BENICAR) 40 MG if there is one available.  Please send refill of new RX to pharmacy if available and contact pt when complete.  Thanks.

## 2013-01-27 NOTE — Telephone Encounter (Signed)
01/28/2012 Pt's new insurance is requesting pt get the generic for olmesartan (BENICAR) 40 MG if there is one available. Please send refill of new RX to pharmacy if available and contact pt when complete. Thanks.

## 2013-03-03 ENCOUNTER — Telehealth: Payer: Self-pay | Admitting: Internal Medicine

## 2013-03-03 MED ORDER — PANTOPRAZOLE SODIUM 40 MG PO TBEC: 40.0000 mg | DELAYED_RELEASE_TABLET | Freq: Every day | ORAL | Status: DC

## 2013-03-03 NOTE — Telephone Encounter (Signed)
Rx approved and sent per pt request

## 2013-03-03 NOTE — Telephone Encounter (Signed)
03/03/2013  Pt is requesting a refill on rx pantoprazole (PROTONIX) 40 MG tablet

## 2013-11-20 ENCOUNTER — Ambulatory Visit (INDEPENDENT_AMBULATORY_CARE_PROVIDER_SITE_OTHER)
Admission: RE | Admit: 2013-11-20 | Discharge: 2013-11-20 | Disposition: A | Discharge status: Home / Self Care | Payer: Managed Care, Other (non HMO) | Source: Ambulatory Visit | Attending: Internal Medicine | Admitting: Internal Medicine

## 2013-11-20 ENCOUNTER — Ambulatory Visit (INDEPENDENT_AMBULATORY_CARE_PROVIDER_SITE_OTHER): Payer: Managed Care, Other (non HMO) | Admitting: Internal Medicine

## 2013-11-20 ENCOUNTER — Other Ambulatory Visit (INDEPENDENT_AMBULATORY_CARE_PROVIDER_SITE_OTHER): Payer: Managed Care, Other (non HMO)

## 2013-11-20 ENCOUNTER — Encounter: Payer: Self-pay | Admitting: Internal Medicine

## 2013-11-20 VITALS — BP 150/96 | HR 53 | Temp 98.0°F | Resp 16 | Ht 68.0 in | Wt 173.0 lb

## 2013-11-20 DIAGNOSIS — R0789 Other chest pain: Secondary | ICD-10-CM

## 2013-11-20 DIAGNOSIS — R0683 Snoring: Secondary | ICD-10-CM

## 2013-11-20 DIAGNOSIS — I10 Essential (primary) hypertension: Secondary | ICD-10-CM

## 2013-11-20 DIAGNOSIS — Z23 Encounter for immunization: Secondary | ICD-10-CM | POA: Insufficient documentation

## 2013-11-20 DIAGNOSIS — Z0000 Encounter for general adult medical examination without abnormal findings: Secondary | ICD-10-CM

## 2013-11-20 DIAGNOSIS — M542 Cervicalgia: Secondary | ICD-10-CM

## 2013-11-20 DIAGNOSIS — E118 Type 2 diabetes mellitus with unspecified complications: Secondary | ICD-10-CM

## 2013-11-20 DIAGNOSIS — N522 Drug-induced erectile dysfunction: Secondary | ICD-10-CM

## 2013-11-20 LAB — URINALYSIS, ROUTINE W REFLEX MICROSCOPIC
Bilirubin Urine: NEGATIVE
HGB URINE DIPSTICK: NEGATIVE
Ketones, ur: NEGATIVE
Leukocytes, UA: NEGATIVE
Nitrite: NEGATIVE
RBC / HPF: NONE SEEN (ref 0–?)
Specific Gravity, Urine: 1.02 (ref 1.000–1.030)
TOTAL PROTEIN, URINE-UPE24: NEGATIVE
Urine Glucose: NEGATIVE
Urobilinogen, UA: 0.2 (ref 0.0–1.0)
WBC UA: NONE SEEN (ref 0–?)
pH: 7 (ref 5.0–8.0)

## 2013-11-20 LAB — CBC WITH DIFFERENTIAL/PLATELET
BASOS ABS: 0 10*3/uL (ref 0.0–0.1)
Basophils Relative: 0.5 % (ref 0.0–3.0)
EOS ABS: 0.1 10*3/uL (ref 0.0–0.7)
Eosinophils Relative: 1.2 % (ref 0.0–5.0)
HCT: 45.7 % (ref 39.0–52.0)
Hemoglobin: 15.2 g/dL (ref 13.0–17.0)
LYMPHS PCT: 28.5 % (ref 12.0–46.0)
Lymphs Abs: 2.5 10*3/uL (ref 0.7–4.0)
MCHC: 33.3 g/dL (ref 30.0–36.0)
MCV: 92.2 fl (ref 78.0–100.0)
MONO ABS: 0.7 10*3/uL (ref 0.1–1.0)
Monocytes Relative: 7.6 % (ref 3.0–12.0)
NEUTROS ABS: 5.4 10*3/uL (ref 1.4–7.7)
Neutrophils Relative %: 62.2 % (ref 43.0–77.0)
Platelets: 237 10*3/uL (ref 150.0–400.0)
RBC: 4.95 Mil/uL (ref 4.22–5.81)
RDW: 13.7 % (ref 11.5–15.5)
WBC: 8.8 10*3/uL (ref 4.0–10.5)

## 2013-11-20 LAB — COMPREHENSIVE METABOLIC PANEL
ALBUMIN: 3.8 g/dL (ref 3.5–5.2)
ALK PHOS: 84 U/L (ref 39–117)
ALT: 28 U/L (ref 0–53)
AST: 24 U/L (ref 0–37)
BUN: 18 mg/dL (ref 6–23)
CALCIUM: 9.5 mg/dL (ref 8.4–10.5)
CHLORIDE: 110 meq/L (ref 96–112)
CO2: 21 mEq/L (ref 19–32)
Creatinine, Ser: 1.2 mg/dL (ref 0.4–1.5)
GFR: 62.65 mL/min (ref >60.00)
Glucose, Bld: 77 mg/dL (ref 70–99)
POTASSIUM: 4.4 meq/L (ref 3.5–5.1)
Sodium: 141 mEq/L (ref 135–145)
Total Bilirubin: 1.3 mg/dL — ABNORMAL HIGH (ref 0.2–1.2)
Total Protein: 7.7 g/dL (ref 6.0–8.3)

## 2013-11-20 LAB — TSH: TSH: 1.85 u[IU]/mL (ref 0.35–4.50)

## 2013-11-20 LAB — LIPID PANEL
CHOLESTEROL: 168 mg/dL (ref 0–200)
HDL: 33.9 mg/dL — ABNORMAL LOW (ref >39.00)
LDL Cholesterol: 118 mg/dL — ABNORMAL HIGH (ref 0–99)
NonHDL: 134.1
Total CHOL/HDL Ratio: 5
Triglycerides: 79 mg/dL (ref 0.0–149.0)
VLDL: 15.8 mg/dL (ref 0.0–40.0)

## 2013-11-20 LAB — HEMOGLOBIN A1C: HEMOGLOBIN A1C: 5.8 % (ref 4.6–6.5)

## 2013-11-20 MED ORDER — TADALAFIL 20 MG PO TABS: 20.0000 mg | ORAL_TABLET | Freq: Every day | ORAL | Status: DC | PRN

## 2013-11-20 NOTE — Progress Notes (Signed)
Subjective:    Patient ID: Brett Robles, male    DOB: [birth year], 63 y.o.   MRN: [id number]  Neck Pain  This is a recurrent problem. The current episode started more than 1 month ago. The problem occurs intermittently. The problem has been unchanged. The pain is associated with nothing. The pain is present in the right side. The quality of the pain is described as aching. The pain is at a severity of 2/10. The pain is mild. Nothing aggravates the symptoms. The pain is same all the time. Associated symptoms include chest pain (he has had a pain over the right lower chest wall for several months) and paresis (he feels le he has lost muscle mass in his right arm). Pertinent negatives include no fever, headaches, leg pain, numbness, pain with swallowing, photophobia, syncope, tingling, trouble swallowing, visual change, weakness or weight loss. He has tried acetaminophen for the symptoms. The treatment provided mild relief.      Review of Systems  Constitutional: Negative.  Negative for fever, chills, weight loss, diaphoresis, appetite change and fatigue.  HENT: Negative.  Negative for trouble swallowing.   Eyes: Negative.  Negative for photophobia.  Respiratory: Positive for apnea (heavy snoring). Negative for cough, choking, chest tightness, shortness of breath, wheezing and stridor.   Cardiovascular: Positive for chest pain (he has had a pain over the right lower chest wall for several months). Negative for palpitations, leg swelling and syncope.  Gastrointestinal: Negative.  Negative for nausea, vomiting, abdominal pain, diarrhea, constipation and blood in stool.  Endocrine: Negative.   Genitourinary: Negative.  Negative for decreased urine volume, penile swelling, scrotal swelling, difficulty urinating and testicular pain.  Musculoskeletal: Positive for neck pain. Negative for arthralgias, back pain, gait problem, joint swelling, myalgias and neck stiffness.  Skin: Negative.     Allergic/Immunologic: Negative.   Neurological: Negative.  Negative for tingling, weakness, numbness and headaches.  Hematological: Negative.  Negative for adenopathy. Does not bruise/bleed easily.  Psychiatric/Behavioral: Negative.        Objective:   Physical Exam  Vitals reviewed. Constitutional: He is oriented to person, place, and time. He appears well-developed and well-nourished. No distress.  HENT:  Head: Normocephalic and atraumatic.  Mouth/Throat: Oropharynx is clear and moist. No oropharyngeal exudate.  Eyes: Conjunctivae are normal. Right eye exhibits no discharge. Left eye exhibits no discharge. No scleral icterus.  Neck: Normal range of motion. Neck supple. No JVD present. No tracheal deviation present. No thyromegaly present.  Cardiovascular: Normal rate, regular rhythm, normal heart sounds and intact distal pulses.  Exam reveals no gallop and no friction rub.   No murmur heard. Pulmonary/Chest: Effort normal and breath sounds normal. No accessory muscle usage or stridor. Not tachypneic. No respiratory distress. He has no decreased breath sounds. He has no wheezes. He has no rhonchi. He has no rales. Chest wall is not dull to percussion. He exhibits tenderness and bony tenderness. He exhibits no mass, no crepitus, no edema, no deformity and no swelling. Right breast exhibits no inverted nipple, no mass, no nipple discharge, no skin change and no tenderness. Left breast exhibits no inverted nipple, no mass, no nipple discharge, no skin change and no tenderness. Breasts are symmetrical.    Abdominal: Soft. Bowel sounds are normal. He exhibits no distension and no mass. There is no tenderness. There is no rebound and no guarding. Hernia confirmed negative in the right inguinal area and confirmed negative in the left inguinal area.  Genitourinary: Rectum normal, testes normal  and penis normal. Rectal exam shows no external hemorrhoid, no internal hemorrhoid, no fissure, no mass, no  tenderness and anal tone normal. Guaiac negative stool. Prostate is enlarged (1+ smooth symm BPH). Prostate is not tender. Right testis shows no mass, no swelling and no tenderness. Right testis is descended. Left testis shows no mass, no swelling and no tenderness. Left testis is descended. Circumcised. No penile erythema or penile tenderness. No discharge found.  Musculoskeletal: Normal range of motion. He exhibits no edema and no tenderness.       Cervical back: Normal.  Lymphadenopathy:    He has no cervical adenopathy.       Right: No inguinal adenopathy present.       Left: No inguinal adenopathy present.  Neurological: He is alert and oriented to person, place, and time. He has normal strength. He displays no atrophy, no tremor and normal reflexes. No cranial nerve deficit or sensory deficit. He exhibits normal muscle tone. He displays a negative Romberg sign. He displays no seizure activity. Coordination and gait normal. He displays no Babinski's sign on the right side. He displays no Babinski's sign on the left side.  Reflex Scores:      Tricep reflexes are 1+ on the right side and 1+ on the left side.      Bicep reflexes are 1+ on the right side and 1+ on the left side.      Brachioradialis reflexes are 1+ on the right side and 1+ on the left side.      Patellar reflexes are 1+ on the right side and 1+ on the left side.      Achilles reflexes are 1+ on the right side and 1+ on the left side. Skin: Skin is warm and dry. No rash noted. He is not diaphoretic. No erythema. No pallor.  Psychiatric: He has a normal mood and affect. His behavior is normal. Judgment and thought content normal.     Lab Results  Component Value Date   WBC 6.4 10/10/2012   HGB 14.4 10/10/2012   HCT 43.0 10/10/2012   PLT 180.0 10/10/2012   GLUCOSE 83 10/10/2012   CHOL 124 10/10/2012   TRIG 84.0 10/10/2012   HDL 33.80* 10/10/2012   LDLCALC 73 10/10/2012   ALT 20 10/10/2012   AST 22 10/10/2012   NA 139 10/10/2012    K 4.9 10/10/2012   CL 107 10/10/2012   CREATININE 1.2 10/10/2012   BUN 18 10/10/2012   CO2 29 10/10/2012   TSH 1.78 10/10/2012   PSA 1.62 10/10/2012   INR 1.06 06/25/2012   HGBA1C 6.5 10/10/2012       Assessment & Plan:

## 2013-11-20 NOTE — Progress Notes (Signed)
Pre visit review using our clinic review tool, if applicable. No additional management support is needed unless otherwise documented below in the visit note. 

## 2013-11-20 NOTE — Patient Instructions (Signed)
Health Maintenance A healthy lifestyle and preventative care can promote health and wellness.  Maintain regular health, dental, and eye exams.  Eat a healthy diet. Foods like vegetables, fruits, whole grains, low-fat dairy products, and lean protein foods contain the nutrients you need and are low in calories. Decrease your intake of foods high in solid fats, added sugars, and salt. Get information about a proper diet from your health care provider, if necessary.  Regular physical exercise is one of the most important things you can do for your health. Most adults should get at least 150 minutes of moderate-intensity exercise (any activity that increases your heart rate and causes you to sweat) each week. In addition, most adults need muscle-strengthening exercises on 2 or more days a week.   Maintain a healthy weight. The body mass index (BMI) is a screening tool to identify possible weight problems. It provides an estimate of body fat based on height and weight. Your health care provider can find your BMI and can help you achieve or maintain a healthy weight. For males 20 years and older:  A BMI below 18.5 is considered underweight.  A BMI of 18.5 to 24.9 is normal.  A BMI of 25 to 29.9 is considered overweight.  A BMI of 30 and above is considered obese.  Maintain normal blood lipids and cholesterol by exercising and minimizing your intake of saturated fat. Eat a balanced diet with plenty of fruits and vegetables. Blood tests for lipids and cholesterol should begin at age 20 and be repeated every 5 years. If your lipid or cholesterol levels are high, you are over age 50, or you are at high risk for heart disease, you may need your cholesterol levels checked more frequently.Ongoing high lipid and cholesterol levels should be treated with medicines if diet and exercise are not working.  If you smoke, find out from your health care provider how to quit. If you do not use tobacco, do not  start.  Lung cancer screening is recommended for adults aged 55-80 years who are at high risk for developing lung cancer because of a history of smoking. A yearly low-dose CT scan of the lungs is recommended for people who have at least a 30-pack-year history of smoking and are current smokers or have quit within the past 15 years. A pack year of smoking is smoking an average of 1 pack of cigarettes a day for 1 year (for example, a 30-pack-year history of smoking could mean smoking 1 pack a day for 30 years or 2 packs a day for 15 years). Yearly screening should continue until the smoker has stopped smoking for at least 15 years. Yearly screening should be stopped for people who develop a health problem that would prevent them from having lung cancer treatment.  If you choose to drink alcohol, do not have more than 2 drinks per day. One drink is considered to be 12 oz (360 mL) of beer, 5 oz (150 mL) of wine, or 1.5 oz (45 mL) of liquor.  Avoid the use of street drugs. Do not share needles with anyone. Ask for help if you need support or instructions about stopping the use of drugs.  High blood pressure causes heart disease and increases the risk of stroke. Blood pressure should be checked at least every 1-2 years. Ongoing high blood pressure should be treated with medicines if weight loss and exercise are not effective.  If you are 45-79 years old, ask your health care provider if   you should take aspirin to prevent heart disease.  Diabetes screening involves taking a blood sample to check your fasting blood sugar level. This should be done once every 3 years after age 45 if you are at a normal weight and without risk factors for diabetes. Testing should be considered at a younger age or be carried out more frequently if you are overweight and have at least 1 risk factor for diabetes.  Colorectal cancer can be detected and often prevented. Most routine colorectal cancer screening begins at the age of 50  and continues through age 75. However, your health care provider may recommend screening at an earlier age if you have risk factors for colon cancer. On a yearly basis, your health care provider may provide home test kits to check for hidden blood in the stool. A small camera at the end of a tube may be used to directly examine the colon (sigmoidoscopy or colonoscopy) to detect the earliest forms of colorectal cancer. Talk to your health care provider about this at age 50 when routine screening begins. A direct exam of the colon should be repeated every 5-10 years through age 75, unless early forms of precancerous polyps or small growths are found.  People who are at an increased risk for hepatitis B should be screened for this virus. You are considered at high risk for hepatitis B if:  You were born in a country where hepatitis B occurs often. Talk with your health care provider about which countries are considered high risk.  Your parents were born in a high-risk country and you have not received a shot to protect against hepatitis B (hepatitis B vaccine).  You have HIV or AIDS.  You use needles to inject street drugs.  You live with, or have sex with, someone who has hepatitis B.  You are a man who has sex with other men (MSM).  You get hemodialysis treatment.  You take certain medicines for conditions like cancer, organ transplantation, and autoimmune conditions.  Hepatitis C blood testing is recommended for all people born from 1945 through 1965 and any individual with known risk factors for hepatitis C.  Healthy men should no longer receive prostate-specific antigen (PSA) blood tests as part of routine cancer screening. Talk to your health care provider about prostate cancer screening.  Testicular cancer screening is not recommended for adolescents or adult males who have no symptoms. Screening includes self-exam, a health care provider exam, and other screening tests. Consult with your  health care provider about any symptoms you have or any concerns you have about testicular cancer.  Practice safe sex. Use condoms and avoid high-risk sexual practices to reduce the spread of sexually transmitted infections (STIs).  You should be screened for STIs, including gonorrhea and chlamydia if:  You are sexually active and are younger than 24 years.  You are older than 24 years, and your health care provider tells you that you are at risk for this type of infection.  Your sexual activity has changed since you were last screened, and you are at an increased risk for chlamydia or gonorrhea. Ask your health care provider if you are at risk.  If you are at risk of being infected with HIV, it is recommended that you take a prescription medicine daily to prevent HIV infection. This is called pre-exposure prophylaxis (PrEP). You are considered at risk if:  You are a man who has sex with other men (MSM).  You are a heterosexual man who   is sexually active with multiple partners.  You take drugs by injection.  You are sexually active with a partner who has HIV.  Talk with your health care provider about whether you are at high risk of being infected with HIV. If you choose to begin PrEP, you should first be tested for HIV. You should then be tested every 3 months for as long as you are taking PrEP.  Use sunscreen. Apply sunscreen liberally and repeatedly throughout the day. You should seek shade when your shadow is shorter than you. Protect yourself by wearing long sleeves, pants, a wide-brimmed hat, and sunglasses year round whenever you are outdoors.  Tell your health care provider of new moles or changes in moles, especially if there is a change in shape or color. Also, tell your health care provider if a mole is larger than the size of a pencil eraser.  A one-time screening for abdominal aortic aneurysm (AAA) and surgical repair of large AAAs by ultrasound is recommended for men aged  65-75 years who are current or former smokers.  Stay current with your vaccines (immunizations). Document Released: 07/11/2007 Document Revised: 01/17/2013 Document Reviewed: 06/09/2010 [employer] Patient Information 2015 [employer]. This information is not intended to replace advice given to you by your health care provider. Make sure you discuss any questions you have with your health care provider. Type 2 Diabetes Mellitus Type 2 diabetes mellitus, often simply referred to as type 2 diabetes, is a long-lasting (chronic) disease. In type 2 diabetes, the pancreas does not make enough insulin (a hormone), the cells are less responsive to the insulin that is made (insulin resistance), or both. Normally, insulin moves sugars from food into the tissue cells. The tissue cells use the sugars for energy. The lack of insulin or the lack of normal response to insulin causes excess sugars to build up in the blood instead of going into the tissue cells. As a result, high blood sugar (hyperglycemia) develops. The effect of high sugar (glucose) levels can cause many complications. Type 2 diabetes was also previously called adult-onset diabetes, but it can occur at any age.  RISK FACTORS  A person is predisposed to developing type 2 diabetes if someone in the family has the disease and also has one or more of the following primary risk factors:  Overweight.  An inactive lifestyle.  A history of consistently eating high-calorie foods. Maintaining a normal weight and regular physical activity can reduce the chance of developing type 2 diabetes. SYMPTOMS  A person with type 2 diabetes may not show symptoms initially. The symptoms of type 2 diabetes appear slowly. The symptoms include:  Increased thirst (polydipsia).  Increased urination (polyuria).  Increased urination during the night (nocturia).  Weight loss. This weight loss may be rapid.  Frequent, recurring infections.  Tiredness  (fatigue).  Weakness.  Vision changes, such as blurred vision.  Fruity smell to your breath.  Abdominal pain.  Nausea or vomiting.  Cuts or bruises which are slow to heal.  Tingling or numbness in the hands or feet. DIAGNOSIS Type 2 diabetes is frequently not diagnosed until complications of diabetes are present. Type 2 diabetes is diagnosed when symptoms or complications are present and when blood glucose levels are increased. Your blood glucose level may be checked by one or more of the following blood tests:  A fasting blood glucose test. You will not be allowed to eat for at least 8 hours before a blood sample is taken.  A random blood   glucose test. Your blood glucose is checked at any time of the day regardless of when you ate.  A hemoglobin A1c blood glucose test. A hemoglobin A1c test provides information about blood glucose control over the previous 3 months.  An oral glucose tolerance test (OGTT). Your blood glucose is measured after you have not eaten (fasted) for 2 hours and then after you drink a glucose-containing beverage. TREATMENT   You may need to take insulin or diabetes medicine daily to keep blood glucose levels in the desired range.  If you use insulin, you may need to adjust the dosage depending on the carbohydrates that you eat with each meal or snack. The treatment goal is to maintain the before meal blood sugar (preprandial glucose) level at 70-130 mg/dL. HOME CARE INSTRUCTIONS   Have your hemoglobin A1c level checked twice a year.  Perform daily blood glucose monitoring as directed by your health care provider.  Monitor urine ketones when you are ill and as directed by your health care provider.  Take your diabetes medicine or insulin as directed by your health care provider to maintain your blood glucose levels in the desired range.  Never run out of diabetes medicine or insulin. It is needed every day.  If you are using insulin, you may need to  adjust the amount of insulin given based on your intake of carbohydrates. Carbohydrates can raise blood glucose levels but need to be included in your diet. Carbohydrates provide vitamins, minerals, and fiber which are an essential part of a healthy diet. Carbohydrates are found in fruits, vegetables, whole grains, dairy products, legumes, and foods containing added sugars.  Eat healthy foods. You should make an appointment to see a registered dietitian to help you create an eating plan that is right for you.  Lose weight if you are overweight.  Carry a medical alert card or wear your medical alert jewelry.  Carry a 15-gram carbohydrate snack with you at all times to treat low blood glucose (hypoglycemia). Some examples of 15-gram carbohydrate snacks include:  Glucose tablets, 3 or 4.  Glucose gel, 15-gram tube.  Raisins, 2 tablespoons (24 grams).  Jelly beans, 6.  Animal crackers, 8.  Regular pop, 4 ounces (120 mL).  Gummy treats, 9.  Recognize hypoglycemia. Hypoglycemia occurs with blood glucose levels of 70 mg/dL and below. The risk for hypoglycemia increases when fasting or skipping meals, during or after intense exercise, and during sleep. Hypoglycemia symptoms can include:  Tremors or shakes.  Decreased ability to concentrate.  Sweating.  Increased heart rate.  Headache.  Dry mouth.  Hunger.  Irritability.  Anxiety.  Restless sleep.  Altered speech or coordination.  Confusion.  Treat hypoglycemia promptly. If you are alert and able to safely swallow, follow the 15:15 rule:  Take 15-20 grams of rapid-acting glucose or carbohydrate. Rapid-acting options include glucose gel, glucose tablets, or 4 ounces (120 mL) of fruit juice, regular soda, or low-fat milk.  Check your blood glucose level 15 minutes after taking the glucose.  Take 15-20 grams more of glucose if the repeat blood glucose level is still 70 mg/dL or below.  Eat a meal or snack within 1 hour  once blood glucose levels return to normal.  Be alert to feeling very thirsty and urinating more frequently than usual, which are early signs of hyperglycemia. An early awareness of hyperglycemia allows for prompt treatment. Treat hyperglycemia as directed by your health care provider.  Engage in at least 150 minutes of moderate-intensity physical activity   a week, spread over at least 3 days of the week or as directed by your health care provider. In addition, you should engage in resistance exercise at least 2 times a week or as directed by your health care provider. Try to spend no more than 90 minutes at one time inactive.  Adjust your medicine and food intake as needed if you start a new exercise or sport.  Follow your sick-day plan anytime you are unable to eat or drink as usual.  Do not use any tobacco products including cigarettes, chewing tobacco, or electronic cigarettes. If you need help quitting, ask your health care provider.  Limit alcohol intake to no more than 1 drink per day for nonpregnant women and 2 drinks per day for men. You should drink alcohol only when you are also eating food. Talk with your health care provider whether alcohol is safe for you. Tell your health care provider if you drink alcohol several times a week.  Keep all follow-up visits as directed by your health care provider. This is important.  Schedule an eye exam soon after the diagnosis of type 2 diabetes and then annually.  Perform daily skin and foot care. Examine your skin and feet daily for cuts, bruises, redness, nail problems, bleeding, blisters, or sores. A foot exam by a health care provider should be done annually.  Brush your teeth and gums at least twice a day and floss at least once a day. Follow up with your dentist regularly.  Share your diabetes management plan with your workplace or school.  Stay up-to-date with immunizations. It is recommended that people with diabetes who are over 65  years old get the pneumonia vaccine. In some cases, two separate shots may be given. Ask your health care provider if your pneumonia vaccination is up-to-date.  Learn to manage stress.  Obtain ongoing diabetes education and support as needed.  Participate in or seek rehabilitation as needed to maintain or improve independence and quality of life. Request a physical or occupational therapy referral if you are having foot or hand numbness, or difficulties with grooming, dressing, eating, or physical activity. SEEK MEDICAL CARE IF:   You are unable to eat food or drink fluids for more than 6 hours.  You have nausea and vomiting for more than 6 hours.  Your blood glucose level is over 240 mg/dL.  There is a change in mental status.  You develop an additional serious illness.  You have diarrhea for more than 6 hours.  You have been sick or have had a fever for a couple of days and are not getting better.  You have pain during any physical activity.  SEEK IMMEDIATE MEDICAL CARE IF:  You have difficulty breathing.  You have moderate to large ketone levels. MAKE SURE YOU:  Understand these instructions.  Will watch your condition.  Will get help right away if you are not doing well or get worse. Document Released: 01/12/2005 Document Revised: 05/29/2013 Document Reviewed: 08/11/2011 [employer] Patient Information 2015 [employer]. This information is not intended to replace advice given to you by your health care provider. Make sure you discuss any questions you have with your health care provider.  

## 2013-11-21 ENCOUNTER — Telehealth: Payer: Self-pay | Admitting: Internal Medicine

## 2013-11-21 ENCOUNTER — Telehealth: Payer: Self-pay

## 2013-11-21 NOTE — Assessment & Plan Note (Signed)
His plain films are WNL, will consider getting an MRI done

## 2013-11-21 NOTE — Assessment & Plan Note (Signed)
His A1C has come down quite a bit He will cont to work on his lifestyle modifications

## 2013-11-21 NOTE — Assessment & Plan Note (Addendum)

## 2013-11-21 NOTE — Telephone Encounter (Signed)
emmi emailed °

## 2013-11-21 NOTE — Assessment & Plan Note (Signed)
Will refer for a sleep evaluation

## 2013-11-21 NOTE — Assessment & Plan Note (Signed)
Exam is normal Plain films shows nipple shadows but are otherwise normal Will follow for now

## 2013-11-21 NOTE — Assessment & Plan Note (Signed)
His BP is not well controlled but he does not want to add another medication at this time He will work on his lifestyle modifications and will get checked for OSA Will recheck his BP at next visit

## 2013-11-21 NOTE — Telephone Encounter (Signed)
Called pt and informed that wellness form is ready to be picked up.   I have placed it in the box with the rx's.

## 2013-11-24 ENCOUNTER — Ambulatory Visit (INDEPENDENT_AMBULATORY_CARE_PROVIDER_SITE_OTHER)
Admission: RE | Admit: 2013-11-24 | Discharge: 2013-11-24 | Disposition: A | Discharge status: Home / Self Care | Payer: Managed Care, Other (non HMO) | Source: Ambulatory Visit | Attending: Internal Medicine | Admitting: Internal Medicine

## 2013-11-24 DIAGNOSIS — I10 Essential (primary) hypertension: Secondary | ICD-10-CM

## 2013-11-24 DIAGNOSIS — Z0000 Encounter for general adult medical examination without abnormal findings: Secondary | ICD-10-CM

## 2013-11-26 ENCOUNTER — Encounter: Payer: Self-pay | Admitting: Internal Medicine

## 2014-01-22 ENCOUNTER — Other Ambulatory Visit: Payer: Self-pay | Admitting: Internal Medicine

## 2014-03-23 ENCOUNTER — Other Ambulatory Visit: Payer: Self-pay | Admitting: Internal Medicine

## 2014-04-20 ENCOUNTER — Other Ambulatory Visit: Payer: Self-pay | Admitting: Internal Medicine

## 2014-07-23 ENCOUNTER — Other Ambulatory Visit: Payer: Self-pay

## 2014-10-12 ENCOUNTER — Other Ambulatory Visit: Payer: Self-pay | Admitting: Internal Medicine

## 2014-10-17 ENCOUNTER — Telehealth: Payer: Self-pay | Admitting: Internal Medicine

## 2014-10-17 MED ORDER — PANTOPRAZOLE SODIUM 40 MG PO TBEC: 40.0000 mg | DELAYED_RELEASE_TABLET | Freq: Every day | ORAL | Status: DC

## 2014-10-17 MED ORDER — LOSARTAN POTASSIUM 100 MG PO TABS: 100.0000 mg | ORAL_TABLET | Freq: Every day | ORAL | Status: DC

## 2014-10-17 NOTE — Telephone Encounter (Signed)
30 day sent to [facility] until appt...Brett Robles

## 2014-10-17 NOTE — Telephone Encounter (Signed)
Patient has an appt next month  Please issue a script for pantoprazole (PROTONIX) 40 MG tablet [[id number]] and losartan (COZAAR) 100 MG tablet [[id number]] to [facility] in [place].

## 2014-11-19 ENCOUNTER — Encounter: Payer: Self-pay | Admitting: Internal Medicine

## 2014-11-19 ENCOUNTER — Other Ambulatory Visit (INDEPENDENT_AMBULATORY_CARE_PROVIDER_SITE_OTHER): Payer: Managed Care, Other (non HMO)

## 2014-11-19 ENCOUNTER — Ambulatory Visit (INDEPENDENT_AMBULATORY_CARE_PROVIDER_SITE_OTHER): Payer: Managed Care, Other (non HMO) | Admitting: Internal Medicine

## 2014-11-19 VITALS — BP 122/68 | HR 56 | Temp 97.6°F | Resp 16 | Wt 170.0 lb

## 2014-11-19 DIAGNOSIS — E785 Hyperlipidemia, unspecified: Secondary | ICD-10-CM | POA: Diagnosis not present

## 2014-11-19 DIAGNOSIS — N5201 Erectile dysfunction due to arterial insufficiency: Secondary | ICD-10-CM

## 2014-11-19 DIAGNOSIS — T39395A Adverse effect of other nonsteroidal anti-inflammatory drugs [NSAID], initial encounter: Secondary | ICD-10-CM

## 2014-11-19 DIAGNOSIS — R739 Hyperglycemia, unspecified: Secondary | ICD-10-CM

## 2014-11-19 DIAGNOSIS — Z23 Encounter for immunization: Secondary | ICD-10-CM | POA: Diagnosis not present

## 2014-11-19 DIAGNOSIS — N400 Enlarged prostate without lower urinary tract symptoms: Secondary | ICD-10-CM

## 2014-11-19 DIAGNOSIS — K269 Duodenal ulcer, unspecified as acute or chronic, without hemorrhage or perforation: Secondary | ICD-10-CM

## 2014-11-19 DIAGNOSIS — I10 Essential (primary) hypertension: Secondary | ICD-10-CM

## 2014-11-19 DIAGNOSIS — N50819 Testicular pain, unspecified: Secondary | ICD-10-CM

## 2014-11-19 DIAGNOSIS — Z0000 Encounter for general adult medical examination without abnormal findings: Secondary | ICD-10-CM

## 2014-11-19 DIAGNOSIS — N522 Drug-induced erectile dysfunction: Secondary | ICD-10-CM

## 2014-11-19 LAB — COMPREHENSIVE METABOLIC PANEL
ALT: 38 U/L (ref 0–53)
AST: 26 U/L (ref 0–37)
Albumin: 4.4 g/dL (ref 3.5–5.2)
Alkaline Phosphatase: 83 U/L (ref 39–117)
BUN: 21 mg/dL (ref 6–23)
CO2: 27 meq/L (ref 19–32)
Calcium: 9.8 mg/dL (ref 8.4–10.5)
Chloride: 109 mEq/L (ref 96–112)
Creatinine, Ser: 1.34 mg/dL (ref 0.40–1.50)
GFR: 56.58 mL/min — AB (ref >60.00)
GLUCOSE: 98 mg/dL (ref 70–99)
POTASSIUM: 4.4 meq/L (ref 3.5–5.1)
SODIUM: 143 meq/L (ref 135–145)
Total Bilirubin: 1.7 mg/dL — ABNORMAL HIGH (ref 0.2–1.2)
Total Protein: 7.2 g/dL (ref 6.0–8.3)

## 2014-11-19 LAB — LIPID PANEL
CHOLESTEROL: 157 mg/dL (ref 0–200)
HDL: 41.5 mg/dL (ref >39.00)
LDL CALC: 101 mg/dL — AB (ref 0–99)
NonHDL: 115.36
TRIGLYCERIDES: 72 mg/dL (ref 0.0–149.0)
Total CHOL/HDL Ratio: 4
VLDL: 14.4 mg/dL (ref 0.0–40.0)

## 2014-11-19 LAB — CBC WITH DIFFERENTIAL/PLATELET
Basophils Absolute: 0 10*3/uL (ref 0.0–0.1)
Basophils Relative: 0.6 % (ref 0.0–3.0)
EOS PCT: 2.4 % (ref 0.0–5.0)
Eosinophils Absolute: 0.2 10*3/uL (ref 0.0–0.7)
HCT: 47.5 % (ref 39.0–52.0)
Hemoglobin: 16 g/dL (ref 13.0–17.0)
LYMPHS ABS: 2.7 10*3/uL (ref 0.7–4.0)
Lymphocytes Relative: 33.3 % (ref 12.0–46.0)
MCHC: 33.7 g/dL (ref 30.0–36.0)
MCV: 91.3 fl (ref 78.0–100.0)
MONOS PCT: 9.6 % (ref 3.0–12.0)
Monocytes Absolute: 0.8 10*3/uL (ref 0.1–1.0)
NEUTROS ABS: 4.3 10*3/uL (ref 1.4–7.7)
NEUTROS PCT: 54.1 % (ref 43.0–77.0)
PLATELETS: 214 10*3/uL (ref 150.0–400.0)
RBC: 5.2 Mil/uL (ref 4.22–5.81)
RDW: 13.6 % (ref 11.5–15.5)
WBC: 8 10*3/uL (ref 4.0–10.5)

## 2014-11-19 LAB — URINALYSIS, ROUTINE W REFLEX MICROSCOPIC
BILIRUBIN URINE: NEGATIVE
Hgb urine dipstick: NEGATIVE
Ketones, ur: NEGATIVE
Leukocytes, UA: NEGATIVE
Nitrite: NEGATIVE
PH: 7.5 (ref 5.0–8.0)
RBC / HPF: NONE SEEN (ref 0–?)
SPECIFIC GRAVITY, URINE: 1.015 (ref 1.000–1.030)
Total Protein, Urine: NEGATIVE
Urine Glucose: NEGATIVE
Urobilinogen, UA: 0.2 (ref 0.0–1.0)
WBC UA: NONE SEEN (ref 0–?)

## 2014-11-19 LAB — PSA: PSA: 2.18 ng/mL (ref 0.10–4.00)

## 2014-11-19 LAB — FECAL OCCULT BLOOD, GUAIAC: FECAL OCCULT BLD: NEGATIVE

## 2014-11-19 LAB — HEMOGLOBIN A1C: Hgb A1c MFr Bld: 5.8 % (ref 4.6–6.5)

## 2014-11-19 LAB — TSH: TSH: 1.95 u[IU]/mL (ref 0.35–4.50)

## 2014-11-19 MED ORDER — LOSARTAN POTASSIUM 100 MG PO TABS: 100.0000 mg | ORAL_TABLET | Freq: Every day | ORAL | Status: DC

## 2014-11-19 MED ORDER — TADALAFIL 20 MG PO TABS: 20.0000 mg | ORAL_TABLET | Freq: Every day | ORAL | Status: DC | PRN

## 2014-11-19 MED ORDER — PANTOPRAZOLE SODIUM 40 MG PO TBEC: 40.0000 mg | DELAYED_RELEASE_TABLET | Freq: Every day | ORAL | Status: DC

## 2014-11-19 NOTE — Assessment & Plan Note (Signed)

## 2014-11-19 NOTE — Progress Notes (Signed)
Subjective:  Patient ID: Brett Robles, male    DOB: 1950-02-13  Age: 64 y.o. MRN: [id number]  CC: Annual Exam; Hypertension; and Anemia   HPI BRITON SELLMAN presents for a CPX - he complains of right testicular pain.  Outpatient Prescriptions Prior to Visit  Medication Sig Dispense Refill  . albuterol (PROVENTIL HFA;VENTOLIN HFA) 108 (90 BASE) MCG/ACT inhaler Inhale 2 puffs into the lungs every 6 (six) hours as needed for wheezing. For wheezing/shortness of breath    . dorzolamide-timolol (COSOPT) 22.3-6.8 MG/ML ophthalmic solution Place 1 drop into both eyes 2 (two) times daily.    . ferrous sulfate 325 (65 FE) MG tablet Take 1 tablet (325 mg total) by mouth daily with breakfast. 30 tablet 5  . latanoprost (XALATAN) 0.005 % ophthalmic solution Place 1 drop into the right eye daily.    . Multiple Vitamin (MULTIVITAMIN WITH MINERALS) TABS Take 1 tablet by mouth daily.    Marland Kitchen losartan (COZAAR) 100 MG tablet Take 1 tablet (100 mg total) by mouth daily. 30 tablet 0  . pantoprazole (PROTONIX) 40 MG tablet Take 1 tablet (40 mg total) by mouth daily. 30 tablet 0  . tadalafil (CIALIS) 20 MG tablet Take 1 tablet (20 mg total) by mouth daily as needed for erectile dysfunction. (Patient not taking: Reported on 11/19/2014) 10 tablet 11   No facility-administered medications prior to visit.    ROS Review of Systems  Constitutional: Negative for fever, chills, diaphoresis, activity change, appetite change, fatigue and unexpected weight change.  HENT: Negative.   Eyes: Negative.   Respiratory: Negative.  Negative for cough, choking, chest tightness, shortness of breath and stridor.   Cardiovascular: Negative for chest pain, palpitations and leg swelling.  Gastrointestinal: Negative.  Negative for nausea, vomiting, abdominal pain, diarrhea, constipation and blood in stool.  Endocrine: Negative.   Genitourinary: Positive for testicular pain. Negative for dysuria, urgency, frequency, hematuria, flank  pain, decreased urine volume, discharge, penile swelling, scrotal swelling, enuresis, difficulty urinating, genital sores and penile pain.       He complains of right testicular pain for 3-4 months  Musculoskeletal: Negative.  Negative for myalgias, back pain, joint swelling and arthralgias.  Skin: Negative.  Negative for color change, pallor and rash.  Allergic/Immunologic: Negative.   Neurological: Negative.  Negative for dizziness.  Hematological: Negative.  Negative for adenopathy. Does not bruise/bleed easily.  Psychiatric/Behavioral: Negative.     Objective:  BP 122/68 mmHg  Pulse 56  Temp(Src) 97.6 F (36.4 C) (Oral)  Resp 16  Wt 170 lb (77.111 kg)  SpO2 98%  BP Readings from Last 3 Encounters:  11/19/14 122/68  11/20/13 150/96  10/10/12 148/82    Wt Readings from Last 3 Encounters:  11/19/14 170 lb (77.111 kg)  11/20/13 173 lb (78.472 kg)  10/10/12 173 lb (78.472 kg)    Physical Exam  Constitutional: He is oriented to person, place, and time. He appears well-developed and well-nourished. No distress.  HENT:  Head: Normocephalic and atraumatic.  Mouth/Throat: Oropharynx is clear and moist. No oropharyngeal exudate.  Eyes: Conjunctivae are normal. Right eye exhibits no discharge. Left eye exhibits no discharge. No scleral icterus.  Neck: Normal range of motion. Neck supple. No JVD present. No tracheal deviation present. No thyromegaly present.  Cardiovascular: Normal rate, regular rhythm, normal heart sounds and intact distal pulses.  Exam reveals no gallop and no friction rub.   No murmur heard. Pulmonary/Chest: Effort normal and breath sounds normal. No stridor. No respiratory distress. He  has no wheezes. He has no rales. He exhibits no tenderness.  Abdominal: Soft. Bowel sounds are normal. He exhibits no distension and no mass. There is no tenderness. There is no rebound and no guarding. Hernia confirmed negative in the right inguinal area and confirmed negative in  the left inguinal area.  Genitourinary: Penis normal. Right testis shows mass and tenderness. Right testis shows no swelling. Right testis is descended. Left testis shows no mass, no swelling and no tenderness. Left testis is descended. Circumcised. No penile erythema or penile tenderness. No discharge found.  Rt testicle is slightly larger then the left testicle and it is mildly tender, there is a small cystic lesion over the lower pole as well.  Musculoskeletal: Normal range of motion. He exhibits no edema.  Lymphadenopathy:    He has no cervical adenopathy.       Right: No inguinal adenopathy present.       Left: No inguinal adenopathy present.  Neurological: He is oriented to person, place, and time.  Skin: Skin is warm and dry. No rash noted. He is not diaphoretic. No erythema. No pallor.  Psychiatric: He has a normal mood and affect. His behavior is normal. Judgment and thought content normal.  Vitals reviewed.   Lab Results  Component Value Date   WBC 8.0 11/19/2014   HGB 16.0 11/19/2014   HCT 47.5 11/19/2014   PLT 214.0 11/19/2014   GLUCOSE 98 11/19/2014   CHOL 157 11/19/2014   TRIG 72.0 11/19/2014   HDL 41.50 11/19/2014   LDLCALC 101* 11/19/2014   ALT 38 11/19/2014   AST 26 11/19/2014   NA 143 11/19/2014   K 4.4 11/19/2014   CL 109 11/19/2014   CREATININE 1.34 11/19/2014   BUN 21 11/19/2014   CO2 27 11/19/2014   TSH 1.95 11/19/2014   PSA 2.18 11/19/2014   INR 1.06 06/25/2012   HGBA1C 5.8 11/19/2014    Dg Chest 2 View  11/24/2013  CLINICAL DATA:  Pulmonary nodules, most likely nipple shadows on prior chest x-ray. EXAM: CHEST  2 VIEW COMPARISON:  11/20/2013. FINDINGS: The heart size and mediastinal contours are within normal limits. Both lungs are clear. Previously identified nodular densities in lung bases represent nipple shadows as evidenced by nipple markers. The visualized skeletal structures are unremarkable. IMPRESSION: No active cardiopulmonary disease.  Previously identified nodular densities in lung bases represent nipple shadows. Electronically Signed   By: Marcello Moores  Register   On: 11/24/2013 16:23    Assessment & Plan:   Khriz was seen today for annual exam, hypertension and anemia.  Diagnoses and all orders for this visit:  Essential hypertension, benign- his BP is well controlled, lytes and renal function are stable -     Comprehensive metabolic panel; Future -     CBC with Differential/Platelet; Future -     TSH; Future -     Urinalysis, Routine w reflex microscopic (not at [facility]); Future -     losartan (COZAAR) 100 MG tablet; Take 1 tablet (100 mg total) by mouth daily.  Hyperglycemia- he has prediabetes and will work on his lifestyle modifications -     Comprehensive metabolic panel; Future -     Hemoglobin A1c; Future  Routine general medical examination at a health care facility  Erectile dysfunction due to arterial insufficiency -     PSA; Future  BPH (benign prostatic hyperplasia)- his PSA remains normal and there are no s/s that need to be treated -     PSA;  Future  Hyperlipidemia with target LDL less than 130- he has achieved his LDL goal -     TSH; Future -     Lipid panel; Future  Drug-induced erectile dysfunction -     tadalafil (CIALIS) 20 MG tablet; Take 1 tablet (20 mg total) by mouth daily as needed for erectile dysfunction.  Orchalgia- will get an U/S done to see if there is concern for testicular cancer -     US Scrotum; Future  Duodenal ulcer due to nonsteroidal anti-inflammatory drug (NSAID) -     pantoprazole (PROTONIX) 40 MG tablet; Take 1 tablet (40 mg total) by mouth daily.  Encounter for immunization  Need for prophylactic vaccination against Streptococcus pneumoniae (pneumococcus) -     Pneumococcal polysaccharide vaccine 23-valent greater than or equal to 2yo subcutaneous/IM  Other orders -     Flu vaccine HIGH DOSE PF  I am having Mr. Carel maintain his albuterol, multivitamin with  minerals, latanoprost, dorzolamide-timolol, ferrous sulfate, tadalafil, losartan, and pantoprazole.  Meds ordered this encounter  Medications  . tadalafil (CIALIS) 20 MG tablet    Sig: Take 1 tablet (20 mg total) by mouth daily as needed for erectile dysfunction.    Dispense:  10 tablet    Refill:  11  . losartan (COZAAR) 100 MG tablet    Sig: Take 1 tablet (100 mg total) by mouth daily.    Dispense:  90 tablet    Refill:  3  . pantoprazole (PROTONIX) 40 MG tablet    Sig: Take 1 tablet (40 mg total) by mouth daily.    Dispense:  90 tablet    Refill:  3     Follow-up: Return in about 6 months (around 05/20/2015).  Scarlette Calico, MD

## 2014-11-19 NOTE — Progress Notes (Signed)
Pre visit review using our clinic review tool, if applicable. No additional management support is needed unless otherwise documented below in the visit note. 

## 2014-11-19 NOTE — Patient Instructions (Signed)

## 2014-11-20 ENCOUNTER — Telehealth: Payer: Self-pay | Admitting: Internal Medicine

## 2014-11-20 ENCOUNTER — Other Ambulatory Visit: Payer: Self-pay | Admitting: Internal Medicine

## 2014-11-20 DIAGNOSIS — N50819 Testicular pain, unspecified: Secondary | ICD-10-CM

## 2014-11-20 NOTE — Telephone Encounter (Signed)
Please advise if you would like for me to add this order.

## 2014-11-20 NOTE — Telephone Encounter (Signed)
yes

## 2014-11-20 NOTE — Telephone Encounter (Signed)
[facility] needs an order to add a flow to the ultra sound Code - ING [id number]

## 2014-11-20 NOTE — Telephone Encounter (Signed)
Order has been added. Note to add the flow is in the comments section.

## 2014-11-21 ENCOUNTER — Telehealth: Payer: Self-pay | Admitting: Internal Medicine

## 2014-11-21 DIAGNOSIS — N50819 Testicular pain, unspecified: Secondary | ICD-10-CM

## 2014-11-21 NOTE — Telephone Encounter (Signed)
States needs code [phone] to be include on order.  Is also requesting call in regards.

## 2014-11-22 ENCOUNTER — Telehealth: Payer: Self-pay

## 2014-11-22 NOTE — Telephone Encounter (Signed)
Wellness screening form is complete. Will put in front drawer.

## 2014-11-22 NOTE — Telephone Encounter (Signed)
Was this on previous order?

## 2014-11-24 NOTE — Telephone Encounter (Signed)
This has been reenterd.

## 2014-12-03 ENCOUNTER — Other Ambulatory Visit: Payer: Managed Care, Other (non HMO)

## 2015-01-13 IMAGING — CR DG CHEST 2V
2 series · 2 of 2 positions shown · non-contrast
Comparison: 11/20/2013.

CLINICAL DATA: Pulmonary nodules, most likely nipple shadows on
prior chest x-ray.

EXAM:
CHEST  2 VIEW

[view not recorded (1 of 2)]
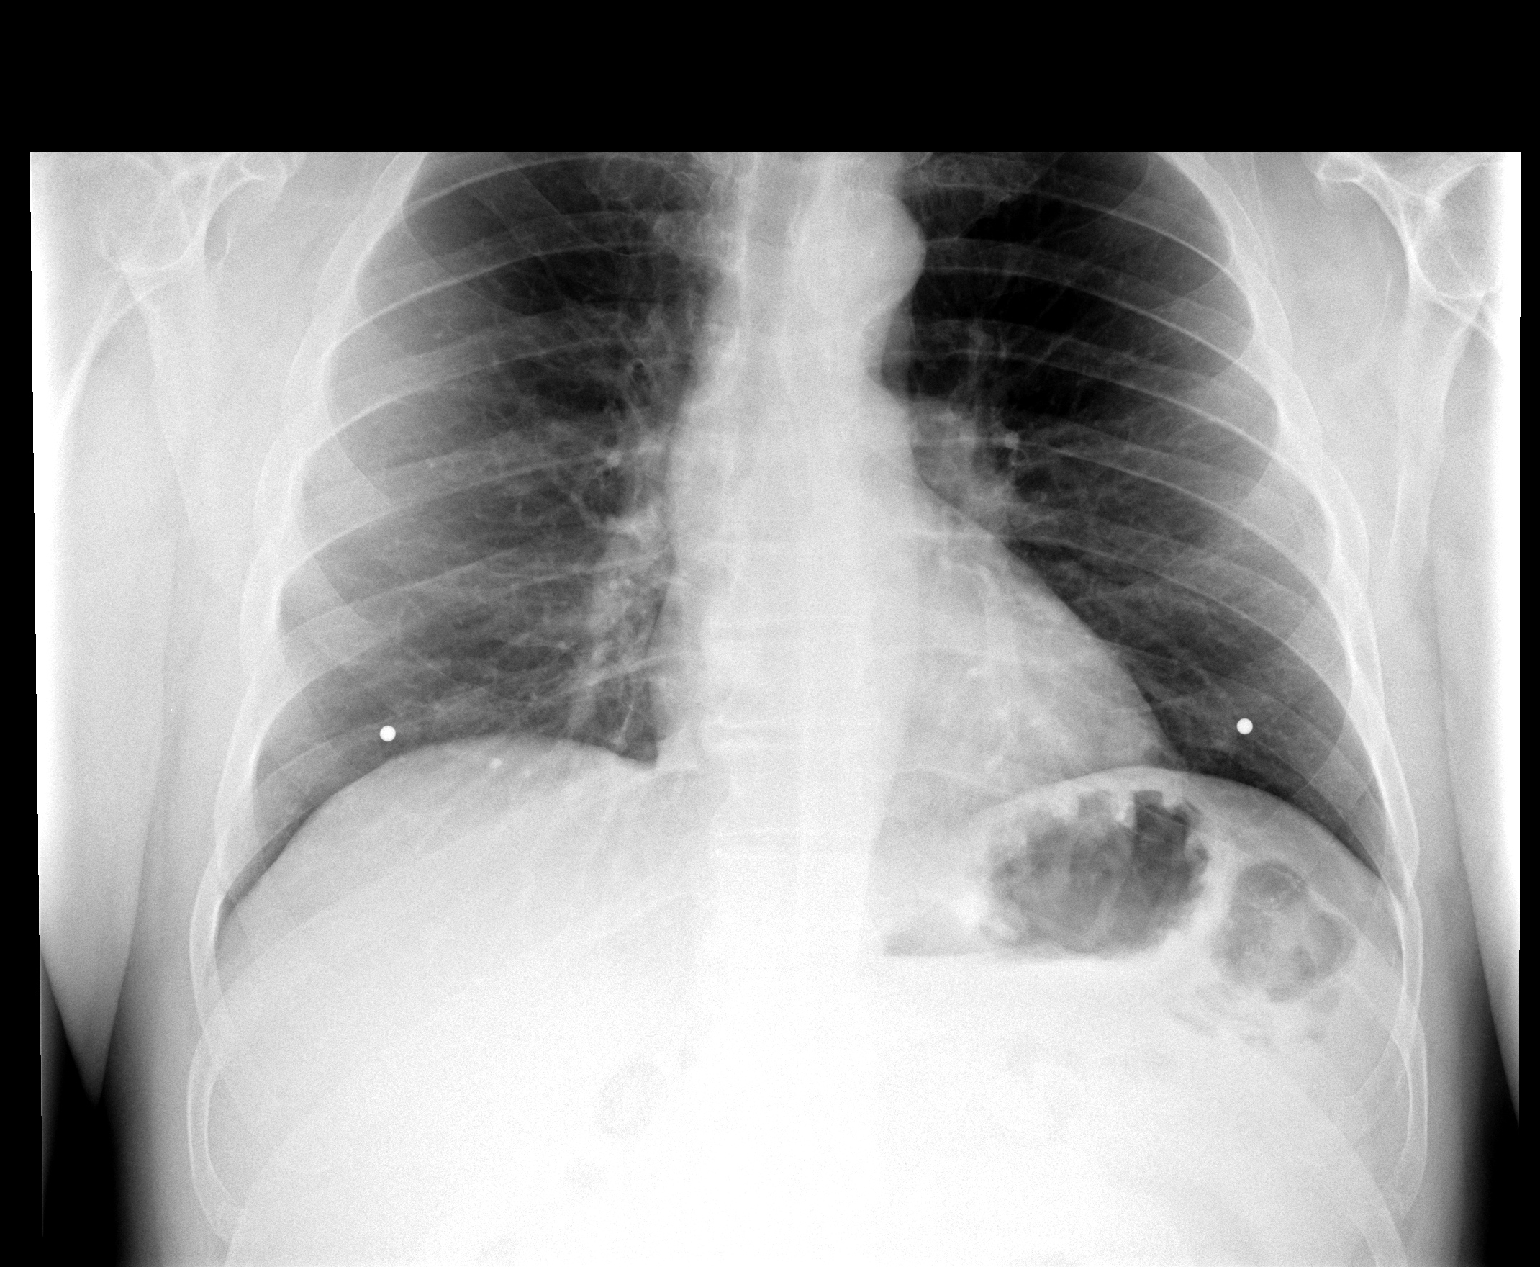

[view not recorded (2 of 2)]
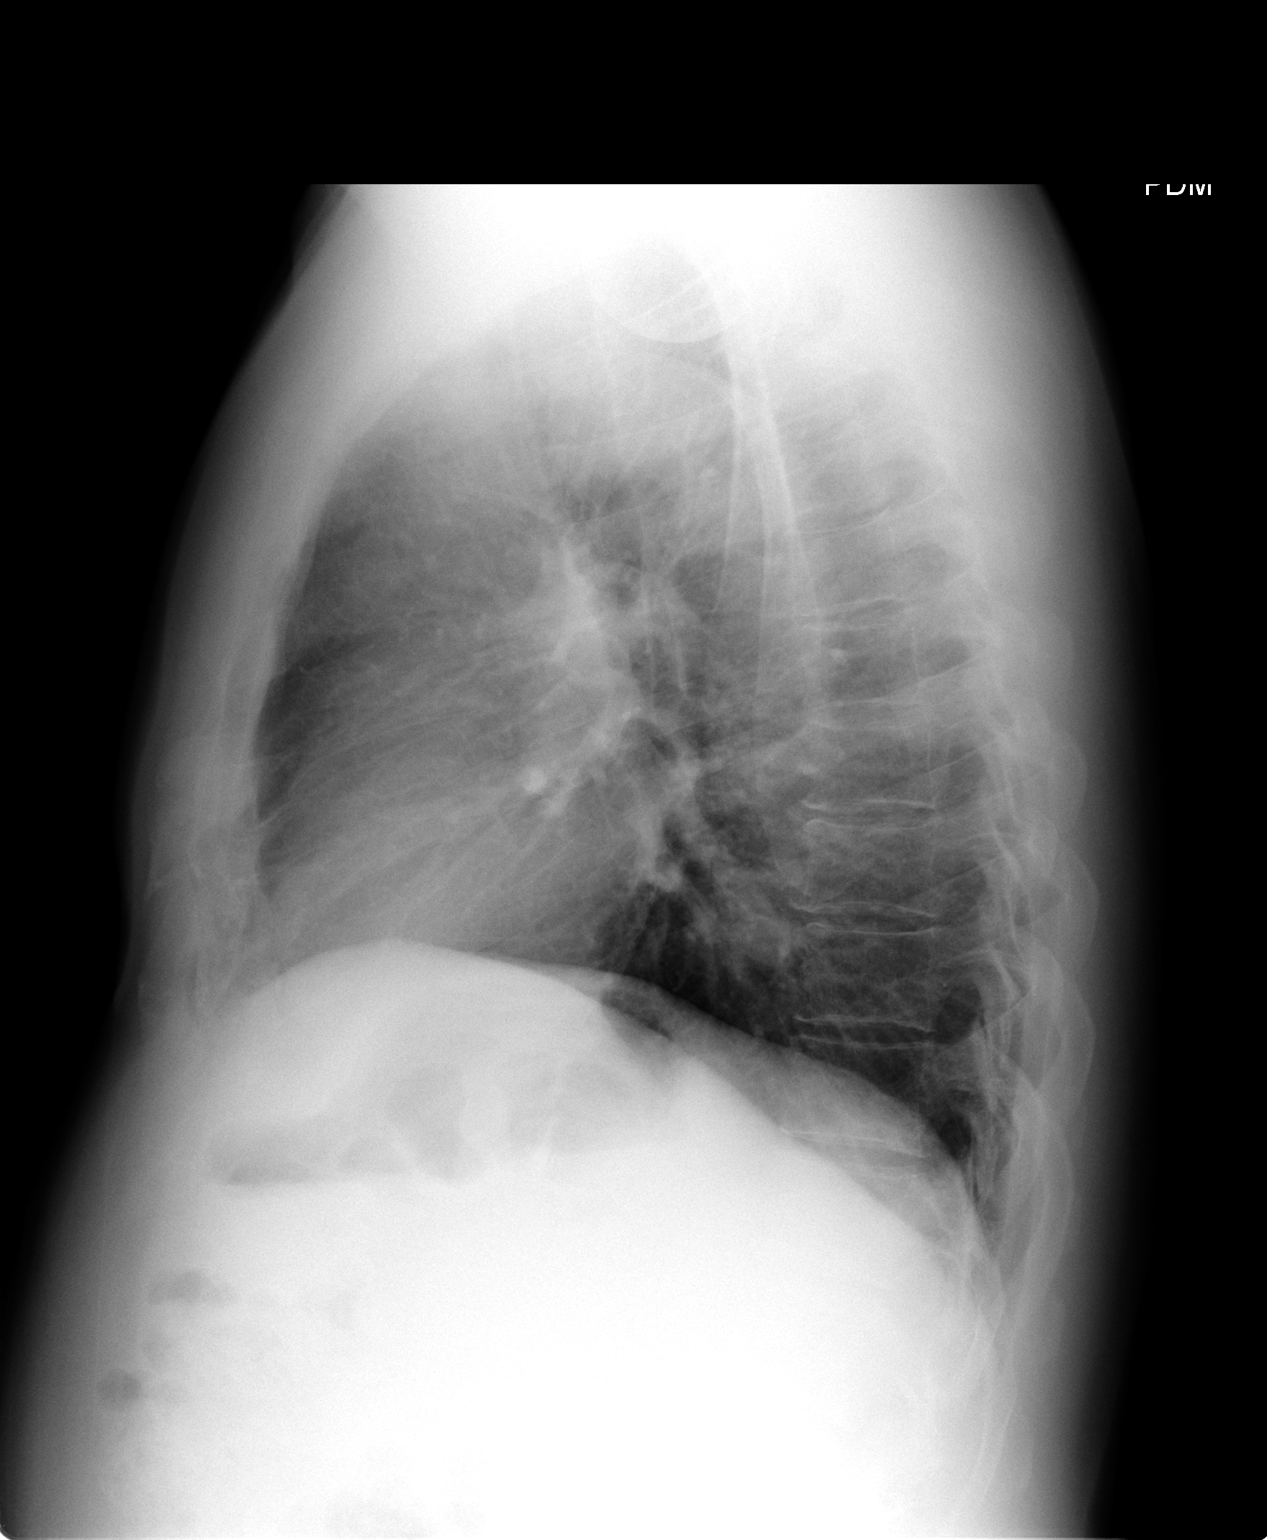

[2 of 2 positions shown; findings below may reference images not displayed]

FINDINGS: The heart size and mediastinal contours are within normal limits.
Both lungs are clear. Previously identified nodular densities in
lung bases represent nipple shadows as evidenced by nipple markers.
The visualized skeletal structures are unremarkable.
IMPRESSION: No active cardiopulmonary disease. Previously identified nodular
densities in lung bases represent nipple shadows.

## 2015-11-08 ENCOUNTER — Telehealth: Payer: Self-pay | Admitting: Internal Medicine

## 2015-11-08 DIAGNOSIS — K269 Duodenal ulcer, unspecified as acute or chronic, without hemorrhage or perforation: Secondary | ICD-10-CM

## 2015-11-08 DIAGNOSIS — T39395A Adverse effect of other nonsteroidal anti-inflammatory drugs [NSAID], initial encounter: Secondary | ICD-10-CM

## 2015-11-08 DIAGNOSIS — I10 Essential (primary) hypertension: Secondary | ICD-10-CM

## 2015-11-08 MED ORDER — PANTOPRAZOLE SODIUM 40 MG PO TBEC: 40.0000 mg | DELAYED_RELEASE_TABLET | Freq: Every day | ORAL | 0 refills | Status: DC

## 2015-11-08 MED ORDER — LOSARTAN POTASSIUM 100 MG PO TABS: 100.0000 mg | ORAL_TABLET | Freq: Every day | ORAL | 0 refills | Status: DC

## 2015-11-08 NOTE — Telephone Encounter (Signed)
Patient has scheduled CPE for October 30th.  He is currently out of pantoprazole and losartan.  Patient is requesting scripts to get him through until appt to be sent to [facility] in [place].

## 2015-11-08 NOTE — Telephone Encounter (Signed)
Sent 30 day supply to [facility] until appt

## 2015-11-25 ENCOUNTER — Other Ambulatory Visit (INDEPENDENT_AMBULATORY_CARE_PROVIDER_SITE_OTHER): Payer: Managed Care, Other (non HMO)

## 2015-11-25 ENCOUNTER — Encounter: Payer: Self-pay | Admitting: Internal Medicine

## 2015-11-25 ENCOUNTER — Ambulatory Visit (INDEPENDENT_AMBULATORY_CARE_PROVIDER_SITE_OTHER): Payer: Managed Care, Other (non HMO) | Admitting: Internal Medicine

## 2015-11-25 VITALS — BP 170/100 | HR 66 | Temp 97.9°F | Resp 16 | Ht 68.0 in | Wt 172.2 lb

## 2015-11-25 DIAGNOSIS — N400 Enlarged prostate without lower urinary tract symptoms: Secondary | ICD-10-CM

## 2015-11-25 DIAGNOSIS — D692 Other nonthrombocytopenic purpura: Secondary | ICD-10-CM

## 2015-11-25 DIAGNOSIS — I10 Essential (primary) hypertension: Secondary | ICD-10-CM

## 2015-11-25 DIAGNOSIS — E785 Hyperlipidemia, unspecified: Secondary | ICD-10-CM | POA: Diagnosis not present

## 2015-11-25 DIAGNOSIS — J4520 Mild intermittent asthma, uncomplicated: Secondary | ICD-10-CM | POA: Insufficient documentation

## 2015-11-25 DIAGNOSIS — Z23 Encounter for immunization: Secondary | ICD-10-CM

## 2015-11-25 DIAGNOSIS — N522 Drug-induced erectile dysfunction: Secondary | ICD-10-CM

## 2015-11-25 DIAGNOSIS — Z0000 Encounter for general adult medical examination without abnormal findings: Secondary | ICD-10-CM

## 2015-11-25 LAB — CBC WITH DIFFERENTIAL/PLATELET
BASOS PCT: 0.5 % (ref 0.0–3.0)
Basophils Absolute: 0 10*3/uL (ref 0.0–0.1)
EOS ABS: 0.2 10*3/uL (ref 0.0–0.7)
Eosinophils Relative: 2.2 % (ref 0.0–5.0)
HCT: 48.8 % (ref 39.0–52.0)
HEMOGLOBIN: 16.6 g/dL (ref 13.0–17.0)
LYMPHS ABS: 2.5 10*3/uL (ref 0.7–4.0)
Lymphocytes Relative: 34.2 % (ref 12.0–46.0)
MCHC: 34 g/dL (ref 30.0–36.0)
MCV: 91.2 fl (ref 78.0–100.0)
MONO ABS: 0.7 10*3/uL (ref 0.1–1.0)
Monocytes Relative: 9.1 % (ref 3.0–12.0)
NEUTROS PCT: 54 % (ref 43.0–77.0)
Neutro Abs: 4 10*3/uL (ref 1.4–7.7)
PLATELETS: 243 10*3/uL (ref 150.0–400.0)
RBC: 5.35 Mil/uL (ref 4.22–5.81)
RDW: 13.3 % (ref 11.5–15.5)
WBC: 7.4 10*3/uL (ref 4.0–10.5)

## 2015-11-25 LAB — TSH: TSH: 1.59 u[IU]/mL (ref 0.35–4.50)

## 2015-11-25 LAB — URINALYSIS, ROUTINE W REFLEX MICROSCOPIC
Bilirubin Urine: NEGATIVE
Hgb urine dipstick: NEGATIVE
Ketones, ur: NEGATIVE
Leukocytes, UA: NEGATIVE
Nitrite: NEGATIVE
PH: 7.5 (ref 5.0–8.0)
RBC / HPF: NONE SEEN (ref 0–?)
TOTAL PROTEIN, URINE-UPE24: NEGATIVE
Urine Glucose: NEGATIVE
Urobilinogen, UA: 0.2 (ref 0.0–1.0)
WBC, UA: NONE SEEN (ref 0–?)

## 2015-11-25 LAB — LIPID PANEL
CHOLESTEROL: 148 mg/dL (ref 0–200)
HDL: 43.5 mg/dL (ref >39.00)
LDL CALC: 87 mg/dL (ref 0–99)
NonHDL: 104.36
Total CHOL/HDL Ratio: 3
Triglycerides: 89 mg/dL (ref 0.0–149.0)
VLDL: 17.8 mg/dL (ref 0.0–40.0)

## 2015-11-25 LAB — COMPREHENSIVE METABOLIC PANEL
ALBUMIN: 4.5 g/dL (ref 3.5–5.2)
ALT: 27 U/L (ref 0–53)
AST: 21 U/L (ref 0–37)
Alkaline Phosphatase: 75 U/L (ref 39–117)
BUN: 18 mg/dL (ref 6–23)
CO2: 31 mEq/L (ref 19–32)
CREATININE: 1.3 mg/dL (ref 0.40–1.50)
Calcium: 10 mg/dL (ref 8.4–10.5)
Chloride: 104 mEq/L (ref 96–112)
GFR: 58.41 mL/min — ABNORMAL LOW (ref >60.00)
GLUCOSE: 91 mg/dL (ref 70–99)
Potassium: 4.9 mEq/L (ref 3.5–5.1)
SODIUM: 143 meq/L (ref 135–145)
TOTAL PROTEIN: 7.5 g/dL (ref 6.0–8.3)
Total Bilirubin: 1.8 mg/dL — ABNORMAL HIGH (ref 0.2–1.2)

## 2015-11-25 LAB — PSA: PSA: 3.31 ng/mL (ref 0.10–4.00)

## 2015-11-25 MED ORDER — VARDENAFIL HCL 20 MG PO TABS: 20.0000 mg | ORAL_TABLET | Freq: Every day | ORAL | 11 refills | Status: DC | PRN

## 2015-11-25 MED ORDER — TELMISARTAN-HCTZ 80-12.5 MG PO TABS: 1.0000 | ORAL_TABLET | Freq: Every day | ORAL | 1 refills | Status: DC

## 2015-11-25 MED ORDER — ALBUTEROL SULFATE HFA 108 (90 BASE) MCG/ACT IN AERS: 2.0000 | INHALATION_SPRAY | Freq: Four times a day (QID) | RESPIRATORY_TRACT | 11 refills | Status: DC | PRN

## 2015-11-25 MED ORDER — BUDESONIDE-FORMOTEROL FUMARATE 80-4.5 MCG/ACT IN AERO: 2.0000 | INHALATION_SPRAY | Freq: Two times a day (BID) | RESPIRATORY_TRACT | 11 refills | Status: DC

## 2015-11-25 NOTE — Progress Notes (Signed)
Pre visit review using our clinic review tool, if applicable. No additional management support is needed unless otherwise documented below in the visit note. 

## 2015-11-25 NOTE — Patient Instructions (Signed)

## 2015-11-25 NOTE — Progress Notes (Signed)
Subjective:  Patient ID: Brett Robles, male    DOB: Mar 09, 1950  Age: 65 y.o. MRN: [id number]  CC: Annual Exam; Asthma; and Hypertension   HPI Brett Robles presents for a CPX/AWV.  He complains that his blood pressure has not been well controlled at home recently. He recently had a mild headache and checked his blood pressure and it was 150/110. He denies blurred vision, DOE, chest pain, shortness of breath, palpitations, fatigue, or edema.  He also complains of easy bruising on his hands and forearms over the exposed surfaces. He does not have any bleeding or bruising elsewhere.  His breathing is been well controlled on the LABA/ICS combination. He has had no recent episodes of cough or wheezing.  Outpatient Medications Prior to Visit  Medication Sig Dispense Refill  . dorzolamide-timolol (COSOPT) 22.3-6.8 MG/ML ophthalmic solution Place 1 drop into both eyes 2 (two) times daily.    . pantoprazole (PROTONIX) 40 MG tablet Take 1 tablet (40 mg total) by mouth daily. Keep Oct appt for future refills 30 tablet 0  . albuterol (PROVENTIL HFA;VENTOLIN HFA) 108 (90 BASE) MCG/ACT inhaler Inhale 2 puffs into the lungs every 6 (six) hours as needed for wheezing. For wheezing/shortness of breath    . ferrous sulfate 325 (65 FE) MG tablet Take 1 tablet (325 mg total) by mouth daily with breakfast. 30 tablet 5  . losartan (COZAAR) 100 MG tablet Take 1 tablet (100 mg total) by mouth daily. Keep Oct appt for future refills 30 tablet 0  . Multiple Vitamin (MULTIVITAMIN WITH MINERALS) TABS Take 1 tablet by mouth daily.    . tadalafil (CIALIS) 20 MG tablet Take 1 tablet (20 mg total) by mouth daily as needed for erectile dysfunction. 10 tablet 11  . latanoprost (XALATAN) 0.005 % ophthalmic solution Place 1 drop into the right eye daily.     No facility-administered medications prior to visit.     ROS Review of Systems  Constitutional: Negative.  Negative for appetite change, chills, diaphoresis,  fatigue and fever.  HENT: Negative for sinus pressure and trouble swallowing.   Eyes: Negative.  Negative for photophobia and visual disturbance.  Respiratory: Negative.  Negative for cough, choking, chest tightness, shortness of breath and stridor.   Cardiovascular: Negative.  Negative for chest pain, palpitations and leg swelling.  Gastrointestinal: Negative for abdominal pain, blood in stool, diarrhea, nausea and vomiting.  Endocrine: Negative.  Negative for cold intolerance and heat intolerance.  Genitourinary: Negative.  Negative for difficulty urinating, dysuria, penile pain, penile swelling, scrotal swelling, testicular pain and urgency.       He complains of ED  Musculoskeletal: Negative.  Negative for back pain, myalgias and neck pain.  Skin: Negative.  Negative for color change and rash.  Allergic/Immunologic: Negative.   Neurological: Positive for headaches. Negative for dizziness, tremors, seizures, syncope, facial asymmetry, speech difficulty, weakness, light-headedness and numbness.  Hematological: Negative for adenopathy. Bruises/bleeds easily.  Psychiatric/Behavioral: Negative.     Objective:  BP (!) 170/100 (BP Location: Left Arm, Patient Position: Sitting, Cuff Size: Normal)   Pulse 66   Temp 97.9 F (36.6 C) (Oral)   Resp 16   Ht 5' 8"  (1.727 m)   Wt 172 lb 4 oz (78.1 kg)   SpO2 98%   BMI 26.19 kg/m   BP Readings from Last 3 Encounters:  11/25/15 (!) 170/100  11/19/14 122/68  11/20/13 (!) 150/96    Wt Readings from Last 3 Encounters:  11/25/15 172 lb 4 oz (  78.1 kg)  11/19/14 170 lb (77.1 kg)  11/20/13 173 lb (78.5 kg)    Physical Exam  Constitutional: He is oriented to person, place, and time. He appears well-developed and well-nourished. No distress.  HENT:  Head: Normocephalic and atraumatic.  Mouth/Throat: Oropharynx is clear and moist. No oropharyngeal exudate.  Eyes: Conjunctivae are normal. Right eye exhibits no discharge. Left eye exhibits no  discharge. No scleral icterus.  Neck: Normal range of motion. Neck supple. No JVD present. No tracheal deviation present. No thyromegaly present.  Cardiovascular: Normal rate, regular rhythm, S1 normal, S2 normal, normal heart sounds and intact distal pulses.  Exam reveals no gallop and no friction rub.   No murmur heard. Pulses:      Carotid pulses are 1+ on the right side, and 1+ on the left side.      Radial pulses are 1+ on the right side, and 1+ on the left side.       Femoral pulses are 1+ on the right side, and 1+ on the left side.      Popliteal pulses are 1+ on the right side, and 1+ on the left side.       Dorsalis pedis pulses are 1+ on the right side, and 1+ on the left side.       Posterior tibial pulses are 1+ on the right side, and 1+ on the left side.  EKG ----  Sinus  Rhythm  WITHIN NORMAL LIMITS   Pulmonary/Chest: Effort normal and breath sounds normal. No stridor. No respiratory distress. He has no wheezes. He has no rales. He exhibits no tenderness.  Abdominal: Soft. Bowel sounds are normal. He exhibits no distension and no mass. There is no tenderness. There is no rebound and no guarding. Hernia confirmed negative in the right inguinal area and confirmed negative in the left inguinal area.  Genitourinary: Testes normal and penis normal. Rectal exam shows no external hemorrhoid, no internal hemorrhoid, no fissure, no mass, no tenderness, anal tone normal and guaiac negative stool. Prostate is enlarged (1+ smooth symm BPH). Prostate is not tender. Right testis shows no mass, no swelling and no tenderness. Right testis is descended. Left testis shows no mass, no swelling and no tenderness. Left testis is descended. No penile erythema or penile tenderness. No discharge found.  Musculoskeletal: Normal range of motion. He exhibits no edema, tenderness or deformity.  Lymphadenopathy:    He has no cervical adenopathy.       Right: No inguinal adenopathy present.       Left: No  inguinal adenopathy present.  Neurological: He is oriented to person, place, and time.  Skin: Skin is warm and dry. Ecchymosis noted. No rash noted. He is not diaphoretic. No erythema. No pallor.  Very small ecchymoses are noted on the dorsum of both forearms and wrists in the setting of sun exposure and aged skin  Psychiatric: He has a normal mood and affect. His behavior is normal. Judgment and thought content normal.  Vitals reviewed.   Lab Results  Component Value Date   WBC 7.4 11/25/2015   HGB 16.6 11/25/2015   HCT 48.8 11/25/2015   PLT 243.0 11/25/2015   GLUCOSE 91 11/25/2015   CHOL 148 11/25/2015   TRIG 89.0 11/25/2015   HDL 43.50 11/25/2015   LDLCALC 87 11/25/2015   ALT 27 11/25/2015   AST 21 11/25/2015   NA 143 11/25/2015   K 4.9 11/25/2015   CL 104 11/25/2015   CREATININE 1.30 11/25/2015  BUN 18 11/25/2015   CO2 31 11/25/2015   TSH 1.59 11/25/2015   PSA 3.31 11/25/2015   INR 1.06 06/25/2012   HGBA1C 5.8 11/19/2014    Dg Chest 2 View  Result Date: 11/24/2013 CLINICAL DATA:  Pulmonary nodules, most likely nipple shadows on prior chest x-ray. EXAM: CHEST  2 VIEW COMPARISON:  11/20/2013. FINDINGS: The heart size and mediastinal contours are within normal limits. Both lungs are clear. Previously identified nodular densities in lung bases represent nipple shadows as evidenced by nipple markers. The visualized skeletal structures are unremarkable. IMPRESSION: No active cardiopulmonary disease. Previously identified nodular densities in lung bases represent nipple shadows. Electronically Signed   By: Marcello Moores  Register   On: 11/24/2013 16:23    Assessment & Plan:   Brett Robles was seen today for annual exam, asthma and hypertension.  Diagnoses and all orders for this visit:  Senile purpura ([place])- his CBC is negative for any concerns about abnormal platelets and he has no other signs of coagulopathy, I advised him to wear protective garments over his arms and hands -     CBC  with Differential/Platelet; Future -     HIV antibody; Future  Essential hypertension, benign- his blood pressure is not well controlled so I've asked him to change to a more potent ARB and to add on a thiazide diuretic. His EKG is negative for left ventricular hypertrophy and his labs show no evidence of secondary causes or end organ damage. -     Comprehensive metabolic panel; Future -     telmisartan-hydrochlorothiazide (MICARDIS HCT) 80-12.5 MG tablet; Take 1 tablet by mouth daily. -     EKG 12-Lead -     Urinalysis, Routine w reflex microscopic (not at [facility]); Future  Benign prostatic hyperplasia without lower urinary tract symptoms- his exam and PSA are not concerning for prostate cancer, he has no symptoms that need to be treated at this time. -     PSA; Future  Hyperlipidemia with target LDL less than 130- his Framingham risk score is 16% but he is not willing to start a statin at this time, I will re-address this with him in follow-up visits. -     Lipid panel; Future -     TSH; Future  Routine general medical examination at a health care facility  Mild intermittent asthma without complication -     albuterol (PROVENTIL HFA;VENTOLIN HFA) 108 (90 Base) MCG/ACT inhaler; Inhale 2 puffs into the lungs every 6 (six) hours as needed for wheezing. For wheezing/shortness of breath -     budesonide-formoterol (SYMBICORT) 80-4.5 MCG/ACT inhaler; Inhale 2 puffs into the lungs 2 (two) times daily.  Drug-induced erectile dysfunction -     vardenafil (LEVITRA) 20 MG tablet; Take 1 tablet (20 mg total) by mouth daily as needed for erectile dysfunction.  Need for prophylactic vaccination and inoculation against influenza -     Flu vaccine HIGH DOSE PF (Fluzone High dose)   I have discontinued Brett Robles multivitamin with minerals, latanoprost, ferrous sulfate, tadalafil, and losartan. I am also having him start on budesonide-formoterol, telmisartan-hydrochlorothiazide, and vardenafil.  Additionally, I am having him maintain his dorzolamide-timolol, pantoprazole, prednisoLONE acetate, and albuterol.  Meds ordered this encounter  Medications  . prednisoLONE acetate (PRED FORTE) 1 % ophthalmic suspension    Sig: 1 drop 4 (four) times daily.  Marland Kitchen albuterol (PROVENTIL HFA;VENTOLIN HFA) 108 (90 Base) MCG/ACT inhaler    Sig: Inhale 2 puffs into the lungs every 6 (six) hours as needed  for wheezing. For wheezing/shortness of breath    Dispense:  18 g    Refill:  11  . budesonide-formoterol (SYMBICORT) 80-4.5 MCG/ACT inhaler    Sig: Inhale 2 puffs into the lungs 2 (two) times daily.    Dispense:  1 Inhaler    Refill:  11  . telmisartan-hydrochlorothiazide (MICARDIS HCT) 80-12.5 MG tablet    Sig: Take 1 tablet by mouth daily.    Dispense:  90 tablet    Refill:  1  . vardenafil (LEVITRA) 20 MG tablet    Sig: Take 1 tablet (20 mg total) by mouth daily as needed for erectile dysfunction.    Dispense:  10 tablet    Refill:  11   See AVS for instructions about healthy living and anticipatory guidance.  Follow-up: Return in about 6 weeks (around 01/06/2016).  Scarlette Calico, MD

## 2015-11-26 ENCOUNTER — Encounter: Payer: Self-pay | Admitting: Internal Medicine

## 2015-11-26 LAB — HIV ANTIBODY (ROUTINE TESTING W REFLEX): HIV 1&2 Ab, 4th Generation: NONREACTIVE

## 2015-12-12 ENCOUNTER — Other Ambulatory Visit: Payer: Self-pay | Admitting: *Deleted

## 2015-12-12 DIAGNOSIS — K269 Duodenal ulcer, unspecified as acute or chronic, without hemorrhage or perforation: Secondary | ICD-10-CM

## 2015-12-12 DIAGNOSIS — T39395A Adverse effect of other nonsteroidal anti-inflammatory drugs [NSAID], initial encounter: Principal | ICD-10-CM

## 2015-12-12 MED ORDER — PANTOPRAZOLE SODIUM 40 MG PO TBEC: 40.0000 mg | DELAYED_RELEASE_TABLET | Freq: Every day | ORAL | 1 refills | Status: DC

## 2016-06-23 ENCOUNTER — Other Ambulatory Visit: Payer: Self-pay | Admitting: Internal Medicine

## 2016-06-23 DIAGNOSIS — K269 Duodenal ulcer, unspecified as acute or chronic, without hemorrhage or perforation: Secondary | ICD-10-CM

## 2016-06-23 DIAGNOSIS — T39395A Adverse effect of other nonsteroidal anti-inflammatory drugs [NSAID], initial encounter: Principal | ICD-10-CM

## 2016-10-30 ENCOUNTER — Telehealth: Payer: Self-pay | Admitting: Internal Medicine

## 2016-10-30 DIAGNOSIS — I10 Essential (primary) hypertension: Secondary | ICD-10-CM

## 2016-10-30 MED ORDER — TELMISARTAN-HCTZ 80-12.5 MG PO TABS: 1.0000 | ORAL_TABLET | Freq: Every day | ORAL | 0 refills | Status: DC

## 2016-10-30 NOTE — Telephone Encounter (Signed)
Pt is due for annual appt sent 30 day supply until appt...Brett Robles

## 2016-10-30 NOTE — Telephone Encounter (Signed)
Pt came by the office requesting a refill on telmisartan-hydrochlorothiazide (MICARDIS HCT) 80-12.5 MG tablet to be sent to [facility] on [address] in [place]. (this is a new pharmacy)

## 2016-12-01 ENCOUNTER — Encounter: Payer: Self-pay | Admitting: Internal Medicine

## 2016-12-01 ENCOUNTER — Other Ambulatory Visit (INDEPENDENT_AMBULATORY_CARE_PROVIDER_SITE_OTHER): Payer: Managed Care, Other (non HMO)

## 2016-12-01 ENCOUNTER — Ambulatory Visit (INDEPENDENT_AMBULATORY_CARE_PROVIDER_SITE_OTHER): Payer: Managed Care, Other (non HMO) | Admitting: Internal Medicine

## 2016-12-01 VITALS — BP 124/78 | HR 60 | Temp 98.4°F | Resp 16 | Ht 68.0 in | Wt 174.0 lb

## 2016-12-01 DIAGNOSIS — Z23 Encounter for immunization: Secondary | ICD-10-CM | POA: Diagnosis not present

## 2016-12-01 DIAGNOSIS — T39395A Adverse effect of other nonsteroidal anti-inflammatory drugs [NSAID], initial encounter: Secondary | ICD-10-CM

## 2016-12-01 DIAGNOSIS — E785 Hyperlipidemia, unspecified: Secondary | ICD-10-CM

## 2016-12-01 DIAGNOSIS — K269 Duodenal ulcer, unspecified as acute or chronic, without hemorrhage or perforation: Secondary | ICD-10-CM | POA: Diagnosis not present

## 2016-12-01 DIAGNOSIS — M150 Primary generalized (osteo)arthritis: Secondary | ICD-10-CM | POA: Diagnosis not present

## 2016-12-01 DIAGNOSIS — I10 Essential (primary) hypertension: Secondary | ICD-10-CM | POA: Diagnosis not present

## 2016-12-01 DIAGNOSIS — N400 Enlarged prostate without lower urinary tract symptoms: Secondary | ICD-10-CM

## 2016-12-01 DIAGNOSIS — Z0000 Encounter for general adult medical examination without abnormal findings: Secondary | ICD-10-CM | POA: Diagnosis not present

## 2016-12-01 DIAGNOSIS — M159 Polyosteoarthritis, unspecified: Secondary | ICD-10-CM

## 2016-12-01 DIAGNOSIS — N5201 Erectile dysfunction due to arterial insufficiency: Secondary | ICD-10-CM

## 2016-12-01 DIAGNOSIS — R739 Hyperglycemia, unspecified: Secondary | ICD-10-CM | POA: Diagnosis not present

## 2016-12-01 LAB — URINALYSIS, ROUTINE W REFLEX MICROSCOPIC
Bilirubin Urine: NEGATIVE
HGB URINE DIPSTICK: NEGATIVE
Ketones, ur: NEGATIVE
Leukocytes, UA: NEGATIVE
Nitrite: NEGATIVE
PH: 6.5 (ref 5.0–8.0)
RBC / HPF: NONE SEEN (ref 0–?)
Specific Gravity, Urine: 1.015 (ref 1.000–1.030)
TOTAL PROTEIN, URINE-UPE24: NEGATIVE
Urine Glucose: NEGATIVE
Urobilinogen, UA: 0.2 (ref 0.0–1.0)
WBC UA: NONE SEEN (ref 0–?)

## 2016-12-01 LAB — LIPID PANEL
CHOL/HDL RATIO: 4
Cholesterol: 157 mg/dL (ref 0–200)
HDL: 36.1 mg/dL — AB (ref >39.00)
LDL CALC: 92 mg/dL (ref 0–99)
NonHDL: 120.5
TRIGLYCERIDES: 145 mg/dL (ref 0.0–149.0)
VLDL: 29 mg/dL (ref 0.0–40.0)

## 2016-12-01 LAB — CBC WITH DIFFERENTIAL/PLATELET
BASOS PCT: 0.6 % (ref 0.0–3.0)
Basophils Absolute: 0.1 10*3/uL (ref 0.0–0.1)
EOS ABS: 0.2 10*3/uL (ref 0.0–0.7)
EOS PCT: 2 % (ref 0.0–5.0)
HEMATOCRIT: 45.9 % (ref 39.0–52.0)
HEMOGLOBIN: 15.7 g/dL (ref 13.0–17.0)
LYMPHS PCT: 34.7 % (ref 12.0–46.0)
Lymphs Abs: 3.2 10*3/uL (ref 0.7–4.0)
MCHC: 34.1 g/dL (ref 30.0–36.0)
MCV: 93.3 fl (ref 78.0–100.0)
MONO ABS: 0.9 10*3/uL (ref 0.1–1.0)
Monocytes Relative: 10 % (ref 3.0–12.0)
Neutro Abs: 4.8 10*3/uL (ref 1.4–7.7)
Neutrophils Relative %: 52.7 % (ref 43.0–77.0)
Platelets: 227 10*3/uL (ref 150.0–400.0)
RBC: 4.92 Mil/uL (ref 4.22–5.81)
RDW: 13 % (ref 11.5–15.5)
WBC: 9.1 10*3/uL (ref 4.0–10.5)

## 2016-12-01 LAB — COMPREHENSIVE METABOLIC PANEL
ALT: 28 U/L (ref 0–53)
AST: 21 U/L (ref 0–37)
Albumin: 4.3 g/dL (ref 3.5–5.2)
Alkaline Phosphatase: 58 U/L (ref 39–117)
BUN: 17 mg/dL (ref 6–23)
CALCIUM: 9.6 mg/dL (ref 8.4–10.5)
CO2: 31 meq/L (ref 19–32)
Chloride: 102 mEq/L (ref 96–112)
Creatinine, Ser: 1.21 mg/dL (ref 0.40–1.50)
GFR: 63.26 mL/min (ref >60.00)
Glucose, Bld: 92 mg/dL (ref 70–99)
POTASSIUM: 4.2 meq/L (ref 3.5–5.1)
SODIUM: 139 meq/L (ref 135–145)
TOTAL PROTEIN: 7.2 g/dL (ref 6.0–8.3)
Total Bilirubin: 1.3 mg/dL — ABNORMAL HIGH (ref 0.2–1.2)

## 2016-12-01 LAB — TSH: TSH: 2.6 u[IU]/mL (ref 0.35–4.50)

## 2016-12-01 LAB — PSA: PSA: 3.11 ng/mL (ref 0.10–4.00)

## 2016-12-01 MED ORDER — ZOSTER VAC RECOMB ADJUVANTED 50 MCG/0.5ML IM SUSR: 0.5000 mL | Freq: Once | INTRAMUSCULAR | 1 refills | Status: AC

## 2016-12-01 MED ORDER — CELECOXIB 200 MG PO CAPS: 200.0000 mg | ORAL_CAPSULE | Freq: Every day | ORAL | 1 refills | Status: DC

## 2016-12-01 MED ORDER — PANTOPRAZOLE SODIUM 40 MG PO TBEC: 40.0000 mg | DELAYED_RELEASE_TABLET | Freq: Every day | ORAL | 1 refills | Status: DC

## 2016-12-01 MED ORDER — SILDENAFIL CITRATE 20 MG PO TABS: 80.0000 mg | ORAL_TABLET | Freq: Every day | ORAL | 11 refills | Status: DC | PRN

## 2016-12-01 MED ORDER — CELECOXIB 200 MG PO CAPS: 200.0000 mg | ORAL_CAPSULE | Freq: Two times a day (BID) | ORAL | 1 refills | Status: DC

## 2016-12-01 NOTE — Progress Notes (Signed)
Subjective:  Patient ID: Brett Robles, male    DOB: 07/14/1950  Age: 66 y.o. MRN: [id number]  CC: Annual Exam; Gastroesophageal Reflux; Hypertension; and Osteoarthritis   HPI TATEN MERROW presents for a CPX.  He complains of chronic aching in his large joints.  He gets some symptom relief with Tylenol but he wants to know if he can take something else to help and be more mobile.  He also complains of erectile dysfunction and wants to try a generic version of Viagra.  He tells me his blood pressure has been well controlled.  He is very active and has had no recent episodes of DOE, CP, shortness of breath, palpitations, edema, or fatigue.  Past Medical History:  Diagnosis Date  . Adenomatous colon polyp 04/2006  . Asthma   . Gastric ulcer   . Glaucoma   . High cholesterol   . Hypertension   . Internal hemorrhoids    Past Surgical History:  Procedure Laterality Date  . COLONOSCOPY  2008  . EYE SURGERY     @ 14 on rt eye    reports that  has never smoked. he has never used smokeless tobacco. He reports that he does not drink alcohol or use drugs. family history is not on file. He was adopted. Allergies  Allergen Reactions  . Pravachol [Pravastatin Sodium]     Muscle aches    Outpatient Medications Prior to Visit  Medication Sig Dispense Refill  . albuterol (PROVENTIL HFA;VENTOLIN HFA) 108 (90 Base) MCG/ACT inhaler Inhale 2 puffs into the lungs every 6 (six) hours as needed for wheezing. For wheezing/shortness of breath 18 g 11  . budesonide-formoterol (SYMBICORT) 80-4.5 MCG/ACT inhaler Inhale 2 puffs into the lungs 2 (two) times daily. 1 Inhaler 11  . dorzolamide-timolol (COSOPT) 22.3-6.8 MG/ML ophthalmic solution Place 1 drop into both eyes 2 (two) times daily.    Marland Kitchen gabapentin (NEURONTIN) 300 MG capsule TAKE 1 AT BEDTIME X 3 DAYS THEN INCREASE TO TWICE A DAY X 3 DAY THEN INCREASE TO 3 TIMES A DAY  0  . prednisoLONE acetate (PRED FORTE) 1 % ophthalmic suspension 1 drop 4  (four) times daily.    Marland Kitchen telmisartan-hydrochlorothiazide (MICARDIS HCT) 80-12.5 MG tablet Take 1 tablet by mouth daily. Annual appt is due must see provider for future refills 30 tablet 0  . pantoprazole (PROTONIX) 40 MG tablet TAKE ONE TABLET BY MOUTH ONCE DAILY 90 tablet 1  . vardenafil (LEVITRA) 20 MG tablet Take 1 tablet (20 mg total) by mouth daily as needed for erectile dysfunction. 10 tablet 11   No facility-administered medications prior to visit.     ROS Review of Systems  Constitutional: Negative.  Negative for appetite change, diaphoresis, fatigue and unexpected weight change.  HENT: Negative.  Negative for trouble swallowing.   Eyes: Negative for visual disturbance.  Respiratory: Negative.  Negative for cough, chest tightness, shortness of breath and stridor.   Cardiovascular: Negative.  Negative for chest pain, palpitations and leg swelling.  Gastrointestinal: Negative for abdominal pain, constipation, diarrhea, nausea and vomiting.  Endocrine: Negative.   Genitourinary: Negative.  Negative for difficulty urinating, dysuria, hematuria, penile swelling, scrotal swelling, testicular pain and urgency.  Musculoskeletal: Positive for arthralgias. Negative for back pain, joint swelling, myalgias and neck pain.  Skin: Negative.  Negative for color change.  Allergic/Immunologic: Negative.   Neurological: Negative.  Negative for dizziness, weakness and light-headedness.  Hematological: Negative for adenopathy. Does not bruise/bleed easily.  Psychiatric/Behavioral: Negative.  Objective:  BP 124/78 (BP Location: Left Arm, Patient Position: Sitting, Cuff Size: Normal)   Pulse 60   Temp 98.4 F (36.9 C) (Oral)   Resp 16   Ht 5' 8"  (1.727 m)   Wt 174 lb (78.9 kg)   SpO2 98%   BMI 26.46 kg/m   BP Readings from Last 3 Encounters:  12/01/16 124/78  11/25/15 (!) 170/100  11/19/14 122/68    Wt Readings from Last 3 Encounters:  12/01/16 174 lb (78.9 kg)  11/25/15 172 lb 4 oz  (78.1 kg)  11/19/14 170 lb (77.1 kg)    Physical Exam  Constitutional: He is oriented to person, place, and time. No distress.  HENT:  Mouth/Throat: Oropharynx is clear and moist. No oropharyngeal exudate.  Eyes: Conjunctivae are normal. Right eye exhibits no discharge. Left eye exhibits no discharge. No scleral icterus.  Neck: Normal range of motion. Neck supple. No JVD present. No thyromegaly present.  Cardiovascular: Normal rate, regular rhythm and intact distal pulses. Exam reveals no gallop and no friction rub.  No murmur heard. Pulmonary/Chest: Effort normal and breath sounds normal. No respiratory distress. He has no wheezes. He has no rales. He exhibits no tenderness.  Abdominal: Soft. Bowel sounds are normal. He exhibits no distension and no mass. There is no tenderness. There is no rebound and no guarding. Hernia confirmed negative in the right inguinal area and confirmed negative in the left inguinal area.  Genitourinary: Testes normal and penis normal. Rectal exam shows no external hemorrhoid, no internal hemorrhoid, no fissure, no mass, no tenderness, anal tone normal and guaiac negative stool. Prostate is enlarged (1+ smooth symm BPH). Prostate is not tender. Right testis shows no mass, no swelling and no tenderness. Right testis is descended. Left testis shows no mass, no swelling and no tenderness. Left testis is descended. Circumcised. No penile erythema or penile tenderness. No discharge found.  Musculoskeletal: Normal range of motion. He exhibits no edema, tenderness or deformity.  Lymphadenopathy:    He has no cervical adenopathy.       Right: No inguinal adenopathy present.       Left: No inguinal adenopathy present.  Neurological: He is alert and oriented to person, place, and time.  Skin: Skin is warm and dry. No rash noted. He is not diaphoretic. No erythema. No pallor.  Psychiatric: He has a normal mood and affect. His behavior is normal. Judgment and thought content  normal.  Vitals reviewed.   Lab Results  Component Value Date   WBC 9.1 12/01/2016   HGB 15.7 12/01/2016   HCT 45.9 12/01/2016   PLT 227.0 12/01/2016   GLUCOSE 92 12/01/2016   CHOL 157 12/01/2016   TRIG 145.0 12/01/2016   HDL 36.10 (L) 12/01/2016   LDLCALC 92 12/01/2016   ALT 28 12/01/2016   AST 21 12/01/2016   NA 139 12/01/2016   K 4.2 12/01/2016   CL 102 12/01/2016   CREATININE 1.21 12/01/2016   BUN 17 12/01/2016   CO2 31 12/01/2016   TSH 2.60 12/01/2016   PSA 3.11 12/01/2016   INR 1.06 06/25/2012   HGBA1C 5.8 11/19/2014    Dg Chest 2 View  Result Date: 11/24/2013 CLINICAL DATA:  Pulmonary nodules, most likely nipple shadows on prior chest x-ray. EXAM: CHEST  2 VIEW COMPARISON:  11/20/2013. FINDINGS: The heart size and mediastinal contours are within normal limits. Both lungs are clear. Previously identified nodular densities in lung bases represent nipple shadows as evidenced by nipple markers. The visualized  skeletal structures are unremarkable. IMPRESSION: No active cardiopulmonary disease. Previously identified nodular densities in lung bases represent nipple shadows. Electronically Signed   By: Marcello Moores  Register   On: 11/24/2013 16:23    Assessment & Plan:   Leevi was seen today for annual exam, gastroesophageal reflux, hypertension and osteoarthritis.  Diagnoses and all orders for this visit:  Essential hypertension, benign- His blood pressure is well controlled.  Electrolytes and renal function are normal.  Will continue the combination of an ARB and thiazide diuretic. -     Comprehensive metabolic panel; Future -     CBC with Differential/Platelet; Future -     Urinalysis, Routine w reflex microscopic; Future  Hyperlipidemia with target LDL less than 130- He has an elevated ASCVD risk score but is not willing to take a statin due to prior side effects. -     Lipid panel; Future -     TSH; Future  Benign prostatic hyperplasia without lower urinary tract  symptoms- His PSA is low so I am not concerned about prostate cancer.  He has no symptoms that need to be treated. -     PSA; Future  Hyperglycemia- His blood sugar is are normal now. -     Comprehensive metabolic panel; Future  Need for influenza vaccination -     Flu vaccine HIGH DOSE PF (Fluzone High dose)  Routine general medical examination at a health care facility -     Zoster Vaccine Adjuvanted [facility] [place]) injection; Inject 0.5 mLs once for 1 dose into the muscle.  Primary osteoarthritis involving multiple joints- Will add Celebrex to the Tylenol as needed. -     Discontinue: celecoxib (CELEBREX) 200 MG capsule; Take 1 capsule (200 mg total) 2 (two) times daily by mouth. -     celecoxib (CELEBREX) 200 MG capsule; Take 1 capsule (200 mg total) daily by mouth.  Duodenal ulcer due to nonsteroidal anti-inflammatory drug (NSAID)- There is no evidence of recurrence of this, will continue the PPI. -     pantoprazole (PROTONIX) 40 MG tablet; Take 1 tablet (40 mg total) daily by mouth.  Erectile dysfunction due to arterial insufficiency -     sildenafil (REVATIO) 20 MG tablet; Take 4 tablets (80 mg total) daily as needed by mouth.   I have discontinued Elijio L. Jindra's vardenafil. I have also changed his celecoxib and pantoprazole. Additionally, I am having him start on Zoster Vaccine Adjuvanted and sildenafil. Lastly, I am having him maintain his dorzolamide-timolol, prednisoLONE acetate, albuterol, budesonide-formoterol, telmisartan-hydrochlorothiazide, and gabapentin.  Meds ordered this encounter  Medications  . gabapentin (NEURONTIN) 300 MG capsule    Sig: TAKE 1 AT BEDTIME X 3 DAYS THEN INCREASE TO TWICE A DAY X 3 DAY THEN INCREASE TO 3 TIMES A DAY    Refill:  0  . Zoster Vaccine Adjuvanted (SHINGRIX) injection    Sig: Inject 0.5 mLs once for 1 dose into the muscle.    Dispense:  0.5 mL    Refill:  1  . DISCONTD: celecoxib (CELEBREX) 200 MG capsule    Sig: Take 1 capsule (200  mg total) 2 (two) times daily by mouth.    Dispense:  90 capsule    Refill:  1  . celecoxib (CELEBREX) 200 MG capsule    Sig: Take 1 capsule (200 mg total) daily by mouth.    Dispense:  90 capsule    Refill:  1  . pantoprazole (PROTONIX) 40 MG tablet    Sig: Take 1 tablet (40  mg total) daily by mouth.    Dispense:  90 tablet    Refill:  1  . sildenafil (REVATIO) 20 MG tablet    Sig: Take 4 tablets (80 mg total) daily as needed by mouth.    Dispense:  60 tablet    Refill:  11   See AVS for instructions about healthy living and anticipatory guidance.  Follow-up: Return in about 6 months (around 05/31/2017).  Scarlette Calico, MD

## 2016-12-01 NOTE — Patient Instructions (Signed)

## 2016-12-02 NOTE — Assessment & Plan Note (Signed)

## 2016-12-04 ENCOUNTER — Other Ambulatory Visit: Payer: Self-pay | Admitting: Internal Medicine

## 2016-12-04 DIAGNOSIS — I10 Essential (primary) hypertension: Secondary | ICD-10-CM

## 2017-05-28 ENCOUNTER — Other Ambulatory Visit: Payer: Self-pay | Admitting: Internal Medicine

## 2017-05-28 DIAGNOSIS — K269 Duodenal ulcer, unspecified as acute or chronic, without hemorrhage or perforation: Secondary | ICD-10-CM

## 2017-05-28 DIAGNOSIS — I10 Essential (primary) hypertension: Secondary | ICD-10-CM

## 2017-05-28 DIAGNOSIS — T39395A Adverse effect of other nonsteroidal anti-inflammatory drugs [NSAID], initial encounter: Principal | ICD-10-CM

## 2017-06-16 ENCOUNTER — Encounter: Payer: Self-pay | Admitting: Gastroenterology

## 2017-08-24 ENCOUNTER — Other Ambulatory Visit: Payer: Self-pay | Admitting: Internal Medicine

## 2017-08-24 DIAGNOSIS — K269 Duodenal ulcer, unspecified as acute or chronic, without hemorrhage or perforation: Secondary | ICD-10-CM

## 2017-08-24 DIAGNOSIS — T39395A Adverse effect of other nonsteroidal anti-inflammatory drugs [NSAID], initial encounter: Principal | ICD-10-CM

## 2017-08-24 NOTE — Telephone Encounter (Signed)
Pt is due for a follow up visit in May. Can you call and have him schedule?

## 2017-08-25 NOTE — Telephone Encounter (Signed)
Called patient and informed that he was to follow up in May. Patient states that he has only ever come once a year for his physicals and is doing fine. He said that he is a healthy and active individual and does not see a reason in coming in. Please advise.

## 2017-08-25 NOTE — Telephone Encounter (Signed)
Rx refill has been denied.

## 2017-08-28 ENCOUNTER — Other Ambulatory Visit: Payer: Self-pay | Admitting: Internal Medicine

## 2017-08-28 DIAGNOSIS — T39395A Adverse effect of other nonsteroidal anti-inflammatory drugs [NSAID], initial encounter: Principal | ICD-10-CM

## 2017-08-28 DIAGNOSIS — K269 Duodenal ulcer, unspecified as acute or chronic, without hemorrhage or perforation: Secondary | ICD-10-CM

## 2017-11-05 ENCOUNTER — Ambulatory Visit: Payer: Self-pay | Admitting: Sports Medicine

## 2017-11-12 ENCOUNTER — Ambulatory Visit (INDEPENDENT_AMBULATORY_CARE_PROVIDER_SITE_OTHER): Payer: Managed Care, Other (non HMO)

## 2017-11-12 ENCOUNTER — Ambulatory Visit: Payer: Managed Care, Other (non HMO) | Admitting: Sports Medicine

## 2017-11-12 ENCOUNTER — Other Ambulatory Visit: Payer: Self-pay

## 2017-11-12 ENCOUNTER — Encounter: Payer: Self-pay | Admitting: Sports Medicine

## 2017-11-12 VITALS — BP 122/78 | HR 68 | Resp 16 | Ht 69.0 in | Wt 173.0 lb

## 2017-11-12 DIAGNOSIS — M779 Enthesopathy, unspecified: Secondary | ICD-10-CM

## 2017-11-12 DIAGNOSIS — M79671 Pain in right foot: Secondary | ICD-10-CM | POA: Diagnosis not present

## 2017-11-12 DIAGNOSIS — M79672 Pain in left foot: Secondary | ICD-10-CM | POA: Diagnosis not present

## 2017-11-12 MED ORDER — METHYLPREDNISOLONE 4 MG PO TBPK: ORAL_TABLET | ORAL | 0 refills | Status: DC

## 2017-11-12 NOTE — Progress Notes (Signed)
   Subjective:    Patient ID: Brett Robles, male    DOB: 06/06/1950, 67 y.o.   MRN: [id number]  HPI    Review of Systems  Musculoskeletal: Positive for arthralgias and myalgias.  All other systems reviewed and are negative.      Objective:   Physical Exam        Assessment & Plan:

## 2017-11-12 NOTE — Progress Notes (Signed)
Dg right  
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
## 2017-11-12 NOTE — Patient Instructions (Signed)
For instructions on how to put on your Tri-Lock Ankle Brace, please visit [email or web address]

## 2017-11-23 ENCOUNTER — Other Ambulatory Visit: Payer: Self-pay | Admitting: Sports Medicine

## 2017-11-23 DIAGNOSIS — M79672 Pain in left foot: Secondary | ICD-10-CM

## 2017-11-23 DIAGNOSIS — M779 Enthesopathy, unspecified: Secondary | ICD-10-CM

## 2017-11-25 ENCOUNTER — Other Ambulatory Visit: Payer: Self-pay | Admitting: Internal Medicine

## 2017-11-25 DIAGNOSIS — I10 Essential (primary) hypertension: Secondary | ICD-10-CM

## 2017-11-26 ENCOUNTER — Other Ambulatory Visit: Payer: Self-pay | Admitting: Internal Medicine

## 2017-11-26 DIAGNOSIS — K269 Duodenal ulcer, unspecified as acute or chronic, without hemorrhage or perforation: Secondary | ICD-10-CM

## 2017-11-26 DIAGNOSIS — T39395A Adverse effect of other nonsteroidal anti-inflammatory drugs [NSAID], initial encounter: Principal | ICD-10-CM

## 2017-12-07 ENCOUNTER — Encounter: Payer: Self-pay | Admitting: Internal Medicine

## 2017-12-07 ENCOUNTER — Ambulatory Visit (INDEPENDENT_AMBULATORY_CARE_PROVIDER_SITE_OTHER): Payer: Managed Care, Other (non HMO) | Admitting: Internal Medicine

## 2017-12-07 ENCOUNTER — Other Ambulatory Visit (INDEPENDENT_AMBULATORY_CARE_PROVIDER_SITE_OTHER): Payer: Managed Care, Other (non HMO)

## 2017-12-07 VITALS — BP 150/90 | HR 68 | Temp 97.9°F | Ht 69.0 in | Wt 176.5 lb

## 2017-12-07 DIAGNOSIS — R739 Hyperglycemia, unspecified: Secondary | ICD-10-CM

## 2017-12-07 DIAGNOSIS — Z0000 Encounter for general adult medical examination without abnormal findings: Secondary | ICD-10-CM | POA: Diagnosis not present

## 2017-12-07 DIAGNOSIS — E118 Type 2 diabetes mellitus with unspecified complications: Secondary | ICD-10-CM

## 2017-12-07 DIAGNOSIS — E785 Hyperlipidemia, unspecified: Secondary | ICD-10-CM

## 2017-12-07 DIAGNOSIS — N400 Enlarged prostate without lower urinary tract symptoms: Secondary | ICD-10-CM

## 2017-12-07 DIAGNOSIS — J4520 Mild intermittent asthma, uncomplicated: Secondary | ICD-10-CM

## 2017-12-07 DIAGNOSIS — Z23 Encounter for immunization: Secondary | ICD-10-CM

## 2017-12-07 DIAGNOSIS — I10 Essential (primary) hypertension: Secondary | ICD-10-CM

## 2017-12-07 LAB — COMPREHENSIVE METABOLIC PANEL
ALBUMIN: 4.4 g/dL (ref 3.5–5.2)
ALK PHOS: 55 U/L (ref 39–117)
ALT: 35 U/L (ref 0–53)
AST: 25 U/L (ref 0–37)
BILIRUBIN TOTAL: 1.7 mg/dL — AB (ref 0.2–1.2)
BUN: 16 mg/dL (ref 6–23)
CALCIUM: 9.7 mg/dL (ref 8.4–10.5)
CO2: 29 meq/L (ref 19–32)
CREATININE: 1.27 mg/dL (ref 0.40–1.50)
Chloride: 102 mEq/L (ref 96–112)
GFR: 59.65 mL/min — AB (ref >60.00)
Glucose, Bld: 112 mg/dL — ABNORMAL HIGH (ref 70–99)
Potassium: 3.7 mEq/L (ref 3.5–5.1)
Sodium: 140 mEq/L (ref 135–145)
Total Protein: 7.1 g/dL (ref 6.0–8.3)

## 2017-12-07 LAB — URINALYSIS, ROUTINE W REFLEX MICROSCOPIC
Bilirubin Urine: NEGATIVE
HGB URINE DIPSTICK: NEGATIVE
Ketones, ur: NEGATIVE
Leukocytes, UA: NEGATIVE
Nitrite: NEGATIVE
PH: 7 (ref 5.0–8.0)
RBC / HPF: NONE SEEN (ref 0–?)
Specific Gravity, Urine: 1.015 (ref 1.000–1.030)
TOTAL PROTEIN, URINE-UPE24: NEGATIVE
Urine Glucose: NEGATIVE
Urobilinogen, UA: 0.2 (ref 0.0–1.0)

## 2017-12-07 LAB — CBC WITH DIFFERENTIAL/PLATELET
BASOS PCT: 0.9 % (ref 0.0–3.0)
Basophils Absolute: 0.1 10*3/uL (ref 0.0–0.1)
EOS PCT: 3.4 % (ref 0.0–5.0)
Eosinophils Absolute: 0.2 10*3/uL (ref 0.0–0.7)
HCT: 45.1 % (ref 39.0–52.0)
Hemoglobin: 15.7 g/dL (ref 13.0–17.0)
Lymphocytes Relative: 35.5 % (ref 12.0–46.0)
Lymphs Abs: 2.5 10*3/uL (ref 0.7–4.0)
MCHC: 34.7 g/dL (ref 30.0–36.0)
MCV: 92.9 fl (ref 78.0–100.0)
MONOS PCT: 10.1 % (ref 3.0–12.0)
Monocytes Absolute: 0.7 10*3/uL (ref 0.1–1.0)
NEUTROS ABS: 3.6 10*3/uL (ref 1.4–7.7)
Neutrophils Relative %: 50.1 % (ref 43.0–77.0)
PLATELETS: 215 10*3/uL (ref 150.0–400.0)
RBC: 4.86 Mil/uL (ref 4.22–5.81)
RDW: 13.3 % (ref 11.5–15.5)
WBC: 7.1 10*3/uL (ref 4.0–10.5)

## 2017-12-07 LAB — LIPID PANEL
CHOL/HDL RATIO: 4
CHOLESTEROL: 171 mg/dL (ref 0–200)
HDL: 44.3 mg/dL (ref >39.00)
LDL Cholesterol: 104 mg/dL — ABNORMAL HIGH (ref 0–99)
NonHDL: 126.64
TRIGLYCERIDES: 111 mg/dL (ref 0.0–149.0)
VLDL: 22.2 mg/dL (ref 0.0–40.0)

## 2017-12-07 LAB — HEMOGLOBIN A1C: Hgb A1c MFr Bld: 6.5 % (ref 4.6–6.5)

## 2017-12-07 LAB — TSH: TSH: 2.35 u[IU]/mL (ref 0.35–4.50)

## 2017-12-07 LAB — VITAMIN D 25 HYDROXY (VIT D DEFICIENCY, FRACTURES): VITD: 59.99 ng/mL (ref 30.00–100.00)

## 2017-12-07 LAB — PSA: PSA: 2.97 ng/mL (ref 0.10–4.00)

## 2017-12-07 MED ORDER — BUDESONIDE-FORMOTEROL FUMARATE 80-4.5 MCG/ACT IN AERO: 2.0000 | INHALATION_SPRAY | Freq: Two times a day (BID) | RESPIRATORY_TRACT | 11 refills | Status: DC

## 2017-12-07 MED ORDER — ASPIRIN EC 81 MG PO TBEC: 81.0000 mg | DELAYED_RELEASE_TABLET | Freq: Every day | ORAL | 1 refills | Status: AC

## 2017-12-07 MED ORDER — TELMISARTAN-HCTZ 80-12.5 MG PO TABS: 1.0000 | ORAL_TABLET | Freq: Every day | ORAL | 1 refills | Status: DC

## 2017-12-07 MED ORDER — PITAVASTATIN CALCIUM 2 MG PO TABS: 1.0000 | ORAL_TABLET | Freq: Every day | ORAL | 1 refills | Status: DC

## 2017-12-07 NOTE — Patient Instructions (Signed)

## 2017-12-07 NOTE — Progress Notes (Signed)
Subjective:  Patient ID: Brett Robles, male    DOB: 1950/10/28  Age: 67 y.o. MRN: [id number]  CC: Hypertension; Hyperlipidemia; and Annual Exam   HPI [name] presents for a CPX.  He is very active and denies any recent episodes of CP, DOE, palpitations, edema, or fatigue.  He complains of mild weight gain.  He tells me his asthma symptoms are well controlled.  He tells me his blood pressure is very well controlled at home usually around 120-130/75-80.  Outpatient Medications Prior to Visit  Medication Sig Dispense Refill  . pantoprazole (PROTONIX) 40 MG tablet TAKE 1 TABLET (40 MG TOTAL) DAILY BY MOUTH. 30 tablet 0  . budesonide-formoterol (SYMBICORT) 80-4.5 MCG/ACT inhaler Inhale 2 puffs into the lungs 2 (two) times daily. 1 Inhaler 11  . telmisartan-hydrochlorothiazide (MICARDIS HCT) 80-12.5 MG tablet TAKE 1 TABLET BY MOUTH EVERY DAY 30 tablet 0  . methylPREDNISolone (MEDROL DOSEPAK) 4 MG TBPK tablet Take as directed 21 tablet 0   No facility-administered medications prior to visit.     ROS Review of Systems  Constitutional: Positive for unexpected weight change (wt gain). Negative for appetite change, diaphoresis and fatigue.  HENT: Negative.   Eyes: Negative for visual disturbance.  Respiratory: Negative for cough, chest tightness, shortness of breath and wheezing.   Cardiovascular: Negative.  Negative for chest pain, palpitations and leg swelling.  Gastrointestinal: Negative for abdominal pain, constipation, diarrhea and nausea.  Endocrine: Negative.  Negative for polydipsia, polyphagia and polyuria.  Genitourinary: Negative.  Negative for difficulty urinating, penile swelling, scrotal swelling, testicular pain and urgency.  Musculoskeletal: Negative.  Negative for arthralgias, back pain, myalgias and neck pain.  Skin: Negative.  Negative for color change.  Neurological: Negative.  Negative for dizziness, weakness, light-headedness and headaches.  Hematological:  Negative for adenopathy. Does not bruise/bleed easily.  Psychiatric/Behavioral: Negative.     Objective:  BP (!) 150/90 (BP Location: Left Arm, Patient Position: Sitting, Cuff Size: Normal)   Pulse 68   Temp 97.9 F (36.6 C) (Oral)   Ht 5' 9"  (1.753 m)   Wt 176 lb 8 oz (80.1 kg)   SpO2 96%   BMI 26.06 kg/m   BP Readings from Last 3 Encounters:  12/07/17 (!) 150/90  11/12/17 122/78  12/01/16 124/78    Wt Readings from Last 3 Encounters:  12/07/17 176 lb 8 oz (80.1 kg)  11/12/17 173 lb (78.5 kg)  12/01/16 174 lb (78.9 kg)    Physical Exam  Constitutional: He is oriented to person, place, and time. No distress.  HENT:  Mouth/Throat: Oropharynx is clear and moist. No oropharyngeal exudate.  Eyes: Conjunctivae are normal. No scleral icterus.  Neck: Normal range of motion. Neck supple. No JVD present. No thyromegaly present.  Cardiovascular: Normal rate, regular rhythm and normal heart sounds.  No murmur heard. EKG -----  Sinus  Rhythm  WITHIN NORMAL LIMITS  Pulmonary/Chest: Effort normal and breath sounds normal. No respiratory distress. He has no wheezes. He has no rales.  Abdominal: Soft. Bowel sounds are normal. He exhibits no mass. There is no hepatosplenomegaly. There is no tenderness. Hernia confirmed negative in the right inguinal area and confirmed negative in the left inguinal area.  Genitourinary: Rectum normal, testes normal and penis normal. Rectal exam shows no external hemorrhoid, no internal hemorrhoid, no fissure, no mass, no tenderness, anal tone normal and guaiac negative stool. Prostate is enlarged (1+ smooth symm BPH). Prostate is not tender. Right testis shows no mass, no swelling and  no tenderness. Left testis shows no mass, no swelling and no tenderness. Circumcised. No penile erythema or penile tenderness. No discharge found.  Musculoskeletal: Normal range of motion. He exhibits no edema, tenderness or deformity.  Lymphadenopathy:    He has no cervical  adenopathy. No inguinal adenopathy noted on the right or left side.  Neurological: He is alert and oriented to person, place, and time.  Skin: Skin is warm and dry. No rash noted. He is not diaphoretic.  Vitals reviewed.   Lab Results  Component Value Date   WBC 7.1 12/07/2017   HGB 15.7 12/07/2017   HCT 45.1 12/07/2017   PLT 215.0 12/07/2017   GLUCOSE 112 (H) 12/07/2017   CHOL 171 12/07/2017   TRIG 111.0 12/07/2017   HDL 44.30 12/07/2017   LDLCALC 104 (H) 12/07/2017   ALT 35 12/07/2017   AST 25 12/07/2017   NA 140 12/07/2017   K 3.7 12/07/2017   CL 102 12/07/2017   CREATININE 1.27 12/07/2017   BUN 16 12/07/2017   CO2 29 12/07/2017   TSH 2.35 12/07/2017   PSA 2.97 12/07/2017   INR 1.06 06/25/2012   HGBA1C 6.5 12/07/2017    Dg Chest 2 View  Result Date: 11/24/2013 CLINICAL DATA:  Pulmonary nodules, most likely nipple shadows on prior chest x-ray. EXAM: CHEST  2 VIEW COMPARISON:  11/20/2013. FINDINGS: The heart size and mediastinal contours are within normal limits. Both lungs are clear. Previously identified nodular densities in lung bases represent nipple shadows as evidenced by nipple markers. The visualized skeletal structures are unremarkable. IMPRESSION: No active cardiopulmonary disease. Previously identified nodular densities in lung bases represent nipple shadows. Electronically Signed   By: Marcello Moores  Register   On: 11/24/2013 16:23    Assessment & Plan:   Zackerie was seen today for hypertension, hyperlipidemia and annual exam.  Diagnoses and all orders for this visit:  Essential hypertension, benign- His blood pressure is not well controlled today but I think this is a whitecoat phenomenon.  His labs are negative for secondary causes or endorgan damage and his EKG is negative for LVH.  Will continue the current combination of an ARB and thiazide diuretic and I have asked him to continue working on his lifestyle modifications. -     CBC with Differential/Platelet;  Future -     Comprehensive metabolic panel; Future -     Urinalysis, Routine w reflex microscopic; Future -     VITAMIN D 25 Hydroxy (Vit-D Deficiency, Fractures); Future -     EKG 12-Lead -     telmisartan-hydrochlorothiazide (MICARDIS HCT) 80-12.5 MG tablet; Take 1 tablet by mouth daily.  Hyperlipidemia with target LDL less than 130- He has a significantly elevated ASCVD risk score so I have asked him to start taking a statin and aspirin for CV risk reduction. -     Lipid panel; Future -     TSH; Future -     Pitavastatin Calcium (LIVALO) 2 MG TABS; Take 1 tablet (2 mg total) by mouth daily. -     aspirin EC 81 MG tablet; Take 1 tablet (81 mg total) by mouth daily.  Benign prostatic hyperplasia without lower urinary tract symptoms- His PSA is lower than it was a year ago which is reassuring that he does not have prostate cancer.  He has no symptoms that need to be treated. -     PSA; Future  Routine general medical examination at a health care facility- Exam completed, labs reviewed, vaccines reviewed and  updated, screening for colon cancer is up-to-date, patient education material was given.  Hyperglycemia- His A1c is up to 6.5%. -     Comprehensive metabolic panel; Future -     Hemoglobin A1c; Future  Need for influenza vaccination -     Flu vaccine HIGH DOSE PF (Fluzone High dose)  Type II diabetes mellitus with manifestations ([place])- His A1c is up to 6.5%.  This does not need to be treated with a medication.  He was encouraged to improve his lifestyle modifications. -     telmisartan-hydrochlorothiazide (MICARDIS HCT) 80-12.5 MG tablet; Take 1 tablet by mouth daily.  Mild intermittent asthma without complication -     budesonide-formoterol (SYMBICORT) 80-4.5 MCG/ACT inhaler; Inhale 2 puffs into the lungs 2 (two) times daily.   I have discontinued Michaeljames L. Schlachter "Don"'s methylPREDNISolone. I have also changed his telmisartan-hydrochlorothiazide. Additionally, I am having him  start on Pitavastatin Calcium and aspirin EC. Lastly, I am having him maintain his pantoprazole and budesonide-formoterol.  Meds ordered this encounter  Medications  . telmisartan-hydrochlorothiazide (MICARDIS HCT) 80-12.5 MG tablet    Sig: Take 1 tablet by mouth daily.    Dispense:  90 tablet    Refill:  1  . Pitavastatin Calcium (LIVALO) 2 MG TABS    Sig: Take 1 tablet (2 mg total) by mouth daily.    Dispense:  90 tablet    Refill:  1  . aspirin EC 81 MG tablet    Sig: Take 1 tablet (81 mg total) by mouth daily.    Dispense:  90 tablet    Refill:  1  . budesonide-formoterol (SYMBICORT) 80-4.5 MCG/ACT inhaler    Sig: Inhale 2 puffs into the lungs 2 (two) times daily.    Dispense:  1 Inhaler    Refill:  11     Follow-up: Return in about 6 months (around 06/07/2018).  Scarlette Calico, MD

## 2017-12-14 ENCOUNTER — Other Ambulatory Visit: Payer: Self-pay | Admitting: Internal Medicine

## 2017-12-14 DIAGNOSIS — E785 Hyperlipidemia, unspecified: Secondary | ICD-10-CM

## 2017-12-14 MED ORDER — ROSUVASTATIN CALCIUM 10 MG PO TABS: 10.0000 mg | ORAL_TABLET | Freq: Every day | ORAL | 1 refills | Status: DC

## 2017-12-16 ENCOUNTER — Encounter: Payer: Self-pay | Admitting: Sports Medicine

## 2017-12-16 ENCOUNTER — Ambulatory Visit: Payer: Managed Care, Other (non HMO) | Admitting: Sports Medicine

## 2017-12-16 DIAGNOSIS — M779 Enthesopathy, unspecified: Secondary | ICD-10-CM

## 2017-12-16 DIAGNOSIS — M79672 Pain in left foot: Secondary | ICD-10-CM

## 2017-12-16 DIAGNOSIS — M25373 Other instability, unspecified ankle: Secondary | ICD-10-CM | POA: Diagnosis not present

## 2017-12-16 NOTE — Progress Notes (Signed)
Subjective: Brett Robles is a 67 y.o. male patient who returns to office for follow up evaluation of Left foot pain. Patient now ranks pain 2/10 and states he is feeling 80% better states that the steroid that I gave him by mouth tremendously help and he feels like now that his foot has more support with the stabilizing brace and his good hiking shoes he has not had any difficulty on the left of his left ankle and foot feeling unstable and thinks that this has helped decrease any stress and strain over the tendons over the top of his foot.  Patient states that he has some instability on the right and is wondering if a set of braces would be better to protect both feet and ankles.  Patient denies any adverse medication reactions. Patient denies any other pedal complaints.   Patient Active Problem List   Diagnosis Date Noted  . Type II diabetes mellitus with manifestations ([place]) 12/07/2017  . Primary osteoarthritis involving multiple joints 12/01/2016  . Asthma, mild intermittent 11/25/2015  . BPH (benign prostatic hyperplasia) 11/19/2014  . Hyperlipidemia with target LDL less than 130 11/19/2014  . Need for prophylactic vaccination and inoculation against influenza 11/20/2013  . Essential hypertension, benign 10/10/2012  . Routine general medical examination at a health care facility 10/10/2012  . Hyperglycemia 10/10/2012  . Erectile dysfunction 10/10/2012  . Duodenal ulcer due to nonsteroidal anti-inflammatory drug (NSAID) 06/19/2012   Current Outpatient Medications on File Prior to Visit  Medication Sig Dispense Refill  . aspirin EC 81 MG tablet Take 1 tablet (81 mg total) by mouth daily. 90 tablet 1  . budesonide-formoterol (SYMBICORT) 80-4.5 MCG/ACT inhaler Inhale 2 puffs into the lungs 2 (two) times daily. 1 Inhaler 11  . pantoprazole (PROTONIX) 40 MG tablet TAKE 1 TABLET (40 MG TOTAL) DAILY BY MOUTH. 30 tablet 0  . rosuvastatin (CRESTOR) 10 MG tablet Take 1 tablet (10 mg total) by mouth  daily. 90 tablet 1  . telmisartan-hydrochlorothiazide (MICARDIS HCT) 80-12.5 MG tablet Take 1 tablet by mouth daily. 90 tablet 1   No current facility-administered medications on file prior to visit.    Allergies  Allergen Reactions  . Pravachol [Pravastatin Sodium]     Muscle aches    Objective:  General: Alert and oriented x3 in no acute distress  Dermatology: No open lesions bilateral lower extremities, no webspace macerations, no ecchymosis bilateral, all nails x 10 are well manicured.  Neurovascular: Intact. No lower extremity edema bilateral.   Musculoskeletal:Decreased tenderness with palpation at dorsal lateral left foot, there is subjective ankle instability right greater than left left is now currently controlled with use of Tri-Lock ankle brace.  Assessment and Plan: Problem List Items Addressed This Visit    None    Visit Diagnoses    Tendonitis    -  Primary   Left foot pain       Unstable ankle, unspecified laterality           -Complete examination performed -Discussed continued care for tendinitis and ankle instability -Continue with Tri-Lock ankle brace to left, topical pain cream and rubs, good supportive shoes, rest ice elevation and protection. -Rx Tri-Lock ankle brace for the right and advised patient that he would benefit from discussing long-term bracing with Betha; patient is agreeable to this but wants to wait until the new year -Patient to return to office when ready for bracing with Betha or sooner if condition worsens.  Landis Martins, DPM

## 2018-01-02 ENCOUNTER — Other Ambulatory Visit: Payer: Self-pay | Admitting: Internal Medicine

## 2018-01-02 DIAGNOSIS — K269 Duodenal ulcer, unspecified as acute or chronic, without hemorrhage or perforation: Secondary | ICD-10-CM

## 2018-01-02 DIAGNOSIS — T39395A Adverse effect of other nonsteroidal anti-inflammatory drugs [NSAID], initial encounter: Principal | ICD-10-CM

## 2018-05-30 ENCOUNTER — Other Ambulatory Visit: Payer: Self-pay | Admitting: Internal Medicine

## 2018-05-30 DIAGNOSIS — I10 Essential (primary) hypertension: Secondary | ICD-10-CM

## 2018-05-30 DIAGNOSIS — E118 Type 2 diabetes mellitus with unspecified complications: Secondary | ICD-10-CM

## 2018-06-01 ENCOUNTER — Other Ambulatory Visit: Payer: Self-pay | Admitting: Internal Medicine

## 2018-06-01 DIAGNOSIS — E785 Hyperlipidemia, unspecified: Secondary | ICD-10-CM

## 2018-06-28 ENCOUNTER — Other Ambulatory Visit: Payer: Self-pay | Admitting: Internal Medicine

## 2018-06-28 DIAGNOSIS — K269 Duodenal ulcer, unspecified as acute or chronic, without hemorrhage or perforation: Secondary | ICD-10-CM

## 2018-08-24 ENCOUNTER — Other Ambulatory Visit: Payer: Self-pay | Admitting: Internal Medicine

## 2018-08-24 DIAGNOSIS — I10 Essential (primary) hypertension: Secondary | ICD-10-CM

## 2018-08-24 DIAGNOSIS — E118 Type 2 diabetes mellitus with unspecified complications: Secondary | ICD-10-CM

## 2018-08-25 ENCOUNTER — Telehealth: Payer: Self-pay | Admitting: Internal Medicine

## 2018-08-25 NOTE — Telephone Encounter (Signed)
He has to be seen  [place]

## 2018-08-25 NOTE — Telephone Encounter (Signed)
Medication Refill - Medication: telmisartan-hydrochlorothiazide (MICARDIS HCT) 80-12.5 MG tablet   Preferred Pharmacy (with phone number or street name):  [id number] [place] [id number] [phone](Phone) [phone](Fax)

## 2018-08-26 ENCOUNTER — Encounter: Payer: Self-pay | Admitting: Internal Medicine

## 2018-08-29 ENCOUNTER — Other Ambulatory Visit (INDEPENDENT_AMBULATORY_CARE_PROVIDER_SITE_OTHER): Payer: Commercial Managed Care - PPO

## 2018-08-29 ENCOUNTER — Ambulatory Visit (INDEPENDENT_AMBULATORY_CARE_PROVIDER_SITE_OTHER): Payer: Commercial Managed Care - PPO | Admitting: Internal Medicine

## 2018-08-29 ENCOUNTER — Other Ambulatory Visit: Payer: Self-pay

## 2018-08-29 ENCOUNTER — Encounter: Payer: Self-pay | Admitting: Internal Medicine

## 2018-08-29 VITALS — BP 142/80 | HR 84 | Temp 98.0°F | Resp 16 | Ht 69.0 in | Wt 175.0 lb

## 2018-08-29 DIAGNOSIS — E118 Type 2 diabetes mellitus with unspecified complications: Secondary | ICD-10-CM | POA: Diagnosis not present

## 2018-08-29 DIAGNOSIS — I10 Essential (primary) hypertension: Secondary | ICD-10-CM | POA: Diagnosis not present

## 2018-08-29 DIAGNOSIS — N183 Chronic kidney disease, stage 3 unspecified: Secondary | ICD-10-CM

## 2018-08-29 LAB — BASIC METABOLIC PANEL
BUN: 21 mg/dL (ref 6–23)
CO2: 29 mEq/L (ref 19–32)
Calcium: 10 mg/dL (ref 8.4–10.5)
Chloride: 102 mEq/L (ref 96–112)
Creatinine, Ser: 1.29 mg/dL (ref 0.40–1.50)
GFR: 55 mL/min — ABNORMAL LOW (ref >60.00)
Glucose, Bld: 102 mg/dL — ABNORMAL HIGH (ref 70–99)
Potassium: 4.4 mEq/L (ref 3.5–5.1)
Sodium: 139 mEq/L (ref 135–145)

## 2018-08-29 LAB — URINALYSIS, ROUTINE W REFLEX MICROSCOPIC
Bilirubin Urine: NEGATIVE
Hgb urine dipstick: NEGATIVE
Ketones, ur: NEGATIVE
Leukocytes,Ua: NEGATIVE
Nitrite: NEGATIVE
RBC / HPF: NONE SEEN (ref 0–?)
Specific Gravity, Urine: 1.02 (ref 1.000–1.030)
Total Protein, Urine: NEGATIVE
Urine Glucose: NEGATIVE
Urobilinogen, UA: 0.2 (ref 0.0–1.0)
WBC, UA: NONE SEEN (ref 0–?)
pH: 6 (ref 5.0–8.0)

## 2018-08-29 LAB — HEMOGLOBIN A1C: Hgb A1c MFr Bld: 6.7 % — ABNORMAL HIGH (ref 4.6–6.5)

## 2018-08-29 LAB — MICROALBUMIN / CREATININE URINE RATIO
Creatinine,U: 137.6 mg/dL
Microalb Creat Ratio: 0.5 mg/g (ref 0.0–30.0)
Microalb, Ur: <0.7 mg/dL (ref 0.0–1.9)

## 2018-08-29 MED ORDER — EDARBYCLOR 40-12.5 MG PO TABS: 1.0000 | ORAL_TABLET | Freq: Every day | ORAL | 1 refills | Status: DC

## 2018-08-29 NOTE — Patient Instructions (Signed)

## 2018-08-29 NOTE — Progress Notes (Signed)
Subjective:  Patient ID: Brett Robles, male    DOB: 03-20-1950  Age: 68 y.o. MRN: [id number]  CC: Hypertension and Diabetes   HPI Brett Robles presents for f/up - He has been working on his lifestyle modifications but is concerned that his blood pressure is not adequately well controlled.  He uses a push mower and denies any recent episodes of CP, DOE, diaphoresis, edema, or fatigue.  Outpatient Medications Prior to Visit  Medication Sig Dispense Refill  . aspirin EC 81 MG tablet Take 1 tablet (81 mg total) by mouth daily. 90 tablet 1  . budesonide-formoterol (SYMBICORT) 80-4.5 MCG/ACT inhaler Inhale 2 puffs into the lungs 2 (two) times daily. 1 Inhaler 11  . pantoprazole (PROTONIX) 40 MG tablet TAKE 1 TABLET (40 MG TOTAL) DAILY BY MOUTH. 90 tablet 1  . rosuvastatin (CRESTOR) 10 MG tablet TAKE 1 TABLET BY MOUTH EVERY DAY 90 tablet 1  . telmisartan-hydrochlorothiazide (MICARDIS HCT) 80-12.5 MG tablet TAKE 1 TABLET BY MOUTH EVERY DAY 90 tablet 0   No facility-administered medications prior to visit.     ROS Review of Systems  Constitutional: Negative for diaphoresis and fatigue.  HENT: Negative.   Eyes: Negative for visual disturbance.  Respiratory: Negative for cough, chest tightness, shortness of breath and wheezing.   Cardiovascular: Negative for chest pain, palpitations and leg swelling.  Gastrointestinal: Negative for abdominal pain, constipation, diarrhea, nausea and vomiting.  Endocrine: Negative.   Genitourinary: Negative.  Negative for difficulty urinating.  Musculoskeletal: Negative.  Negative for arthralgias and myalgias.  Skin: Negative.  Negative for color change, pallor and rash.  Neurological: Negative.  Negative for dizziness, weakness, light-headedness and headaches.  Hematological: Negative for adenopathy. Does not bruise/bleed easily.  Psychiatric/Behavioral: Negative.     Objective:  BP (!) 142/80   Pulse 84   Temp 98 F (36.7 C)   Resp 16   Ht 5' 9"   (1.753 m)   Wt 175 lb (79.4 kg)   SpO2 96%   BMI 25.84 kg/m   BP Readings from Last 3 Encounters:  08/29/18 (!) 142/80  12/07/17 (!) 150/90  11/12/17 122/78    Wt Readings from Last 3 Encounters:  08/29/18 175 lb (79.4 kg)  12/07/17 176 lb 8 oz (80.1 kg)  11/12/17 173 lb (78.5 kg)    Physical Exam Vitals signs reviewed.  Constitutional:      Appearance: He is not ill-appearing or diaphoretic.  HENT:     Nose: Nose normal.     Mouth/Throat:     Mouth: Mucous membranes are moist.  Eyes:     General: No scleral icterus.    Conjunctiva/sclera: Conjunctivae normal.  Neck:     Musculoskeletal: Normal range of motion. No neck rigidity.  Cardiovascular:     Rate and Rhythm: Normal rate and regular rhythm.     Pulses: Normal pulses.     Heart sounds: No murmur. No gallop.   Pulmonary:     Effort: Pulmonary effort is normal.     Breath sounds: No stridor. No wheezing, rhonchi or rales.  Abdominal:     General: Abdomen is flat. Bowel sounds are normal. There is no distension.     Palpations: There is no hepatomegaly, splenomegaly or mass.     Tenderness: There is no abdominal tenderness. There is no guarding.  Musculoskeletal: Normal range of motion.     Right lower leg: No edema.     Left lower leg: No edema.  Lymphadenopathy:  Cervical: No cervical adenopathy.  Skin:    General: Skin is warm and dry.     Coloration: Skin is not pale.  Neurological:     Mental Status: He is oriented to person, place, and time. Mental status is at baseline.     Lab Results  Component Value Date   WBC 7.1 12/07/2017   HGB 15.7 12/07/2017   HCT 45.1 12/07/2017   PLT 215.0 12/07/2017   GLUCOSE 102 (H) 08/29/2018   CHOL 171 12/07/2017   TRIG 111.0 12/07/2017   HDL 44.30 12/07/2017   LDLCALC 104 (H) 12/07/2017   ALT 35 12/07/2017   AST 25 12/07/2017   NA 139 08/29/2018   K 4.4 08/29/2018   CL 102 08/29/2018   CREATININE 1.29 08/29/2018   BUN 21 08/29/2018   CO2 29  08/29/2018   TSH 2.35 12/07/2017   PSA 2.97 12/07/2017   INR 1.06 06/25/2012   HGBA1C 6.7 (H) 08/29/2018   MICROALBUR <0.7 08/29/2018    Dg Chest 2 View  Result Date: 11/24/2013 CLINICAL DATA:  Pulmonary nodules, most likely nipple shadows on prior chest x-ray. EXAM: CHEST  2 VIEW COMPARISON:  11/20/2013. FINDINGS: The heart size and mediastinal contours are within normal limits. Both lungs are clear. Previously identified nodular densities in lung bases represent nipple shadows as evidenced by nipple markers. The visualized skeletal structures are unremarkable. IMPRESSION: No active cardiopulmonary disease. Previously identified nodular densities in lung bases represent nipple shadows. Electronically Signed   By: Marcello Moores  Register   On: 11/24/2013 16:23    Assessment & Plan:   [name] was seen today for hypertension and diabetes.  Diagnoses and all orders for this visit:  Essential hypertension, benign- His blood pressure is not adequately well controlled.  I have asked him to upgrade to a more potent ARB and thiazide diuretic. -     Basic metabolic panel; Future -     Urinalysis, Routine w reflex microscopic; Future -     Azilsartan-Chlorthalidone (EDARBYCLOR) 40-12.5 MG TABS; Take 1 tablet by mouth daily.  Type II diabetes mellitus with manifestations ([place])- His A1c is up to 6.7%.  I have asked him to start taking metformin to improve his glycemic control. -     Basic metabolic panel; Future -     Hemoglobin A1c; Future -     Microalbumin / creatinine urine ratio; Future -     HM Diabetes Foot Exam -     Azilsartan-Chlorthalidone (EDARBYCLOR) 40-12.5 MG TABS; Take 1 tablet by mouth daily. -     metformin (FORTAMET) 1000 MG (OSM) 24 hr tablet; Take 1 tablet (1,000 mg total) by mouth daily with breakfast.  Chronic renal disease, stage 3, moderately decreased glomerular filtration rate (GFR) between 30-59 mL/min/1.73 square meter ([place])- He will avoid nephrotoxic agents.  Will try to  maintain control of his blood pressure and blood sugars.   I have discontinued Loron L. Chisholm "Don"'s telmisartan-hydrochlorothiazide. I am also having him start on Edarbyclor and metformin. Additionally, I am having him maintain his aspirin EC, budesonide-formoterol, rosuvastatin, and pantoprazole.  Meds ordered this encounter  Medications  . Azilsartan-Chlorthalidone (EDARBYCLOR) 40-12.5 MG TABS    Sig: Take 1 tablet by mouth daily.    Dispense:  90 tablet    Refill:  1  . metformin (FORTAMET) 1000 MG (OSM) 24 hr tablet    Sig: Take 1 tablet (1,000 mg total) by mouth daily with breakfast.    Dispense:  90 tablet    Refill:  1     Follow-up: Return in about 4 months (around 12/29/2018).  Scarlette Calico, MD

## 2018-08-30 ENCOUNTER — Encounter: Payer: Self-pay | Admitting: Internal Medicine

## 2018-08-30 DIAGNOSIS — N183 Chronic kidney disease, stage 3 unspecified: Secondary | ICD-10-CM | POA: Insufficient documentation

## 2018-08-30 MED ORDER — METFORMIN HCL ER (OSM) 1000 MG PO TB24: 1000.0000 mg | ORAL_TABLET | Freq: Every day | ORAL | 1 refills | Status: DC

## 2018-09-24 ENCOUNTER — Other Ambulatory Visit: Payer: Self-pay | Admitting: Internal Medicine

## 2018-09-24 DIAGNOSIS — E118 Type 2 diabetes mellitus with unspecified complications: Secondary | ICD-10-CM

## 2018-09-24 DIAGNOSIS — I10 Essential (primary) hypertension: Secondary | ICD-10-CM

## 2018-09-26 ENCOUNTER — Encounter: Payer: Self-pay | Admitting: Internal Medicine

## 2018-09-26 DIAGNOSIS — K269 Duodenal ulcer, unspecified as acute or chronic, without hemorrhage or perforation: Secondary | ICD-10-CM

## 2018-09-26 DIAGNOSIS — T39395A Adverse effect of other nonsteroidal anti-inflammatory drugs [NSAID], initial encounter: Secondary | ICD-10-CM

## 2018-09-26 MED ORDER — PANTOPRAZOLE SODIUM 40 MG PO TBEC: 40.0000 mg | DELAYED_RELEASE_TABLET | Freq: Every day | ORAL | 0 refills | Status: DC

## 2018-11-09 LAB — HM DIABETES EYE EXAM

## 2018-11-25 ENCOUNTER — Other Ambulatory Visit: Payer: Self-pay | Admitting: Internal Medicine

## 2018-11-25 DIAGNOSIS — E785 Hyperlipidemia, unspecified: Secondary | ICD-10-CM

## 2018-12-05 ENCOUNTER — Other Ambulatory Visit: Payer: Self-pay | Admitting: Internal Medicine

## 2018-12-05 DIAGNOSIS — J4520 Mild intermittent asthma, uncomplicated: Secondary | ICD-10-CM

## 2018-12-06 ENCOUNTER — Other Ambulatory Visit: Payer: Self-pay | Admitting: Internal Medicine

## 2018-12-06 DIAGNOSIS — J4520 Mild intermittent asthma, uncomplicated: Secondary | ICD-10-CM

## 2018-12-06 MED ORDER — BREO ELLIPTA 100-25 MCG/INH IN AEPB: 1.0000 | INHALATION_SPRAY | Freq: Every day | RESPIRATORY_TRACT | 1 refills | Status: DC

## 2018-12-13 ENCOUNTER — Ambulatory Visit (INDEPENDENT_AMBULATORY_CARE_PROVIDER_SITE_OTHER): Payer: Commercial Managed Care - PPO | Admitting: Internal Medicine

## 2018-12-13 ENCOUNTER — Other Ambulatory Visit: Payer: Self-pay

## 2018-12-13 ENCOUNTER — Encounter: Payer: Self-pay | Admitting: Internal Medicine

## 2018-12-13 ENCOUNTER — Other Ambulatory Visit (INDEPENDENT_AMBULATORY_CARE_PROVIDER_SITE_OTHER): Payer: Commercial Managed Care - PPO

## 2018-12-13 VITALS — BP 138/86 | HR 63 | Temp 98.2°F | Resp 16 | Ht 69.0 in | Wt 176.0 lb

## 2018-12-13 DIAGNOSIS — N400 Enlarged prostate without lower urinary tract symptoms: Secondary | ICD-10-CM

## 2018-12-13 DIAGNOSIS — N1831 Chronic kidney disease, stage 3a: Secondary | ICD-10-CM

## 2018-12-13 DIAGNOSIS — E785 Hyperlipidemia, unspecified: Secondary | ICD-10-CM

## 2018-12-13 DIAGNOSIS — Z0000 Encounter for general adult medical examination without abnormal findings: Secondary | ICD-10-CM | POA: Diagnosis not present

## 2018-12-13 DIAGNOSIS — R7989 Other specified abnormal findings of blood chemistry: Secondary | ICD-10-CM

## 2018-12-13 DIAGNOSIS — Z23 Encounter for immunization: Secondary | ICD-10-CM

## 2018-12-13 DIAGNOSIS — I10 Essential (primary) hypertension: Secondary | ICD-10-CM | POA: Diagnosis not present

## 2018-12-13 DIAGNOSIS — E118 Type 2 diabetes mellitus with unspecified complications: Secondary | ICD-10-CM | POA: Diagnosis not present

## 2018-12-13 DIAGNOSIS — N5201 Erectile dysfunction due to arterial insufficiency: Secondary | ICD-10-CM

## 2018-12-13 DIAGNOSIS — K7581 Nonalcoholic steatohepatitis (NASH): Secondary | ICD-10-CM | POA: Insufficient documentation

## 2018-12-13 LAB — CBC WITH DIFFERENTIAL/PLATELET
Basophils Absolute: 0.1 10*3/uL (ref 0.0–0.1)
Basophils Relative: 0.8 % (ref 0.0–3.0)
Eosinophils Absolute: 0.2 10*3/uL (ref 0.0–0.7)
Eosinophils Relative: 3.1 % (ref 0.0–5.0)
HCT: 42.4 % (ref 39.0–52.0)
Hemoglobin: 14.4 g/dL (ref 13.0–17.0)
Lymphocytes Relative: 33.1 % (ref 12.0–46.0)
Lymphs Abs: 2.6 10*3/uL (ref 0.7–4.0)
MCHC: 33.9 g/dL (ref 30.0–36.0)
MCV: 93.7 fl (ref 78.0–100.0)
Monocytes Absolute: 0.7 10*3/uL (ref 0.1–1.0)
Monocytes Relative: 8.9 % (ref 3.0–12.0)
Neutro Abs: 4.3 10*3/uL (ref 1.4–7.7)
Neutrophils Relative %: 54.1 % (ref 43.0–77.0)
Platelets: 211 10*3/uL (ref 150.0–400.0)
RBC: 4.52 Mil/uL (ref 4.22–5.81)
RDW: 13.4 % (ref 11.5–15.5)
WBC: 8 10*3/uL (ref 4.0–10.5)

## 2018-12-13 LAB — HEPATIC FUNCTION PANEL
ALT: 59 U/L — ABNORMAL HIGH (ref 0–53)
AST: 36 U/L (ref 0–37)
Albumin: 4.6 g/dL (ref 3.5–5.2)
Alkaline Phosphatase: 56 U/L (ref 39–117)
Bilirubin, Direct: 0.3 mg/dL (ref 0.0–0.3)
Total Bilirubin: 1.6 mg/dL — ABNORMAL HIGH (ref 0.2–1.2)
Total Protein: 7.1 g/dL (ref 6.0–8.3)

## 2018-12-13 LAB — BASIC METABOLIC PANEL
BUN: 20 mg/dL (ref 6–23)
CO2: 30 mEq/L (ref 19–32)
Calcium: 9.8 mg/dL (ref 8.4–10.5)
Chloride: 102 mEq/L (ref 96–112)
Creatinine, Ser: 1.42 mg/dL (ref 0.40–1.50)
GFR: 49.19 mL/min — ABNORMAL LOW (ref >60.00)
Glucose, Bld: 103 mg/dL — ABNORMAL HIGH (ref 70–99)
Potassium: 4.3 mEq/L (ref 3.5–5.1)
Sodium: 139 mEq/L (ref 135–145)

## 2018-12-13 LAB — LIPID PANEL
Cholesterol: 110 mg/dL (ref 0–200)
HDL: 34.9 mg/dL — ABNORMAL LOW (ref >39.00)
LDL Cholesterol: 44 mg/dL (ref 0–99)
NonHDL: 74.75
Total CHOL/HDL Ratio: 3
Triglycerides: 152 mg/dL — ABNORMAL HIGH (ref 0.0–149.0)
VLDL: 30.4 mg/dL (ref 0.0–40.0)

## 2018-12-13 LAB — TSH: TSH: 2.79 u[IU]/mL (ref 0.35–4.50)

## 2018-12-13 LAB — HEMOGLOBIN A1C: Hgb A1c MFr Bld: 6.4 % (ref 4.6–6.5)

## 2018-12-13 LAB — PSA: PSA: 2.92 ng/mL (ref 0.10–4.00)

## 2018-12-13 MED ORDER — EDARBYCLOR 40-12.5 MG PO TABS: 1.0000 | ORAL_TABLET | Freq: Every day | ORAL | 1 refills | Status: DC

## 2018-12-13 MED ORDER — SILDENAFIL CITRATE 20 MG PO TABS: 80.0000 mg | ORAL_TABLET | Freq: Every day | ORAL | 11 refills | Status: DC | PRN

## 2018-12-13 NOTE — Progress Notes (Signed)
Subjective:  Patient ID: Brett Robles, male    DOB: 1950-04-08  Age: 68 y.o. MRN: [id number]  CC: Annual Exam, Hypertension, Hyperlipidemia, and Diabetes   HPI Brett Robles presents for a CPX.  He tells me his blood pressure has been well controlled.  He is very active and denies any recent episodes of CP, DOE, palpitations, edema, or fatigue.  Outpatient Medications Prior to Visit  Medication Sig Dispense Refill  . aspirin EC 81 MG tablet Take 1 tablet (81 mg total) by mouth daily. 90 tablet 1  . fluticasone furoate-vilanterol (BREO ELLIPTA) 100-25 MCG/INH AEPB Inhale 1 puff into the lungs daily. 90 each 1  . metformin (FORTAMET) 1000 MG (OSM) 24 hr tablet Take 1 tablet (1,000 mg total) by mouth daily with breakfast. 90 tablet 1  . pantoprazole (PROTONIX) 40 MG tablet Take 1 tablet (40 mg total) by mouth daily. 90 tablet 0  . rosuvastatin (CRESTOR) 10 MG tablet TAKE 1 TABLET BY MOUTH EVERY DAY 90 tablet 1  . Azilsartan-Chlorthalidone (EDARBYCLOR) 40-12.5 MG TABS Take 1 tablet by mouth daily. 90 tablet 1   No facility-administered medications prior to visit.     ROS Review of Systems  Constitutional: Negative for diaphoresis, fatigue and unexpected weight change.  HENT: Negative.   Eyes: Negative for visual disturbance.  Respiratory: Negative for cough, chest tightness, shortness of breath and wheezing.   Cardiovascular: Negative for chest pain, palpitations and leg swelling.  Gastrointestinal: Negative for abdominal pain, constipation, diarrhea, nausea and vomiting.  Endocrine: Negative.   Genitourinary: Negative.  Negative for difficulty urinating, dysuria, scrotal swelling and testicular pain.       +ED  Musculoskeletal: Negative for arthralgias and myalgias.  Skin: Negative.  Negative for color change and pallor.  Allergic/Immunologic: Negative.   Neurological: Negative.  Negative for dizziness and light-headedness.  Hematological: Negative for adenopathy. Does not  bruise/bleed easily.  Psychiatric/Behavioral: Negative.     Objective:  BP 138/86 (BP Location: Left Arm, Patient Position: Sitting, Cuff Size: Normal)   Pulse 63   Temp 98.2 F (36.8 C) (Oral)   Resp 16   Ht 5' 9"  (1.753 m)   Wt 176 lb (79.8 kg)   SpO2 98%   BMI 25.99 kg/m   BP Readings from Last 3 Encounters:  12/13/18 138/86  08/29/18 (!) 142/80  12/07/17 (!) 150/90    Wt Readings from Last 3 Encounters:  12/13/18 176 lb (79.8 kg)  08/29/18 175 lb (79.4 kg)  12/07/17 176 lb 8 oz (80.1 kg)    Physical Exam Vitals signs reviewed.  Constitutional:      Appearance: Normal appearance.  HENT:     Nose: Nose normal.     Mouth/Throat:     Mouth: Mucous membranes are moist.  Eyes:     General: No scleral icterus.    Conjunctiva/sclera: Conjunctivae normal.  Neck:     Musculoskeletal: Neck supple.  Cardiovascular:     Rate and Rhythm: Normal rate and regular rhythm.     Heart sounds: No murmur.  Pulmonary:     Effort: Pulmonary effort is normal.     Breath sounds: No stridor. No wheezing, rhonchi or rales.  Abdominal:     General: Abdomen is flat. Bowel sounds are normal. There is no distension.     Palpations: Abdomen is soft. There is no hepatomegaly or splenomegaly.     Tenderness: There is no abdominal tenderness.     Hernia: There is no hernia in the left  inguinal area or right inguinal area.  Genitourinary:    Pubic Area: No rash.      Penis: Normal. No discharge, swelling or lesions.      Scrotum/Testes: Normal.        Right: Mass, tenderness or swelling not present.        Left: Mass, tenderness or swelling not present.     Epididymis:     Right: Normal. Not inflamed or enlarged. No mass or tenderness.     Left: Normal. Not inflamed or enlarged. No mass or tenderness.     Prostate: Enlarged. Not tender and no nodules present.     Rectum: Guaiac result negative. External hemorrhoid and internal hemorrhoid present. No mass, tenderness or anal fissure.  Normal anal tone.     Comments: There are uncomplicated internal and external anal hemorrhoids. Musculoskeletal:     Right lower leg: No edema.     Left lower leg: No edema.  Lymphadenopathy:     Cervical: No cervical adenopathy.     Lower Body: No right inguinal adenopathy. No left inguinal adenopathy.  Skin:    General: Skin is warm and dry.     Coloration: Skin is not pale.  Neurological:     General: No focal deficit present.     Mental Status: He is alert.  Psychiatric:        Mood and Affect: Mood normal.        Behavior: Behavior normal.     Lab Results  Component Value Date   WBC 8.0 12/13/2018   HGB 14.4 12/13/2018   HCT 42.4 12/13/2018   PLT 211.0 12/13/2018   GLUCOSE 103 (H) 12/13/2018   CHOL 110 12/13/2018   TRIG 152.0 (H) 12/13/2018   HDL 34.90 (L) 12/13/2018   LDLCALC 44 12/13/2018   ALT 59 (H) 12/13/2018   AST 36 12/13/2018   NA 139 12/13/2018   K 4.3 12/13/2018   CL 102 12/13/2018   CREATININE 1.42 12/13/2018   BUN 20 12/13/2018   CO2 30 12/13/2018   TSH 2.79 12/13/2018   PSA 2.92 12/13/2018   INR 1.06 06/25/2012   HGBA1C 6.4 12/13/2018   MICROALBUR <0.7 08/29/2018    Dg Chest 2 View  Result Date: 11/24/2013 CLINICAL DATA:  Pulmonary nodules, most likely nipple shadows on prior chest x-ray. EXAM: CHEST  2 VIEW COMPARISON:  11/20/2013. FINDINGS: The heart size and mediastinal contours are within normal limits. Both lungs are clear. Previously identified nodular densities in lung bases represent nipple shadows as evidenced by nipple markers. The visualized skeletal structures are unremarkable. IMPRESSION: No active cardiopulmonary disease. Previously identified nodular densities in lung bases represent nipple shadows. Electronically Signed   By: Marcello Moores  Register   On: 11/24/2013 16:23    Assessment & Plan:   Brett Robles was seen today for annual exam, hypertension, hyperlipidemia and diabetes.  Diagnoses and all orders for this visit:  Essential  hypertension, benign- His blood pressure is adequately well controlled. -     CBC with Differential; Future -     Basic metabolic panel; Future -     TSH; Future -     Azilsartan-Chlorthalidone (EDARBYCLOR) 40-12.5 MG TABS; Take 1 tablet by mouth daily.  Type II diabetes mellitus with manifestations ([place])- His A1c is at 6.4%.  His blood sugars are adequately well controlled. -     Basic metabolic panel; Future -     Hemoglobin A1c; Future -     Azilsartan-Chlorthalidone (EDARBYCLOR) 40-12.5 MG TABS; Take  1 tablet by mouth daily.  Benign prostatic hyperplasia without lower urinary tract symptoms- His PSA is normal which is reassuring that he does not have prostate cancer.  He has no symptoms that need to be treated. -     PSA; Future  Stage 3a chronic kidney disease- His renal function has improved slightly.  He will avoid nephrotoxic agents.  Will maintain tight control of blood pressure and blood sugar. -     Basic metabolic panel; Future  Hyperlipidemia with target LDL less than 130- He has achieved his LDL goal and is doing well on the statin. -     Lipid panel; Future -     Hepatic function panel; Future -     TSH; Future  Routine general medical examination at a health care facility- Exam completed, labs reviewed, vaccines reviewed and updated, cancer screenings are all up-to-date, patient education was given.  Need for influenza vaccination -     Flu Vaccine QUAD High Dose(Fluad)  Need for shingles vaccine -     Varicella-zoster vaccine IM (Shingrix)  Erectile dysfunction due to arterial insufficiency -     sildenafil (REVATIO) 20 MG tablet; Take 4 tablets (80 mg total) by mouth daily as needed.  Elevated LFTs- I recommended that he undergo an ultrasound to see if he has NASH. -     US Abdomen Limited RUQ; Future   I have changed Brett Robles "Don"'s sildenafil. I am also having him maintain his aspirin EC, metformin, pantoprazole, rosuvastatin, Breo Ellipta, and  Edarbyclor.  Meds ordered this encounter  Medications  . sildenafil (REVATIO) 20 MG tablet    Sig: Take 4 tablets (80 mg total) by mouth daily as needed.    Dispense:  60 tablet    Refill:  11  . Azilsartan-Chlorthalidone (EDARBYCLOR) 40-12.5 MG TABS    Sig: Take 1 tablet by mouth daily.    Dispense:  90 tablet    Refill:  1     Follow-up: Return in about 6 months (around 06/12/2019).  Scarlette Calico, MD

## 2018-12-13 NOTE — Patient Instructions (Signed)

## 2018-12-16 ENCOUNTER — Telehealth: Payer: Self-pay

## 2018-12-16 ENCOUNTER — Encounter: Payer: Self-pay | Admitting: Internal Medicine

## 2018-12-16 NOTE — Telephone Encounter (Signed)
Key: [id number]

## 2019-01-09 ENCOUNTER — Encounter: Payer: Self-pay | Admitting: Internal Medicine

## 2019-01-25 ENCOUNTER — Encounter: Payer: Self-pay | Admitting: Internal Medicine

## 2019-01-25 ENCOUNTER — Ambulatory Visit
Admission: RE | Admit: 2019-01-25 | Discharge: 2019-01-25 | Disposition: A | Discharge status: Home / Self Care | Payer: Commercial Managed Care - PPO | Source: Ambulatory Visit | Attending: Internal Medicine | Admitting: Internal Medicine

## 2019-01-25 DIAGNOSIS — R7989 Other specified abnormal findings of blood chemistry: Secondary | ICD-10-CM

## 2019-02-24 ENCOUNTER — Other Ambulatory Visit: Payer: Self-pay | Admitting: Internal Medicine

## 2019-02-24 DIAGNOSIS — K7581 Nonalcoholic steatohepatitis (NASH): Secondary | ICD-10-CM

## 2019-02-24 DIAGNOSIS — E118 Type 2 diabetes mellitus with unspecified complications: Secondary | ICD-10-CM

## 2019-02-24 MED ORDER — PIOGLITAZONE HCL 15 MG PO TABS: 15.0000 mg | ORAL_TABLET | Freq: Every day | ORAL | 1 refills | Status: DC

## 2019-02-26 ENCOUNTER — Other Ambulatory Visit: Payer: Self-pay | Admitting: Internal Medicine

## 2019-02-26 DIAGNOSIS — K269 Duodenal ulcer, unspecified as acute or chronic, without hemorrhage or perforation: Secondary | ICD-10-CM

## 2019-02-26 DIAGNOSIS — T39395A Adverse effect of other nonsteroidal anti-inflammatory drugs [NSAID], initial encounter: Secondary | ICD-10-CM

## 2019-03-01 ENCOUNTER — Other Ambulatory Visit: Payer: Self-pay | Admitting: Internal Medicine

## 2019-03-01 DIAGNOSIS — E118 Type 2 diabetes mellitus with unspecified complications: Secondary | ICD-10-CM

## 2019-04-10 ENCOUNTER — Ambulatory Visit (INDEPENDENT_AMBULATORY_CARE_PROVIDER_SITE_OTHER): Payer: Commercial Managed Care - PPO | Admitting: *Deleted

## 2019-04-10 ENCOUNTER — Other Ambulatory Visit: Payer: Self-pay

## 2019-04-10 DIAGNOSIS — Z23 Encounter for immunization: Secondary | ICD-10-CM

## 2019-04-17 ENCOUNTER — Other Ambulatory Visit: Payer: Self-pay | Admitting: Internal Medicine

## 2019-04-17 DIAGNOSIS — J4520 Mild intermittent asthma, uncomplicated: Secondary | ICD-10-CM

## 2019-04-25 ENCOUNTER — Encounter: Payer: Self-pay | Admitting: Internal Medicine

## 2019-05-19 ENCOUNTER — Other Ambulatory Visit: Payer: Self-pay | Admitting: Internal Medicine

## 2019-05-19 DIAGNOSIS — E785 Hyperlipidemia, unspecified: Secondary | ICD-10-CM

## 2019-05-26 ENCOUNTER — Other Ambulatory Visit: Payer: Self-pay | Admitting: Internal Medicine

## 2019-05-26 DIAGNOSIS — K7581 Nonalcoholic steatohepatitis (NASH): Secondary | ICD-10-CM

## 2019-05-26 DIAGNOSIS — I10 Essential (primary) hypertension: Secondary | ICD-10-CM

## 2019-05-26 DIAGNOSIS — E118 Type 2 diabetes mellitus with unspecified complications: Secondary | ICD-10-CM

## 2019-08-21 ENCOUNTER — Other Ambulatory Visit: Payer: Self-pay | Admitting: Internal Medicine

## 2019-08-21 DIAGNOSIS — K269 Duodenal ulcer, unspecified as acute or chronic, without hemorrhage or perforation: Secondary | ICD-10-CM

## 2019-08-24 ENCOUNTER — Other Ambulatory Visit: Payer: Self-pay | Admitting: Internal Medicine

## 2019-08-24 DIAGNOSIS — K7581 Nonalcoholic steatohepatitis (NASH): Secondary | ICD-10-CM

## 2019-08-24 DIAGNOSIS — E118 Type 2 diabetes mellitus with unspecified complications: Secondary | ICD-10-CM

## 2019-10-23 ENCOUNTER — Other Ambulatory Visit: Payer: Self-pay | Admitting: Internal Medicine

## 2019-10-23 DIAGNOSIS — I10 Essential (primary) hypertension: Secondary | ICD-10-CM

## 2019-10-23 DIAGNOSIS — E118 Type 2 diabetes mellitus with unspecified complications: Secondary | ICD-10-CM

## 2019-11-09 LAB — HM DIABETES EYE EXAM

## 2019-11-15 ENCOUNTER — Other Ambulatory Visit: Payer: Self-pay | Admitting: Internal Medicine

## 2019-11-15 DIAGNOSIS — E785 Hyperlipidemia, unspecified: Secondary | ICD-10-CM

## 2019-11-20 ENCOUNTER — Other Ambulatory Visit: Payer: Self-pay | Admitting: Internal Medicine

## 2019-11-20 DIAGNOSIS — E118 Type 2 diabetes mellitus with unspecified complications: Secondary | ICD-10-CM

## 2019-11-20 DIAGNOSIS — K7581 Nonalcoholic steatohepatitis (NASH): Secondary | ICD-10-CM

## 2019-12-01 ENCOUNTER — Other Ambulatory Visit: Payer: Self-pay | Admitting: Internal Medicine

## 2019-12-01 DIAGNOSIS — E118 Type 2 diabetes mellitus with unspecified complications: Secondary | ICD-10-CM

## 2019-12-19 ENCOUNTER — Ambulatory Visit (INDEPENDENT_AMBULATORY_CARE_PROVIDER_SITE_OTHER): Payer: Commercial Managed Care - PPO | Admitting: Internal Medicine

## 2019-12-19 ENCOUNTER — Encounter: Payer: Self-pay | Admitting: Internal Medicine

## 2019-12-19 ENCOUNTER — Other Ambulatory Visit: Payer: Self-pay

## 2019-12-19 VITALS — BP 136/84 | HR 64 | Temp 97.2°F | Resp 16 | Ht 69.0 in | Wt 171.0 lb

## 2019-12-19 DIAGNOSIS — N1831 Chronic kidney disease, stage 3a: Secondary | ICD-10-CM | POA: Diagnosis not present

## 2019-12-19 DIAGNOSIS — E785 Hyperlipidemia, unspecified: Secondary | ICD-10-CM

## 2019-12-19 DIAGNOSIS — N400 Enlarged prostate without lower urinary tract symptoms: Secondary | ICD-10-CM | POA: Diagnosis not present

## 2019-12-19 DIAGNOSIS — E118 Type 2 diabetes mellitus with unspecified complications: Secondary | ICD-10-CM | POA: Diagnosis not present

## 2019-12-19 DIAGNOSIS — K7581 Nonalcoholic steatohepatitis (NASH): Secondary | ICD-10-CM | POA: Diagnosis not present

## 2019-12-19 DIAGNOSIS — I10 Essential (primary) hypertension: Secondary | ICD-10-CM | POA: Diagnosis not present

## 2019-12-19 DIAGNOSIS — Z23 Encounter for immunization: Secondary | ICD-10-CM

## 2019-12-19 DIAGNOSIS — Z0000 Encounter for general adult medical examination without abnormal findings: Secondary | ICD-10-CM | POA: Diagnosis not present

## 2019-12-19 LAB — HEPATIC FUNCTION PANEL
ALT: 21 U/L (ref 0–53)
AST: 19 U/L (ref 0–37)
Albumin: 4.5 g/dL (ref 3.5–5.2)
Alkaline Phosphatase: 59 U/L (ref 39–117)
Bilirubin, Direct: 0.2 mg/dL (ref 0.0–0.3)
Total Bilirubin: 0.9 mg/dL (ref 0.2–1.2)
Total Protein: 7.1 g/dL (ref 6.0–8.3)

## 2019-12-19 LAB — URINALYSIS, ROUTINE W REFLEX MICROSCOPIC
Bilirubin Urine: NEGATIVE
Hgb urine dipstick: NEGATIVE
Ketones, ur: NEGATIVE
Leukocytes,Ua: NEGATIVE
Nitrite: NEGATIVE
RBC / HPF: NONE SEEN (ref 0–?)
Specific Gravity, Urine: 1.02 (ref 1.000–1.030)
Total Protein, Urine: NEGATIVE
Urine Glucose: NEGATIVE
Urobilinogen, UA: 0.2 (ref 0.0–1.0)
pH: 6.5 (ref 5.0–8.0)

## 2019-12-19 LAB — CBC WITH DIFFERENTIAL/PLATELET
Basophils Absolute: 0.1 10*3/uL (ref 0.0–0.1)
Basophils Relative: 1.1 % (ref 0.0–3.0)
Eosinophils Absolute: 0.3 10*3/uL (ref 0.0–0.7)
Eosinophils Relative: 3.4 % (ref 0.0–5.0)
HCT: 42.1 % (ref 39.0–52.0)
Hemoglobin: 14.2 g/dL (ref 13.0–17.0)
Lymphocytes Relative: 35.7 % (ref 12.0–46.0)
Lymphs Abs: 2.9 10*3/uL (ref 0.7–4.0)
MCHC: 33.7 g/dL (ref 30.0–36.0)
MCV: 92.4 fl (ref 78.0–100.0)
Monocytes Absolute: 0.8 10*3/uL (ref 0.1–1.0)
Monocytes Relative: 9.5 % (ref 3.0–12.0)
Neutro Abs: 4.1 10*3/uL (ref 1.4–7.7)
Neutrophils Relative %: 50.3 % (ref 43.0–77.0)
Platelets: 213 10*3/uL (ref 150.0–400.0)
RBC: 4.56 Mil/uL (ref 4.22–5.81)
RDW: 13 % (ref 11.5–15.5)
WBC: 8.1 10*3/uL (ref 4.0–10.5)

## 2019-12-19 LAB — BASIC METABOLIC PANEL
BUN: 22 mg/dL (ref 6–23)
CO2: 31 mEq/L (ref 19–32)
Calcium: 9.8 mg/dL (ref 8.4–10.5)
Chloride: 103 mEq/L (ref 96–112)
Creatinine, Ser: 1.37 mg/dL (ref 0.40–1.50)
GFR: 51.83 mL/min — ABNORMAL LOW (ref >60.00)
Glucose, Bld: 68 mg/dL — ABNORMAL LOW (ref 70–99)
Potassium: 3.8 mEq/L (ref 3.5–5.1)
Sodium: 140 mEq/L (ref 135–145)

## 2019-12-19 LAB — LIPID PANEL
Cholesterol: 114 mg/dL (ref 0–200)
HDL: 38.6 mg/dL — ABNORMAL LOW (ref >39.00)
LDL Cholesterol: 48 mg/dL (ref 0–99)
NonHDL: 75.05
Total CHOL/HDL Ratio: 3
Triglycerides: 135 mg/dL (ref 0.0–149.0)
VLDL: 27 mg/dL (ref 0.0–40.0)

## 2019-12-19 LAB — HEMOGLOBIN A1C: Hgb A1c MFr Bld: 6 % (ref 4.6–6.5)

## 2019-12-19 LAB — PROTIME-INR
INR: 0.9 ratio (ref 0.8–1.0)
Prothrombin Time: 10.5 s (ref 9.6–13.1)

## 2019-12-19 LAB — PSA: PSA: 3.28 ng/mL (ref 0.10–4.00)

## 2019-12-19 LAB — CK: Total CK: 66 U/L (ref 7–232)

## 2019-12-19 NOTE — Progress Notes (Signed)
Subjective:  Patient ID: Brett Robles, male    DOB: June 28, 1950  Age: 69 y.o. MRN: [id number]  CC: Annual Exam, Hypertension, Hyperlipidemia, and Diabetes  This visit occurred during the SARS-CoV-2 public health emergency.  Safety protocols were in place, including screening questions prior to the visit, additional usage of staff PPE, and extensive cleaning of exam room while observing appropriate contact time as indicated for disinfecting solutions.    HPI Brett Robles presents for a CPX.  He has rare polydipsia but denies polyuria or polyphagia.  He tells me his blood sugars have been well controlled.  He is active and denies any recent episodes of chest pain, shortness of breath, dizziness, lightheadedness, edema, palpitations, or DOE.  He tells me his blood pressure has been well controlled.  He has been working on his lifestyle modifications and is tolerating pioglitazone well with no abdominal pain, nausea, vomiting, or loss of appetite.  Outpatient Medications Prior to Visit  Medication Sig Dispense Refill  . aspirin EC 81 MG tablet Take 1 tablet (81 mg total) by mouth daily. 90 tablet 1  . BREO ELLIPTA 100-25 MCG/INH AEPB TAKE 1 PUFF BY MOUTH EVERY DAY 60 each 3  . EDARBYCLOR 40-12.5 MG TABS TAKE ONE TABLET BY MOUTH ONCE DAILY 90 tablet 0  . metformin (FORTAMET) 1000 MG (OSM) 24 hr tablet TAKE 1 TABLET BY MOUTH EVERY DAY WITH BREAKFAST 90 tablet 0  . pantoprazole (PROTONIX) 40 MG tablet TAKE 1 TABLET BY MOUTH EVERY DAY 90 tablet 1  . pioglitazone (ACTOS) 15 MG tablet TAKE ONE TABLET BY MOUTH ONCE DAILY 90 tablet 0  . rosuvastatin (CRESTOR) 10 MG tablet TAKE 1 TABLET BY MOUTH EVERY DAY 90 tablet 1  . sildenafil (REVATIO) 20 MG tablet Take 4 tablets (80 mg total) by mouth daily as needed. 60 tablet 11   No facility-administered medications prior to visit.    ROS Review of Systems  Constitutional: Negative for appetite change, chills, diaphoresis, fatigue and fever.  HENT:  Negative.   Eyes: Negative for visual disturbance.  Respiratory: Negative for cough, chest tightness, shortness of breath and wheezing.   Cardiovascular: Negative for chest pain, palpitations and leg swelling.  Gastrointestinal: Negative for abdominal pain, constipation, diarrhea, nausea and vomiting.  Endocrine: Positive for polydipsia. Negative for polyphagia and polyuria.  Genitourinary: Negative.  Negative for difficulty urinating and testicular pain.  Musculoskeletal: Negative for arthralgias and myalgias.  Skin: Negative.  Negative for color change and pallor.  Neurological: Negative.  Negative for dizziness, weakness, light-headedness and numbness.  Hematological: Negative for adenopathy. Does not bruise/bleed easily.  Psychiatric/Behavioral: Negative.     Objective:  BP 136/84   Pulse 64   Temp (!) 97.2 F (36.2 C) (Oral)   Resp 16   Ht 5' 9"  (1.753 m)   Wt 171 lb (77.6 kg)   SpO2 97%   BMI 25.25 kg/m   BP Readings from Last 3 Encounters:  12/19/19 136/84  12/13/18 138/86  08/29/18 (!) 142/80    Wt Readings from Last 3 Encounters:  12/19/19 171 lb (77.6 kg)  12/13/18 176 lb (79.8 kg)  08/29/18 175 lb (79.4 kg)    Physical Exam Vitals reviewed.  Constitutional:      Appearance: Normal appearance.  HENT:     Nose: Nose normal.     Mouth/Throat:     Mouth: Mucous membranes are moist.  Eyes:     General: No scleral icterus.    Conjunctiva/sclera: Conjunctivae normal.  Cardiovascular:  Rate and Rhythm: Normal rate and regular rhythm.     Heart sounds: No murmur heard.   Pulmonary:     Effort: Pulmonary effort is normal.     Breath sounds: No stridor. No wheezing, rhonchi or rales.  Abdominal:     General: Abdomen is flat. Bowel sounds are normal. There is no distension.     Palpations: Abdomen is soft. There is no hepatomegaly, splenomegaly or mass.     Tenderness: There is no abdominal tenderness. There is no guarding.     Hernia: There is no hernia  in the left inguinal area or right inguinal area.  Genitourinary:    Pubic Area: No rash.      Penis: Normal and circumcised.      Testes: Normal.        Right: Mass, tenderness or swelling not present.        Left: Mass, tenderness or swelling not present.     Epididymis:     Right: Normal. Not inflamed or enlarged. No mass.     Left: Normal. Not inflamed or enlarged. No mass.     Prostate: Enlarged (1+ smooth symm BPH). Not tender and no nodules present.     Rectum: Normal. Guaiac result negative. No mass, tenderness, anal fissure, external hemorrhoid or internal hemorrhoid. Normal anal tone.  Musculoskeletal:        General: Normal range of motion.     Cervical back: Neck supple.     Right lower leg: No edema.     Left lower leg: No edema.  Lymphadenopathy:     Cervical: No cervical adenopathy.     Lower Body: No right inguinal adenopathy. No left inguinal adenopathy.  Skin:    General: Skin is warm and dry.     Findings: No rash.  Neurological:     General: No focal deficit present.     Mental Status: He is alert.  Psychiatric:        Mood and Affect: Mood normal.        Behavior: Behavior normal.     Lab Results  Component Value Date   WBC 8.1 12/19/2019   HGB 14.2 12/19/2019   HCT 42.1 12/19/2019   PLT 213.0 12/19/2019   GLUCOSE 68 (L) 12/19/2019   CHOL 114 12/19/2019   TRIG 135.0 12/19/2019   HDL 38.60 (L) 12/19/2019   LDLCALC 48 12/19/2019   ALT 21 12/19/2019   AST 19 12/19/2019   NA 140 12/19/2019   K 3.8 12/19/2019   CL 103 12/19/2019   CREATININE 1.37 12/19/2019   BUN 22 12/19/2019   CO2 31 12/19/2019   TSH 2.79 12/13/2018   PSA 3.28 12/19/2019   INR 0.9 12/19/2019   HGBA1C 6.0 12/19/2019   MICROALBUR <0.7 08/29/2018    US Abdomen Limited RUQ  Result Date: 01/25/2019 CLINICAL DATA:  69 year old male with elevated LFTs. EXAM: ULTRASOUND ABDOMEN LIMITED RIGHT UPPER QUADRANT COMPARISON:  None. FINDINGS: Gallbladder: No gallstones or wall thickening  visualized. No sonographic Murphy sign noted by sonographer. Common bile duct: Diameter: 5 mm Liver: There is diffuse increased liver echogenicity most commonly seen in the setting of fatty infiltration. Superimposed inflammation or fibrosis is not excluded. Clinical correlation is recommended. Portal vein is patent on color Doppler imaging with normal direction of blood flow towards the liver. Other: None. IMPRESSION: Fatty liver, otherwise unremarkable right upper quadrant ultrasound. Electronically Signed   By: Anner Crete M.D.   On: 01/25/2019 10:20    Assessment &  Plan:   Brett Robles was seen today for annual exam, hypertension, hyperlipidemia and diabetes.  Diagnoses and all orders for this visit:  Flu vaccine need -     Flu Vaccine QUAD High Dose(Fluad)  Essential hypertension, benign- His blood pressure is adequately well controlled.  Electrolytes and renal function are normal.  Will continue the combination of an ARB and thiazide diuretic. -     CBC with Differential/Platelet; Future -     Basic metabolic panel; Future -     Basic metabolic panel -     CBC with Differential/Platelet  NASH (nonalcoholic steatohepatitis)- His LFTs are normal.  He continues to improve his lifestyle modifications.  Will continue the current dose of pioglitazone.  I have also recommended that he get vaccinated against hepatitis A and B. -     Protime-INR; Future -     Hepatitis B surface antibody,quantitative; Future -     Hepatitis B surface antigen; Future -     Hepatitis B core antibody, total; Future -     Hepatitis A antibody, total; Future -     Hepatic function panel; Future -     Hepatic function panel -     Hepatitis A antibody, total -     Hepatitis B core antibody, total -     Hepatitis B surface antigen -     Hepatitis B surface antibody,quantitative -     Protime-INR  Type II diabetes mellitus with manifestations ([place])- His A1c is at 6.0%.  His blood sugar is adequately well  controlled.  Will continue the current dose of Metformin. -     Basic metabolic panel; Future -     Hemoglobin A1c; Future -     HM Diabetes Foot Exam -     Hemoglobin A1c -     Basic metabolic panel  Benign prostatic hyperplasia without lower urinary tract symptoms- His PSA is in the normal range and he is asymptomatic. -     PSA; Future -     Urinalysis, Routine w reflex microscopic; Future -     Urinalysis, Routine w reflex microscopic -     PSA  Stage 3a chronic kidney disease ([place])- Will continue to maintain control of his blood pressure and his blood sugar.  His renal function is stable.  He will avoid nephrotoxic agents. -     Basic metabolic panel; Future -     Urinalysis, Routine w reflex microscopic; Future -     Urinalysis, Routine w reflex microscopic -     Basic metabolic panel  Hyperlipidemia with target LDL less than 130- He has achieved his LDL goal and is doing well on the statin. -     Lipid panel; Future -     CK; Future -     CK -     Lipid panel  Routine general medical examination at a health care facility- Exam completed, labs reviewed, vaccines reviewed and updated, cancer screenings are up-to-date, patient education was given.   I am having Brett L. Kamaka "Don" maintain his aspirin EC, sildenafil, Breo Ellipta, pantoprazole, Edarbyclor, rosuvastatin, pioglitazone, and metformin.  No orders of the defined types were placed in this encounter.  In addition to CPE, I spent 50 minutes in preparing to see the patient by review of recent labs, imaging and procedures, obtaining and reviewing separately obtained history, communicating with the patient and family or caregiver, ordering medications, tests or procedures, and documenting clinical information in the EHR including the  differential Dx, treatment, and any further evaluation and other management of  1. Essential hypertension, benign 2. NASH (nonalcoholic steatohepatitis) 3. Type II diabetes mellitus with  manifestations ([place]) 4. Benign prostatic hyperplasia without lower urinary tract symptoms 5. Stage 3a chronic kidney disease ([place]) 6. Hyperlipidemia with target LDL less than 130     Follow-up: Return in about 6 months (around 06/17/2020).  Scarlette Calico, MD

## 2019-12-19 NOTE — Patient Instructions (Signed)

## 2019-12-22 LAB — HEPATITIS B SURFACE ANTIBODY, QUANTITATIVE: Hep B S AB Quant (Post): <5 m[IU]/mL — ABNORMAL LOW (ref >=10)

## 2019-12-22 LAB — HEPATITIS B CORE ANTIBODY, TOTAL: Hep B Core Total Ab: NONREACTIVE

## 2019-12-22 LAB — HEPATITIS B SURFACE ANTIGEN: Hepatitis B Surface Ag: NONREACTIVE

## 2019-12-22 LAB — HEPATITIS A ANTIBODY, TOTAL: Hepatitis A AB,Total: REACTIVE — AB

## 2020-01-11 ENCOUNTER — Other Ambulatory Visit: Payer: Self-pay | Admitting: Internal Medicine

## 2020-01-11 DIAGNOSIS — E118 Type 2 diabetes mellitus with unspecified complications: Secondary | ICD-10-CM

## 2020-01-11 DIAGNOSIS — I10 Essential (primary) hypertension: Secondary | ICD-10-CM

## 2020-02-11 ENCOUNTER — Other Ambulatory Visit: Payer: Self-pay | Admitting: Internal Medicine

## 2020-02-11 DIAGNOSIS — K269 Duodenal ulcer, unspecified as acute or chronic, without hemorrhage or perforation: Secondary | ICD-10-CM

## 2020-02-11 DIAGNOSIS — T39395A Adverse effect of other nonsteroidal anti-inflammatory drugs [NSAID], initial encounter: Secondary | ICD-10-CM

## 2020-03-09 ENCOUNTER — Other Ambulatory Visit: Payer: Self-pay | Admitting: Internal Medicine

## 2020-03-09 DIAGNOSIS — E118 Type 2 diabetes mellitus with unspecified complications: Secondary | ICD-10-CM

## 2020-03-15 IMAGING — US US ABDOMEN LIMITED
1 series · 14 of 25 positions shown · non-contrast
Comparison: None.

CLINICAL DATA: 68-year-old male with elevated LFTs.

EXAM:
ULTRASOUND ABDOMEN LIMITED RIGHT UPPER QUADRANT

[Series 1: us abdomen limited · 0.20mm/px · 14 of 65 slices shown]
[im 1/65]
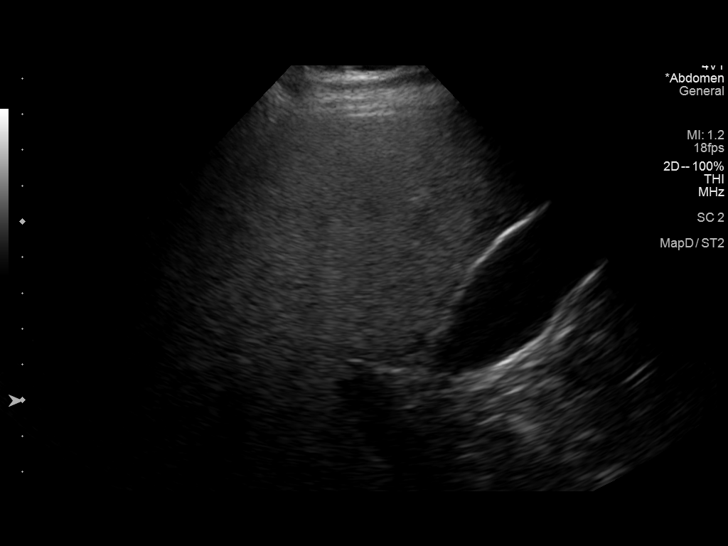
[im 6/65]
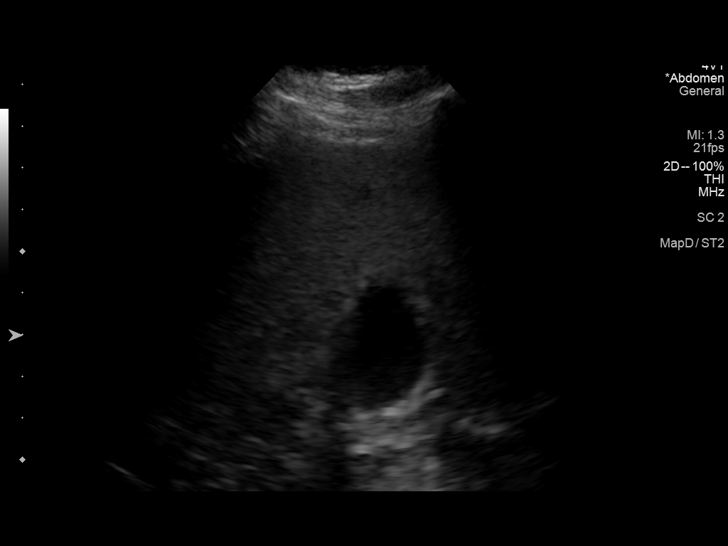
[im 11/65]
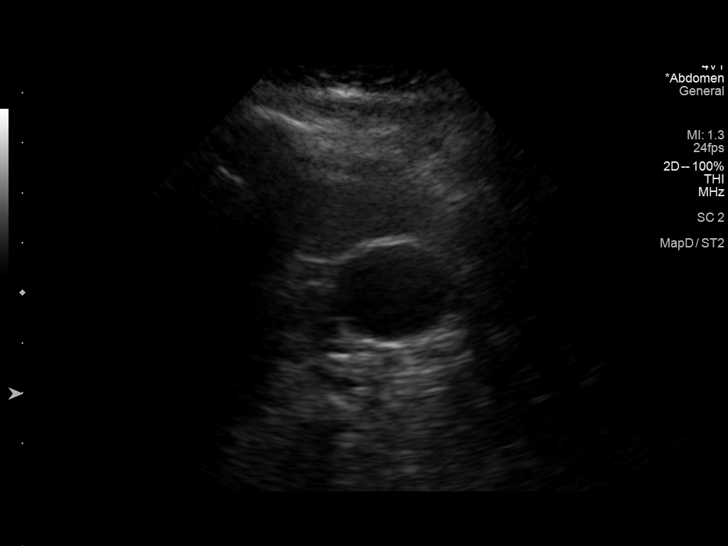
[im 17/65]
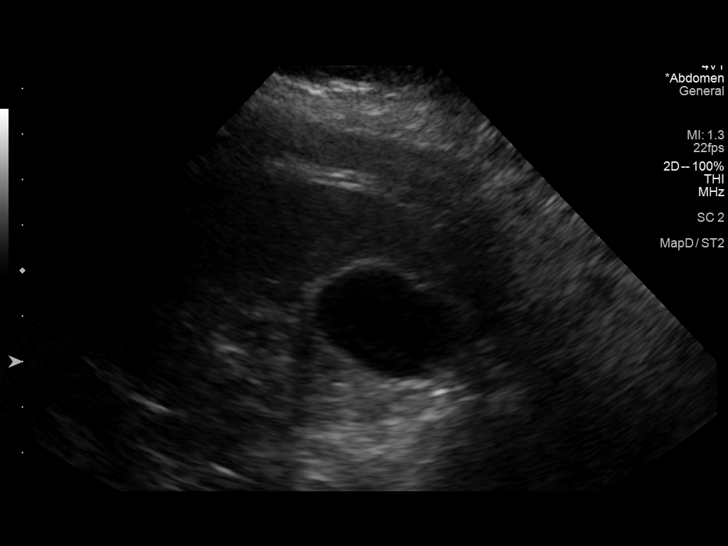
[im 22/65]
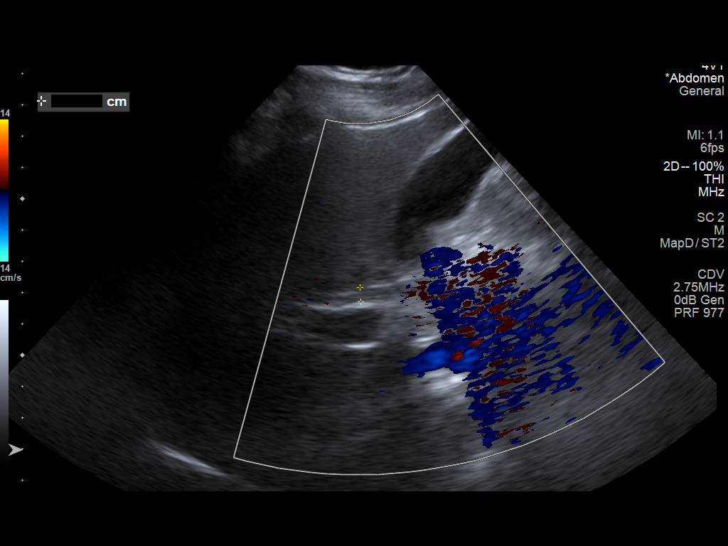
[im 25/65]
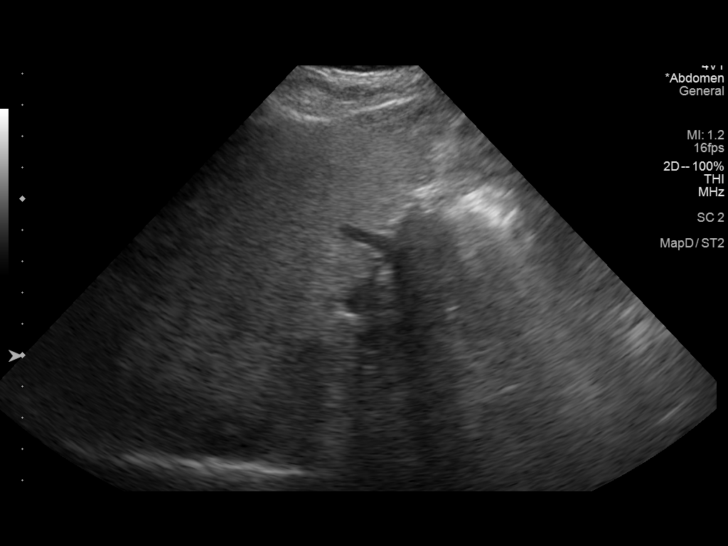
[im 30/65]
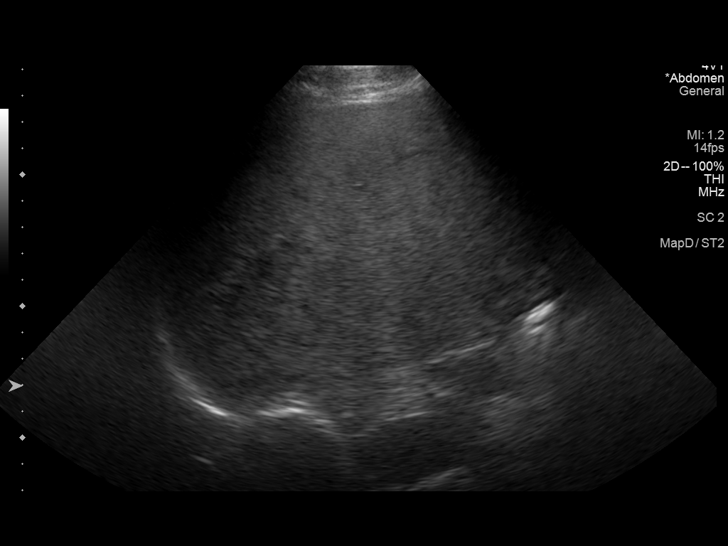
[im 35/65]
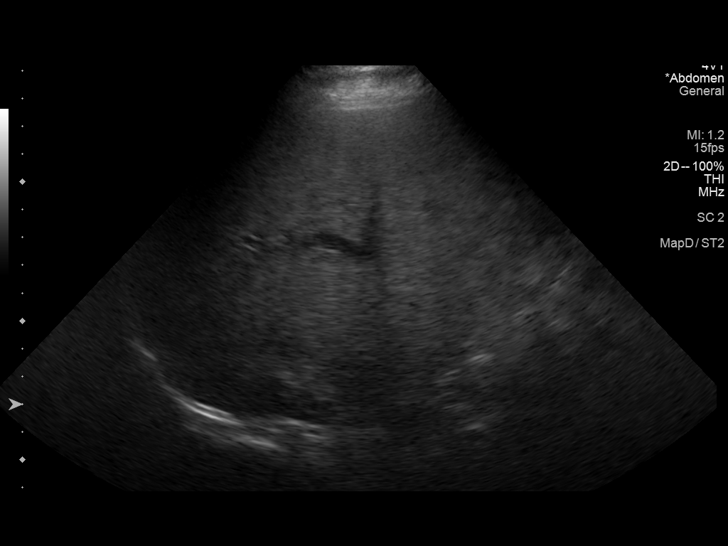
[im 41/65]
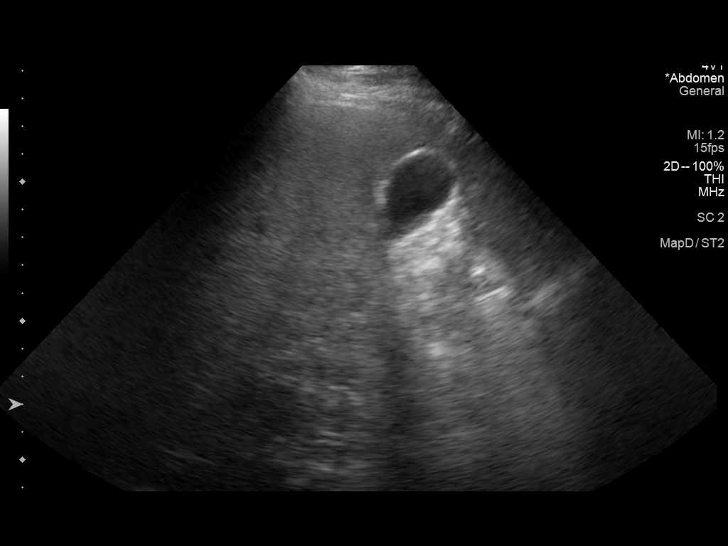
[im 43/65]
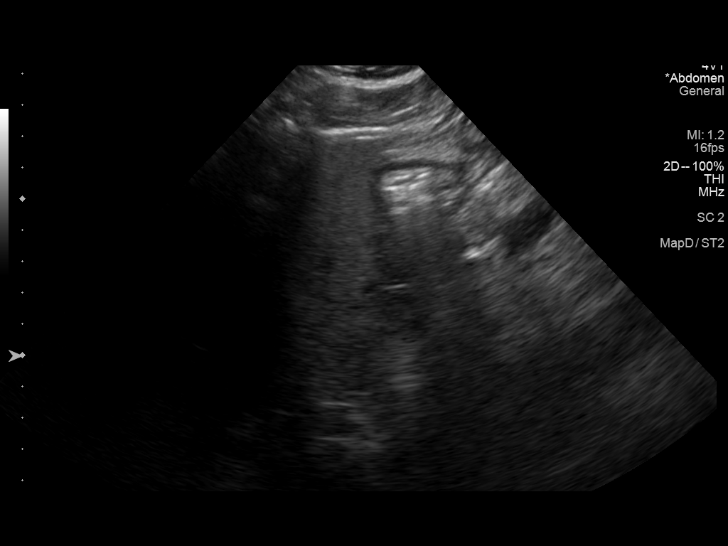
[im 49/65]
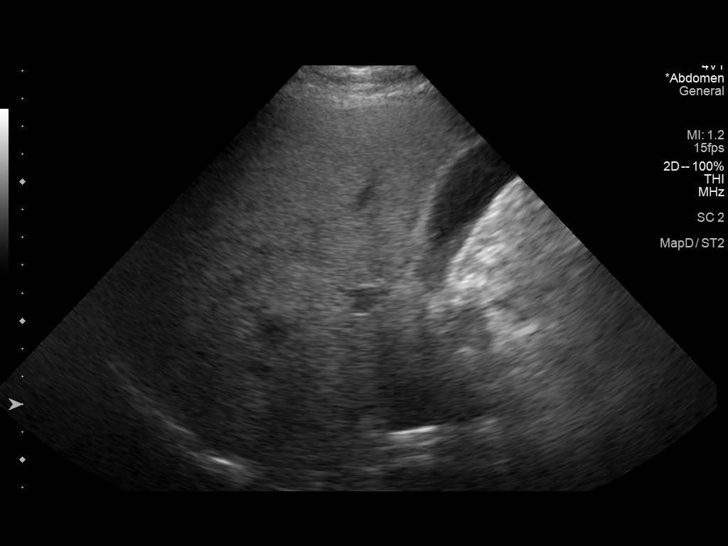
[im 54/65]
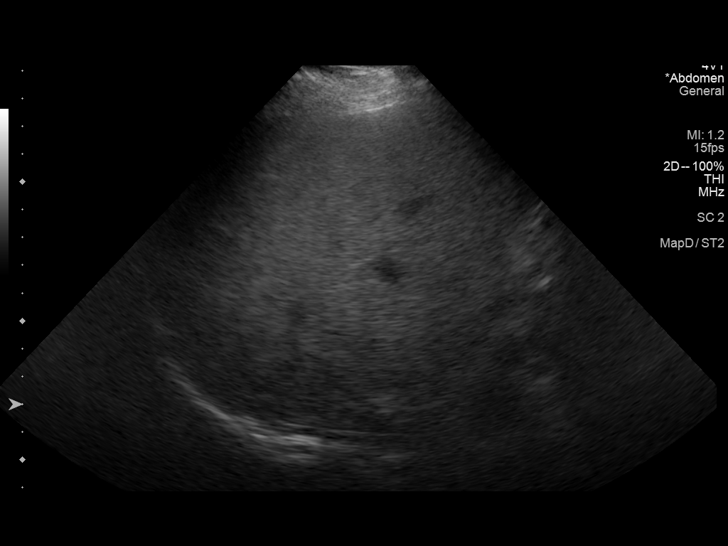
[im 59/65]
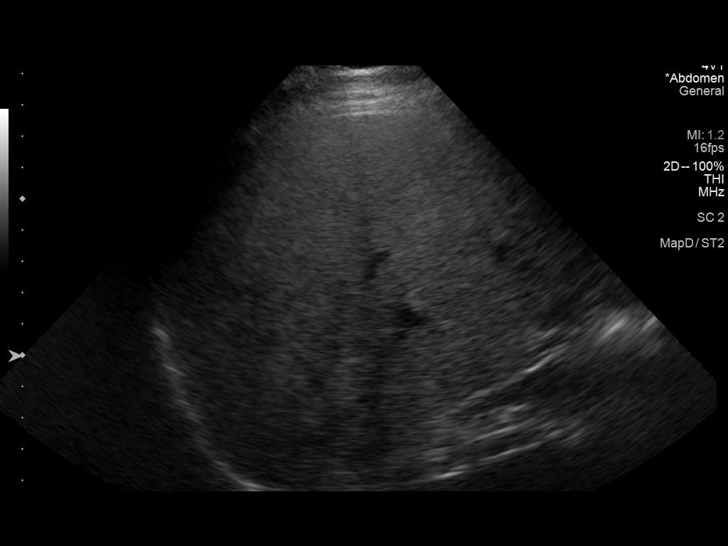
[im 65/65]
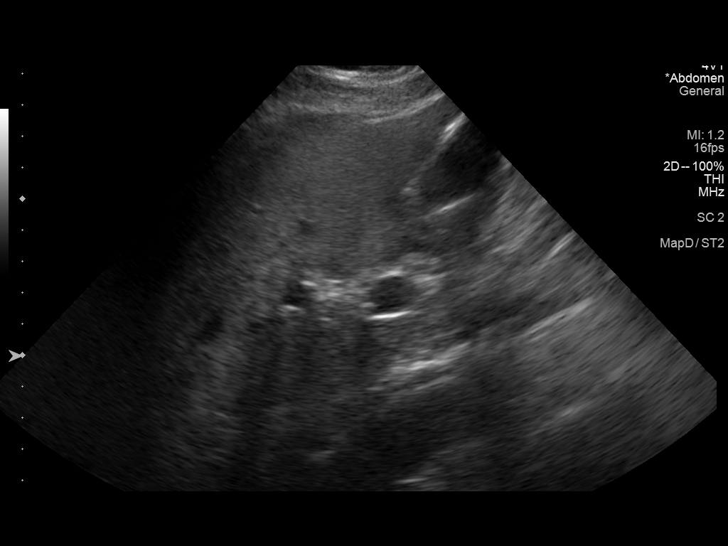

[14 of 25 positions shown; findings below may reference images not displayed]

FINDINGS: Gallbladder:

No gallstones or wall thickening visualized. No sonographic Murphy
sign noted by sonographer.

Common bile duct:

Diameter: 5 mm

Liver:

There is diffuse increased liver echogenicity most commonly seen in
the setting of fatty infiltration. Superimposed inflammation or
fibrosis is not excluded. Clinical correlation is recommended.
Portal vein is patent on color Doppler imaging with normal direction
of blood flow towards the liver.

Other: None.
IMPRESSION: Fatty liver, otherwise unremarkable right upper quadrant ultrasound.

## 2020-04-05 ENCOUNTER — Other Ambulatory Visit: Payer: Self-pay | Admitting: Internal Medicine

## 2020-04-05 DIAGNOSIS — I10 Essential (primary) hypertension: Secondary | ICD-10-CM

## 2020-04-05 DIAGNOSIS — E118 Type 2 diabetes mellitus with unspecified complications: Secondary | ICD-10-CM

## 2020-05-12 ENCOUNTER — Other Ambulatory Visit: Payer: Self-pay | Admitting: Internal Medicine

## 2020-05-12 DIAGNOSIS — E785 Hyperlipidemia, unspecified: Secondary | ICD-10-CM

## 2020-06-03 ENCOUNTER — Other Ambulatory Visit: Payer: Self-pay | Admitting: Internal Medicine

## 2020-06-03 DIAGNOSIS — E118 Type 2 diabetes mellitus with unspecified complications: Secondary | ICD-10-CM

## 2020-06-18 ENCOUNTER — Other Ambulatory Visit: Payer: Self-pay | Admitting: Internal Medicine

## 2020-06-18 DIAGNOSIS — K7581 Nonalcoholic steatohepatitis (NASH): Secondary | ICD-10-CM

## 2020-06-18 DIAGNOSIS — E118 Type 2 diabetes mellitus with unspecified complications: Secondary | ICD-10-CM

## 2020-07-05 ENCOUNTER — Other Ambulatory Visit: Payer: Self-pay | Admitting: Internal Medicine

## 2020-07-05 DIAGNOSIS — I10 Essential (primary) hypertension: Secondary | ICD-10-CM

## 2020-07-05 DIAGNOSIS — E118 Type 2 diabetes mellitus with unspecified complications: Secondary | ICD-10-CM

## 2020-07-08 ENCOUNTER — Telehealth: Payer: Self-pay | Admitting: Internal Medicine

## 2020-07-08 NOTE — Telephone Encounter (Signed)
1.Medication Requested: pioglitazone (ACTOS) 15 MG tablet  EDARBYCLOR 40-12.5 MG TABS    2. Pharmacy (Name, [facility]): [place], [facility] [address]  3. On Med List: yes   4. Last Visit with PCP: 12-19-19  5. Next visit date with PCP: 08-08-20   Agent: Please be advised that RX refills may take up to 3 business days. We ask that you follow-up with your pharmacy.

## 2020-07-09 ENCOUNTER — Other Ambulatory Visit: Payer: Self-pay | Admitting: Internal Medicine

## 2020-07-09 DIAGNOSIS — E118 Type 2 diabetes mellitus with unspecified complications: Secondary | ICD-10-CM

## 2020-07-09 DIAGNOSIS — I10 Essential (primary) hypertension: Secondary | ICD-10-CM

## 2020-07-09 DIAGNOSIS — K7581 Nonalcoholic steatohepatitis (NASH): Secondary | ICD-10-CM

## 2020-07-09 MED ORDER — EDARBYCLOR 40-12.5 MG PO TABS
ORAL_TABLET | ORAL | 0 refills | Status: DC
Start: 2020-07-09 — End: 2020-07-11

## 2020-07-09 MED ORDER — PIOGLITAZONE HCL 15 MG PO TABS
15.0000 mg | ORAL_TABLET | Freq: Every day | ORAL | 0 refills | Status: DC
Start: 2020-07-09 — End: 2020-07-11

## 2020-07-11 ENCOUNTER — Other Ambulatory Visit: Payer: Self-pay

## 2020-07-11 DIAGNOSIS — K7581 Nonalcoholic steatohepatitis (NASH): Secondary | ICD-10-CM

## 2020-07-11 DIAGNOSIS — E118 Type 2 diabetes mellitus with unspecified complications: Secondary | ICD-10-CM

## 2020-07-11 DIAGNOSIS — I10 Essential (primary) hypertension: Secondary | ICD-10-CM

## 2020-07-11 MED ORDER — PIOGLITAZONE HCL 15 MG PO TABS: 15.0000 mg | ORAL_TABLET | Freq: Every day | ORAL | 0 refills | Status: DC

## 2020-07-11 MED ORDER — EDARBYCLOR 40-12.5 MG PO TABS: ORAL_TABLET | ORAL | 0 refills | Status: DC

## 2020-07-11 NOTE — Telephone Encounter (Signed)
Meds has been corrected.

## 2020-07-11 NOTE — Telephone Encounter (Signed)
The patients medications were accidentally [facility] to [facility]   [facility] Azilsartan-Chlorthalidone (EDARBYCLOR) 40-12.5 MG TABS  Pt has not picked up any prescriptions, he would like for both of them to be sent to  [facility], [place] [address]

## 2020-08-08 ENCOUNTER — Encounter: Payer: Self-pay | Admitting: Internal Medicine

## 2020-08-08 ENCOUNTER — Ambulatory Visit: Payer: Commercial Managed Care - PPO | Admitting: Internal Medicine

## 2020-08-08 ENCOUNTER — Other Ambulatory Visit: Payer: Self-pay

## 2020-08-08 VITALS — BP 132/82 | HR 63 | Temp 98.1°F | Resp 16 | Ht 69.0 in | Wt 178.0 lb

## 2020-08-08 DIAGNOSIS — N400 Enlarged prostate without lower urinary tract symptoms: Secondary | ICD-10-CM

## 2020-08-08 DIAGNOSIS — N1831 Chronic kidney disease, stage 3a: Secondary | ICD-10-CM

## 2020-08-08 DIAGNOSIS — S51802A Unspecified open wound of left forearm, initial encounter: Secondary | ICD-10-CM | POA: Insufficient documentation

## 2020-08-08 DIAGNOSIS — E118 Type 2 diabetes mellitus with unspecified complications: Secondary | ICD-10-CM | POA: Diagnosis not present

## 2020-08-08 DIAGNOSIS — B001 Herpesviral vesicular dermatitis: Secondary | ICD-10-CM

## 2020-08-08 DIAGNOSIS — I10 Essential (primary) hypertension: Secondary | ICD-10-CM | POA: Diagnosis not present

## 2020-08-08 LAB — BASIC METABOLIC PANEL
BUN: 29 mg/dL — ABNORMAL HIGH (ref 6–23)
CO2: 29 mEq/L (ref 19–32)
Calcium: 9.7 mg/dL (ref 8.4–10.5)
Chloride: 105 mEq/L (ref 96–112)
Creatinine, Ser: 1.7 mg/dL — ABNORMAL HIGH (ref 0.40–1.50)
GFR: 39.83 mL/min — ABNORMAL LOW (ref >60.00)
Glucose, Bld: 96 mg/dL (ref 70–99)
Potassium: 4.8 mEq/L (ref 3.5–5.1)
Sodium: 142 mEq/L (ref 135–145)

## 2020-08-08 LAB — URINALYSIS, ROUTINE W REFLEX MICROSCOPIC
Bilirubin Urine: NEGATIVE
Hgb urine dipstick: NEGATIVE
Ketones, ur: NEGATIVE
Leukocytes,Ua: NEGATIVE
Nitrite: NEGATIVE
RBC / HPF: NONE SEEN (ref 0–?)
Specific Gravity, Urine: 1.025 (ref 1.000–1.030)
Total Protein, Urine: NEGATIVE
Urine Glucose: NEGATIVE
Urobilinogen, UA: 0.2 (ref 0.0–1.0)
pH: 6 (ref 5.0–8.0)

## 2020-08-08 LAB — HEMOGLOBIN A1C: Hgb A1c MFr Bld: 6.4 % (ref 4.6–6.5)

## 2020-08-08 LAB — MICROALBUMIN / CREATININE URINE RATIO
Creatinine,U: 180.4 mg/dL
Microalb Creat Ratio: 0.4 mg/g (ref 0.0–30.0)
Microalb, Ur: <0.7 mg/dL (ref 0.0–1.9)

## 2020-08-08 LAB — PSA: PSA: 3.36 ng/mL (ref 0.10–4.00)

## 2020-08-08 MED ORDER — DAPAGLIFLOZIN PROPANEDIOL 10 MG PO TABS: 10.0000 mg | ORAL_TABLET | Freq: Every day | ORAL | 1 refills | Status: DC

## 2020-08-08 MED ORDER — VALACYCLOVIR HCL 1 G PO TABS: 1000.0000 mg | ORAL_TABLET | Freq: Two times a day (BID) | ORAL | 3 refills | Status: AC

## 2020-08-08 NOTE — Progress Notes (Signed)
Subjective:  Patient ID: Brett Robles, male    DOB: [birth year]  Age: 70 y.o. MRN: [id number]  CC: Hypertension  This visit occurred during the SARS-CoV-2 public health emergency.  Safety protocols were in place, including screening questions prior to the visit, additional usage of staff PPE, and extensive cleaning of exam room while observing appropriate contact time as indicated for disinfecting solutions.    HPI Brett Robles presents for f/up -  He was body surfing at the beach 10 days ago when he developed an abrasion on his left forearm.  He tells me it is healing well with no symptoms or complications.  He wants to get a tetanus shot.  He is very active, hiking and walking, and does not experience CP, DOE, palpitations, edema, or fatigue.  He has an outbreak of cold sores around his mouth every 2 to 3 weeks.  He wants to keep an antiviral on hand to treat the episodes.  Outpatient Medications Prior to Visit  Medication Sig Dispense Refill   aspirin EC 81 MG tablet Take 1 tablet (81 mg total) by mouth daily. 90 tablet 1   Azilsartan-Chlorthalidone (EDARBYCLOR) 40-12.5 MG TABS TAKE ONE (1) TABLET BY MOUTH ONCE DAILY 90 tablet 0   BREO ELLIPTA 100-25 MCG/INH AEPB TAKE 1 PUFF BY MOUTH EVERY DAY 60 each 3   metformin (FORTAMET) 1000 MG (OSM) 24 hr tablet TAKE 1 TABLET BY MOUTH EVERY DAY WITH BREAKFAST 90 tablet 0   pantoprazole (PROTONIX) 40 MG tablet TAKE 1 TABLET BY MOUTH EVERY DAY 90 tablet 1   pioglitazone (ACTOS) 15 MG tablet Take 1 tablet (15 mg total) by mouth daily. 90 tablet 0   rosuvastatin (CRESTOR) 10 MG tablet TAKE 1 TABLET BY MOUTH EVERY DAY 90 tablet 1   sildenafil (REVATIO) 20 MG tablet Take 4 tablets (80 mg total) by mouth daily as needed. 60 tablet 11   dorzolamide-timolol (COSOPT) 22.3-6.8 MG/ML ophthalmic solution Place 1 drop into the right eye 2 (two) times daily.     No facility-administered medications prior to visit.    ROS Review of Systems   Constitutional:  Positive for unexpected weight change. Negative for diaphoresis and fatigue.  HENT: Negative.    Eyes: Negative.   Respiratory:  Negative for cough, chest tightness, shortness of breath and wheezing.   Cardiovascular:  Negative for chest pain, palpitations and leg swelling.  Gastrointestinal:  Negative for abdominal pain, diarrhea, nausea and vomiting.  Endocrine: Positive for polydipsia. Negative for polyphagia and polyuria.  Genitourinary: Negative.  Negative for difficulty urinating, dysuria and urgency.  Musculoskeletal:  Negative for back pain and myalgias.  Skin:  Positive for wound. Negative for color change, pallor and rash.  Neurological: Negative.  Negative for dizziness, weakness and light-headedness.  Hematological:  Negative for adenopathy. Does not bruise/bleed easily.  Psychiatric/Behavioral: Negative.     Objective:  BP 132/82 (BP Location: Right Arm, Patient Position: Sitting, Cuff Size: Normal)   Pulse 63   Temp 98.1 F (36.7 C) (Oral)   Resp 16   Ht 5' 9"  (1.753 m)   Wt 178 lb (80.7 kg)   SpO2 97%   BMI 26.29 kg/m   BP Readings from Last 3 Encounters:  08/08/20 132/82  12/19/19 136/84  12/13/18 138/86    Wt Readings from Last 3 Encounters:  08/08/20 178 lb (80.7 kg)  12/19/19 171 lb (77.6 kg)  12/13/18 176 lb (79.8 kg)    Physical Exam Vitals reviewed.  Constitutional:  Appearance: Normal appearance.  HENT:     Nose: Nose normal.     Mouth/Throat:     Mouth: Mucous membranes are moist.  Eyes:     Conjunctiva/sclera: Conjunctivae normal.  Cardiovascular:     Rate and Rhythm: Normal rate and regular rhythm.     Heart sounds: No murmur heard. Pulmonary:     Effort: Pulmonary effort is normal.     Breath sounds: No stridor. No wheezing, rhonchi or rales.  Abdominal:     General: Abdomen is flat. Bowel sounds are normal. There is no distension.     Palpations: Abdomen is soft. There is no hepatomegaly, splenomegaly or mass.      Tenderness: There is no abdominal tenderness.  Musculoskeletal:        General: Normal range of motion.     Cervical back: Neck supple.     Comments: Abrasion on left forearm is filled with granulation tissue.  There is no erythema, induration, fluctuance, streaking, or exudate.  See photo.  Lymphadenopathy:     Cervical: No cervical adenopathy.  Skin:    General: Skin is warm and dry.  Neurological:     General: No focal deficit present.     Mental Status: He is alert.    Lab Results  Component Value Date   WBC 8.1 12/19/2019   HGB 14.2 12/19/2019   HCT 42.1 12/19/2019   PLT 213.0 12/19/2019   GLUCOSE 96 08/08/2020   CHOL 114 12/19/2019   TRIG 135.0 12/19/2019   HDL 38.60 (L) 12/19/2019   LDLCALC 48 12/19/2019   ALT 21 12/19/2019   AST 19 12/19/2019   NA 142 08/08/2020   K 4.8 08/08/2020   CL 105 08/08/2020   CREATININE 1.70 (H) 08/08/2020   BUN 29 (H) 08/08/2020   CO2 29 08/08/2020   TSH 2.79 12/13/2018   PSA 3.36 08/08/2020   INR 0.9 12/19/2019   HGBA1C 6.4 08/08/2020   MICROALBUR <0.7 08/08/2020    US Abdomen Limited RUQ  Result Date: 01/25/2019 CLINICAL DATA:  70 year old male with elevated LFTs. EXAM: ULTRASOUND ABDOMEN LIMITED RIGHT UPPER QUADRANT COMPARISON:  None. FINDINGS: Gallbladder: No gallstones or wall thickening visualized. No sonographic Murphy sign noted by sonographer. Common bile duct: Diameter: 5 mm Liver: There is diffuse increased liver echogenicity most commonly seen in the setting of fatty infiltration. Superimposed inflammation or fibrosis is not excluded. Clinical correlation is recommended. Portal vein is patent on color Doppler imaging with normal direction of blood flow towards the liver. Other: None. IMPRESSION: Fatty liver, otherwise unremarkable right upper quadrant ultrasound. Electronically Signed   By: Anner Crete M.D.   On: 01/25/2019 10:20    Assessment & Plan:   Nixon was seen today for hypertension.  Diagnoses and  all orders for this visit:  Essential hypertension, benign- His blood pressure is adequately well controlled. -     Basic metabolic panel; Future -     Urinalysis, Routine w reflex microscopic; Future -     Urinalysis, Routine w reflex microscopic -     Basic metabolic panel  Type II diabetes mellitus with manifestations ([place])- His A1C is at 6.4% and he has a decline in his renal function.  Will start Devine for renal protection. -     Basic metabolic panel; Future -     Hemoglobin A1c; Future -     Microalbumin / creatinine urine ratio; Future -     Microalbumin / creatinine urine ratio -  Hemoglobin A1c -     Basic metabolic panel -     dapagliflozin propanediol (FARXIGA) 10 MG TABS tablet; Take 1 tablet (10 mg total) by mouth daily before breakfast.  Stage 3a chronic kidney disease ([place])- See above. -     Basic metabolic panel; Future -     Urinalysis, Routine w reflex microscopic; Future -     Urinalysis, Routine w reflex microscopic -     Basic metabolic panel -     Ambulatory referral to Nephrology -     dapagliflozin propanediol (FARXIGA) 10 MG TABS tablet; Take 1 tablet (10 mg total) by mouth daily before breakfast.  Benign prostatic hyperplasia without lower urinary tract symptoms- His PSA is not rising.  This is a reassuring sign that he does not have prostate cancer. -     PSA; Future -     PSA  Open wound of left forearm, initial encounter- There is no evidence of infection. -     Tdap vaccine greater than or equal to 7yo IM  Recurrent cold sores -     valACYclovir (VALTREX) 1000 MG tablet; Take 1 tablet (1,000 mg total) by mouth 2 (two) times daily for 7 days.  I am having Deontre L. Gupton "Don" start on valACYclovir and dapagliflozin propanediol. I am also having him maintain his aspirin EC, sildenafil, Breo Ellipta, pantoprazole, rosuvastatin, metformin, Edarbyclor, pioglitazone, and dorzolamide-timolol.  Meds ordered this encounter  Medications   valACYclovir  (VALTREX) 1000 MG tablet    Sig: Take 1 tablet (1,000 mg total) by mouth 2 (two) times daily for 7 days.    Dispense:  14 tablet    Refill:  3   dapagliflozin propanediol (FARXIGA) 10 MG TABS tablet    Sig: Take 1 tablet (10 mg total) by mouth daily before breakfast.    Dispense:  90 tablet    Refill:  1     Follow-up: Return in about 6 months (around 02/08/2021).  Scarlette Calico, MD

## 2020-08-09 ENCOUNTER — Encounter: Payer: Self-pay | Admitting: Internal Medicine

## 2020-08-09 DIAGNOSIS — Z23 Encounter for immunization: Secondary | ICD-10-CM

## 2020-08-09 DIAGNOSIS — S51802A Unspecified open wound of left forearm, initial encounter: Secondary | ICD-10-CM | POA: Diagnosis not present

## 2020-08-14 ENCOUNTER — Other Ambulatory Visit: Payer: Self-pay | Admitting: Internal Medicine

## 2020-08-14 DIAGNOSIS — T39395A Adverse effect of other nonsteroidal anti-inflammatory drugs [NSAID], initial encounter: Secondary | ICD-10-CM

## 2020-08-18 ENCOUNTER — Other Ambulatory Visit: Payer: Self-pay | Admitting: Internal Medicine

## 2020-08-18 DIAGNOSIS — E118 Type 2 diabetes mellitus with unspecified complications: Secondary | ICD-10-CM

## 2020-08-23 ENCOUNTER — Encounter: Payer: Self-pay | Admitting: Internal Medicine

## 2020-08-26 ENCOUNTER — Other Ambulatory Visit: Payer: Self-pay | Admitting: Internal Medicine

## 2020-08-26 DIAGNOSIS — U071 COVID-19: Secondary | ICD-10-CM | POA: Insufficient documentation

## 2020-08-26 DIAGNOSIS — J40 Bronchitis, not specified as acute or chronic: Secondary | ICD-10-CM | POA: Insufficient documentation

## 2020-08-26 MED ORDER — MOLNUPIRAVIR EUA 200MG CAPSULE: 4.0000 | ORAL_CAPSULE | Freq: Two times a day (BID) | ORAL | 0 refills | Status: AC

## 2020-10-04 LAB — BASIC METABOLIC PANEL
BUN: 43 — AB (ref 4–21)
CO2: 22 (ref 13–22)
Chloride: 106 (ref 99–108)
Creatinine: 3.2 — AB (ref 0.6–1.3)
Glucose: 85
Potassium: 5.4 — AB (ref 3.4–5.3)
Sodium: 142 (ref 137–147)

## 2020-10-04 LAB — MICROALBUMIN / CREATININE URINE RATIO: Microalb Creat Ratio: 10.7

## 2020-10-04 LAB — COMPREHENSIVE METABOLIC PANEL: Calcium: 10.5 (ref 8.7–10.7)

## 2020-10-04 LAB — MICROALBUMIN, URINE: Microalb, Ur: 81.6

## 2020-10-07 ENCOUNTER — Other Ambulatory Visit: Payer: Self-pay | Admitting: Internal Medicine

## 2020-10-07 DIAGNOSIS — I10 Essential (primary) hypertension: Secondary | ICD-10-CM

## 2020-10-07 DIAGNOSIS — K7581 Nonalcoholic steatohepatitis (NASH): Secondary | ICD-10-CM

## 2020-10-07 DIAGNOSIS — E118 Type 2 diabetes mellitus with unspecified complications: Secondary | ICD-10-CM

## 2020-11-06 LAB — HM DIABETES EYE EXAM

## 2020-11-12 ENCOUNTER — Other Ambulatory Visit: Payer: Self-pay | Admitting: Internal Medicine

## 2020-11-12 DIAGNOSIS — E785 Hyperlipidemia, unspecified: Secondary | ICD-10-CM

## 2020-11-28 ENCOUNTER — Telehealth: Payer: Self-pay | Admitting: Internal Medicine

## 2020-11-28 ENCOUNTER — Other Ambulatory Visit: Payer: Self-pay | Admitting: Internal Medicine

## 2020-11-28 DIAGNOSIS — M10371 Gout due to renal impairment, right ankle and foot: Secondary | ICD-10-CM | POA: Insufficient documentation

## 2020-11-28 MED ORDER — METHYLPREDNISOLONE 4 MG PO TBPK: ORAL_TABLET | ORAL | 0 refills | Status: AC

## 2020-11-28 NOTE — Telephone Encounter (Signed)
Pt stated that he would like know what he can do for gout. I offered an OV but he stated that he is traveling on the road and is unable to come in for a visit at this time. Please advise.

## 2020-11-28 NOTE — Telephone Encounter (Signed)
Patient requesting a call back to discuss gout in left foot

## 2020-11-29 NOTE — Telephone Encounter (Signed)
Called pt, LVM stating Rx was sent.

## 2020-12-24 ENCOUNTER — Encounter: Payer: Self-pay | Admitting: Internal Medicine

## 2020-12-24 ENCOUNTER — Other Ambulatory Visit: Payer: Self-pay

## 2020-12-24 ENCOUNTER — Ambulatory Visit (INDEPENDENT_AMBULATORY_CARE_PROVIDER_SITE_OTHER): Payer: Commercial Managed Care - PPO | Admitting: Internal Medicine

## 2020-12-24 VITALS — BP 142/94 | HR 61 | Temp 98.2°F | Resp 16 | Ht 69.0 in | Wt 173.0 lb

## 2020-12-24 DIAGNOSIS — E785 Hyperlipidemia, unspecified: Secondary | ICD-10-CM | POA: Diagnosis not present

## 2020-12-24 DIAGNOSIS — J4520 Mild intermittent asthma, uncomplicated: Secondary | ICD-10-CM

## 2020-12-24 DIAGNOSIS — Z0000 Encounter for general adult medical examination without abnormal findings: Secondary | ICD-10-CM | POA: Diagnosis not present

## 2020-12-24 DIAGNOSIS — K7581 Nonalcoholic steatohepatitis (NASH): Secondary | ICD-10-CM

## 2020-12-24 DIAGNOSIS — N400 Enlarged prostate without lower urinary tract symptoms: Secondary | ICD-10-CM

## 2020-12-24 DIAGNOSIS — N1831 Chronic kidney disease, stage 3a: Secondary | ICD-10-CM

## 2020-12-24 DIAGNOSIS — Z23 Encounter for immunization: Secondary | ICD-10-CM

## 2020-12-24 DIAGNOSIS — M21611 Bunion of right foot: Secondary | ICD-10-CM

## 2020-12-24 DIAGNOSIS — E118 Type 2 diabetes mellitus with unspecified complications: Secondary | ICD-10-CM

## 2020-12-24 DIAGNOSIS — I10 Essential (primary) hypertension: Secondary | ICD-10-CM

## 2020-12-24 LAB — CBC WITH DIFFERENTIAL/PLATELET
Basophils Absolute: 0 10*3/uL (ref 0.0–0.1)
Basophils Relative: 0.6 % (ref 0.0–3.0)
Eosinophils Absolute: 0.2 10*3/uL (ref 0.0–0.7)
Eosinophils Relative: 2.1 % (ref 0.0–5.0)
HCT: 38.8 % — ABNORMAL LOW (ref 39.0–52.0)
Hemoglobin: 13 g/dL (ref 13.0–17.0)
Lymphocytes Relative: 32.9 % (ref 12.0–46.0)
Lymphs Abs: 2.7 10*3/uL (ref 0.7–4.0)
MCHC: 33.6 g/dL (ref 30.0–36.0)
MCV: 93.5 fl (ref 78.0–100.0)
Monocytes Absolute: 0.7 10*3/uL (ref 0.1–1.0)
Monocytes Relative: 8.3 % (ref 3.0–12.0)
Neutro Abs: 4.6 10*3/uL (ref 1.4–7.7)
Neutrophils Relative %: 56.1 % (ref 43.0–77.0)
Platelets: 208 10*3/uL (ref 150.0–400.0)
RBC: 4.14 Mil/uL — ABNORMAL LOW (ref 4.22–5.81)
RDW: 13.5 % (ref 11.5–15.5)
WBC: 8.2 10*3/uL (ref 4.0–10.5)

## 2020-12-24 LAB — LIPID PANEL
Cholesterol: 110 mg/dL (ref 0–200)
HDL: 39.4 mg/dL (ref >39.00)
LDL Cholesterol: 44 mg/dL (ref 0–99)
NonHDL: 71.09
Total CHOL/HDL Ratio: 3
Triglycerides: 136 mg/dL (ref 0.0–149.0)
VLDL: 27.2 mg/dL (ref 0.0–40.0)

## 2020-12-24 LAB — TSH: TSH: 2.2 u[IU]/mL (ref 0.35–5.50)

## 2020-12-24 LAB — HEPATIC FUNCTION PANEL
ALT: 17 U/L (ref 0–53)
AST: 17 U/L (ref 0–37)
Albumin: 4.5 g/dL (ref 3.5–5.2)
Alkaline Phosphatase: 49 U/L (ref 39–117)
Bilirubin, Direct: 0.2 mg/dL (ref 0.0–0.3)
Total Bilirubin: 0.9 mg/dL (ref 0.2–1.2)
Total Protein: 7.1 g/dL (ref 6.0–8.3)

## 2020-12-24 LAB — PSA: PSA: 3.74 ng/mL (ref 0.10–4.00)

## 2020-12-24 LAB — HEMOGLOBIN A1C: Hgb A1c MFr Bld: 6.2 % (ref 4.6–6.5)

## 2020-12-24 MED ORDER — EDARBYCLOR 40-12.5 MG PO TABS: ORAL_TABLET | ORAL | 0 refills | Status: DC

## 2020-12-24 MED ORDER — PIOGLITAZONE HCL 15 MG PO TABS: 15.0000 mg | ORAL_TABLET | Freq: Every day | ORAL | 0 refills | Status: DC

## 2020-12-24 NOTE — Progress Notes (Signed)
Subjective:  Patient ID: Brett Robles, male    DOB: 04-09-1950  Age: 70 y.o. MRN: [id number]  CC: Annual Exam, Hypertension, Hyperlipidemia, and Diabetes  This visit occurred during the SARS-CoV-2 public health emergency.  Safety protocols were in place, including screening questions prior to the visit, additional usage of staff PPE, and extensive cleaning of exam room while observing appropriate contact time as indicated for disinfecting solutions.    HPI Brett Robles presents for a CPX.  He saw nephrology 2 months ago and his GFR is down to 20.  He is active and denies chest pain, shortness of breath, dizziness, lightheadedness, edema, diaphoresis, or fatigue.  Outpatient Medications Prior to Visit  Medication Sig Dispense Refill   aspirin EC 81 MG tablet Take 1 tablet (81 mg total) by mouth daily. 90 tablet 1   BREO ELLIPTA 100-25 MCG/INH AEPB TAKE 1 PUFF BY MOUTH EVERY DAY 60 each 3   dorzolamide-timolol (COSOPT) 22.3-6.8 MG/ML ophthalmic solution Place 1 drop into the right eye 2 (two) times daily.     pantoprazole (PROTONIX) 40 MG tablet TAKE 1 TABLET BY MOUTH EVERY DAY 90 tablet 1   rosuvastatin (CRESTOR) 10 MG tablet TAKE 1 TABLET BY MOUTH EVERY DAY 90 tablet 1   Azilsartan-Chlorthalidone (EDARBYCLOR) 40-12.5 MG TABS TAKE ONE TABLET BY MOUTH ONCE DAILY 90 tablet 0   dapagliflozin propanediol (FARXIGA) 10 MG TABS tablet Take 1 tablet (10 mg total) by mouth daily before breakfast. 90 tablet 1   metformin (FORTAMET) 1000 MG (OSM) 24 hr tablet TAKE 1 TABLET BY MOUTH EVERY DAY WITH BREAKFAST 90 tablet 1   pioglitazone (ACTOS) 15 MG tablet TAKE ONE TABLET BY MOUTH ONCE DAILY 90 tablet 0   sildenafil (REVATIO) 20 MG tablet Take 4 tablets (80 mg total) by mouth daily as needed. 60 tablet 11   No facility-administered medications prior to visit.    ROS Review of Systems  Constitutional:  Negative for chills, diaphoresis, fatigue and fever.  HENT: Negative.    Eyes: Negative.    Respiratory:  Negative for cough, chest tightness, shortness of breath and wheezing.   Cardiovascular:  Negative for chest pain, palpitations and leg swelling.  Gastrointestinal:  Negative for abdominal pain, constipation, diarrhea, nausea and vomiting.  Endocrine: Negative.   Genitourinary: Negative.  Negative for difficulty urinating, dysuria, scrotal swelling and testicular pain.  Musculoskeletal:  Positive for arthralgias. Negative for back pain and myalgias.       Right foot pain  Skin:  Negative for pallor and rash.  Neurological: Negative.  Negative for dizziness, weakness, light-headedness, numbness and headaches.  Hematological:  Negative for adenopathy. Does not bruise/bleed easily.  Psychiatric/Behavioral: Negative.     Objective:  BP (!) 142/94 (BP Location: Right Arm, Patient Position: Sitting, Cuff Size: Large)   Pulse 61   Temp 98.2 F (36.8 C) (Oral)   Resp 16   Ht 5' 9"  (1.753 m)   Wt 173 lb (78.5 kg)   SpO2 97%   BMI 25.55 kg/m   BP Readings from Last 3 Encounters:  12/24/20 (!) 142/94  08/08/20 132/82  12/19/19 136/84    Wt Readings from Last 3 Encounters:  12/24/20 173 lb (78.5 kg)  08/08/20 178 lb (80.7 kg)  12/19/19 171 lb (77.6 kg)    Physical Exam Vitals reviewed. Exam conducted with a chaperone present.  HENT:     Nose: Nose normal.     Mouth/Throat:     Mouth: Mucous membranes are moist.  Eyes:     General: No scleral icterus.    Conjunctiva/sclera: Conjunctivae normal.  Cardiovascular:     Rate and Rhythm: Normal rate and regular rhythm.     Heart sounds: No murmur heard. Pulmonary:     Effort: Pulmonary effort is normal.     Breath sounds: No stridor. No wheezing, rhonchi or rales.  Abdominal:     General: Abdomen is flat.     Palpations: There is no mass.     Tenderness: There is no abdominal tenderness. There is no guarding.     Hernia: No hernia is present. There is no hernia in the left inguinal area or right inguinal area.   Genitourinary:    Pubic Area: No rash.      Penis: Normal and circumcised.      Testes: Normal.     Epididymis:     Right: Normal. Not inflamed or enlarged. No mass.     Left: Normal. Not inflamed or enlarged. No mass.     Prostate: Enlarged. Not tender and no nodules present.     Rectum: Guaiac result negative. External hemorrhoid and internal hemorrhoid present. No mass, tenderness or anal fissure. Normal anal tone.  Musculoskeletal:        General: Normal range of motion.     Cervical back: Neck supple.     Right lower leg: No edema.     Left lower leg: No edema.     Right foot: Bunion present.     Left foot: Bunion present.  Lymphadenopathy:     Cervical: No cervical adenopathy.     Lower Body: No right inguinal adenopathy. No left inguinal adenopathy.  Skin:    General: Skin is warm and dry.  Neurological:     General: No focal deficit present.     Mental Status: He is alert.  Psychiatric:        Mood and Affect: Mood normal.        Behavior: Behavior normal.    Lab Results  Component Value Date   WBC 8.2 12/24/2020   HGB 13.0 12/24/2020   HCT 38.8 (L) 12/24/2020   PLT 208.0 12/24/2020   GLUCOSE 96 08/08/2020   CHOL 110 12/24/2020   TRIG 136.0 12/24/2020   HDL 39.40 12/24/2020   LDLCALC 44 12/24/2020   ALT 17 12/24/2020   AST 17 12/24/2020   NA 142 10/04/2020   K 5.4 (A) 10/04/2020   CL 106 10/04/2020   CREATININE 3.2 (A) 10/04/2020   BUN 43 (A) 10/04/2020   CO2 22 10/04/2020   TSH 2.20 12/24/2020   PSA 3.74 12/24/2020   INR 0.9 12/19/2019   HGBA1C 6.2 12/24/2020   MICROALBUR 81.6 10/04/2020    US Abdomen Limited RUQ  Result Date: 01/25/2019 CLINICAL DATA:  70 year old male with elevated LFTs. EXAM: ULTRASOUND ABDOMEN LIMITED RIGHT UPPER QUADRANT COMPARISON:  None. FINDINGS: Gallbladder: No gallstones or wall thickening visualized. No sonographic Murphy sign noted by sonographer. Common bile duct: Diameter: 5 mm Liver: There is diffuse increased liver  echogenicity most commonly seen in the setting of fatty infiltration. Superimposed inflammation or fibrosis is not excluded. Clinical correlation is recommended. Portal vein is patent on color Doppler imaging with normal direction of blood flow towards the liver. Other: None. IMPRESSION: Fatty liver, otherwise unremarkable right upper quadrant ultrasound. Electronically Signed   By: Anner Crete M.D.   On: 01/25/2019 10:20    Assessment & Plan:   Yuto was seen today for annual exam, hypertension,  hyperlipidemia and diabetes.  Diagnoses and all orders for this visit:  Mild intermittent asthma without complication- Well controlled. -     CBC with Differential/Platelet; Future -     CBC with Differential/Platelet  Type II diabetes mellitus with manifestations ([place])- His blood sugar is adequately well controlled. -     Azilsartan-Chlorthalidone (EDARBYCLOR) 40-12.5 MG TABS; TAKE ONE TABLET BY MOUTH ONCE DAILY -     pioglitazone (ACTOS) 15 MG tablet; Take 1 tablet (15 mg total) by mouth daily. -     Cancel: Basic metabolic panel; Future -     Hemoglobin A1c; Future -     HM Diabetes Foot Exam -     Hemoglobin A1c  Essential hypertension, benign- His blood pressure is adequately well controlled. -     Azilsartan-Chlorthalidone (EDARBYCLOR) 40-12.5 MG TABS; TAKE ONE TABLET BY MOUTH ONCE DAILY -     CBC with Differential/Platelet; Future -     Cancel: Basic metabolic panel; Future -     TSH; Future -     Cancel: Urinalysis, Routine w reflex microscopic; Future -     TSH -     CBC with Differential/Platelet  NASH (nonalcoholic steatohepatitis)- Improvement noted. -     pioglitazone (ACTOS) 15 MG tablet; Take 1 tablet (15 mg total) by mouth daily. -     Hepatic function panel; Future -     Hepatic function panel  Stage 3a chronic kidney disease ([place])- Will discontinue metformin and Farxiga. -     Cancel: Basic metabolic panel; Future -     Cancel: Urinalysis, Routine w reflex  microscopic; Future  Benign prostatic hyperplasia without lower urinary tract symptoms- His PSA is normal. -     PSA; Future -     PSA  Routine general medical examination at a health care facility- Exam completed, labs reviewed, vaccines reviewed and updated, cancer screenings are up-to-date, patient education was given.  Hyperlipidemia with target LDL less than 130- LDL goal achieved. Doing well on the statin  -     Lipid panel; Future -     TSH; Future -     TSH -     Lipid panel  Flu vaccine need -     Flu Vaccine QUAD High Dose(Fluad)  Bunion of great toe of right foot -     Ambulatory referral to Podiatry  I have discontinued Jhamal L. Warshaw "Don"'s sildenafil, dapagliflozin propanediol, and metformin. I have also changed his pioglitazone. Additionally, I am having him maintain his aspirin EC, Breo Ellipta, dorzolamide-timolol, pantoprazole, rosuvastatin, and Edarbyclor.  Meds ordered this encounter  Medications   Azilsartan-Chlorthalidone (EDARBYCLOR) 40-12.5 MG TABS    Sig: TAKE ONE TABLET BY MOUTH ONCE DAILY    Dispense:  90 tablet    Refill:  0   pioglitazone (ACTOS) 15 MG tablet    Sig: Take 1 tablet (15 mg total) by mouth daily.    Dispense:  90 tablet    Refill:  0     Follow-up: Return in about 6 months (around 06/23/2021).  Scarlette Calico, MD

## 2020-12-24 NOTE — Patient Instructions (Signed)

## 2021-01-06 ENCOUNTER — Ambulatory Visit (INDEPENDENT_AMBULATORY_CARE_PROVIDER_SITE_OTHER): Payer: Commercial Managed Care - PPO

## 2021-01-06 ENCOUNTER — Other Ambulatory Visit: Payer: Self-pay

## 2021-01-06 ENCOUNTER — Ambulatory Visit: Payer: Commercial Managed Care - PPO | Admitting: Podiatry

## 2021-01-06 ENCOUNTER — Encounter: Payer: Self-pay | Admitting: Podiatry

## 2021-01-06 DIAGNOSIS — M2061 Acquired deformities of toe(s), unspecified, right foot: Secondary | ICD-10-CM

## 2021-01-06 DIAGNOSIS — M1A3710 Chronic gout due to renal impairment, right ankle and foot, without tophus (tophi): Secondary | ICD-10-CM

## 2021-01-06 DIAGNOSIS — N1831 Chronic kidney disease, stage 3a: Secondary | ICD-10-CM

## 2021-01-06 MED ORDER — BETAMETHASONE SOD PHOS & ACET 6 (3-3) MG/ML IJ SUSP
3.0000 mg | Freq: Once | INTRAMUSCULAR | Status: AC
  Administered 2021-01-06: 3 mg

## 2021-01-06 NOTE — Progress Notes (Signed)
  Subjective:  Patient ID: Brett Robles, male    DOB: 02/13/1950,  MRN: [id number]  Chief Complaint  Patient presents with   Foot Pain    I have some inflammation in the right big toe and has been going on for about 6 months    70 y.o. male presents with the above complaint. History confirmed with patient. Endorses hx of renal disease. States he has about 3 gout flare ups a year - worst in the summer than in the winter. Has been placed on dietary restrictions by his doctor for both CKD and gout. Does not appear to have a uric acid level checked recently.  Objective:  Physical Exam: warm, good capillary refill, no trophic changes or ulcerative lesions, normal DP and PT pulses, and normal sensory exam. Left Foot: mild HAV deformity without POP  Right Foot: moderate HAV deformity with local edema. No warmth noted. No tophus deposit noted.    No images are attached to the encounter.  Radiographs: X-ray of the right foot: Hallux valgus deformity, increased ST density and volume. Medial 1st MPJ erosions. Assessment:   1. Acquired deformity of right toe   2. 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 Stage 3a chronic kidney disease (Brett Robles)    Plan:  Patient was evaluated and treated and all questions answered.  Gout Right foot 2/2 renal insufficiency -Educated on etiology -XR reviewed with patient -Check UA -Injection delivered to the painful joint  Procedure: Joint Injection Location: Right 1st MP joint Skin Prep: Alcohol. Injectate: 0.5 cc 1% lidocaine plain, 0.5 cc betamethasone acetate-betamethasone sodium phosphate Disposition: Patient tolerated procedure well. Injection site dressed with a band-aid.  Return in about 4 weeks (around 02/03/2021) for Gout f/u.

## 2021-01-07 LAB — URIC ACID: Uric Acid: 8.5 mg/dL — ABNORMAL HIGH (ref 3.8–8.4)

## 2021-01-10 ENCOUNTER — Other Ambulatory Visit: Payer: Self-pay | Admitting: Internal Medicine

## 2021-01-10 ENCOUNTER — Encounter: Payer: Self-pay | Admitting: Internal Medicine

## 2021-01-10 DIAGNOSIS — U071 COVID-19: Secondary | ICD-10-CM | POA: Insufficient documentation

## 2021-01-10 MED ORDER — MOLNUPIRAVIR EUA 200MG CAPSULE
4.0000 | ORAL_CAPSULE | Freq: Two times a day (BID) | ORAL | 0 refills | Status: AC
Start: 2021-01-10 — End: 2021-01-15

## 2021-01-15 ENCOUNTER — Encounter: Payer: Self-pay | Admitting: Internal Medicine

## 2021-01-15 ENCOUNTER — Encounter: Payer: Self-pay | Admitting: Podiatry

## 2021-01-16 ENCOUNTER — Other Ambulatory Visit: Payer: Self-pay | Admitting: Internal Medicine

## 2021-01-16 DIAGNOSIS — M10271 Drug-induced gout, right ankle and foot: Secondary | ICD-10-CM | POA: Insufficient documentation

## 2021-01-16 MED ORDER — METHYLPREDNISOLONE 4 MG PO TBPK
ORAL_TABLET | ORAL | 0 refills | Status: AC
Start: 2021-01-16 — End: 2021-01-22

## 2021-01-16 MED ORDER — INDOMETHACIN 25 MG PO CAPS: 25.0000 mg | ORAL_CAPSULE | Freq: Three times a day (TID) | ORAL | 0 refills | Status: AC

## 2021-02-03 NOTE — Telephone Encounter (Signed)
Please advise 

## 2021-02-09 ENCOUNTER — Other Ambulatory Visit: Payer: Self-pay | Admitting: Internal Medicine

## 2021-02-09 DIAGNOSIS — T39395A Adverse effect of other nonsteroidal anti-inflammatory drugs [NSAID], initial encounter: Secondary | ICD-10-CM

## 2021-02-09 DIAGNOSIS — K269 Duodenal ulcer, unspecified as acute or chronic, without hemorrhage or perforation: Secondary | ICD-10-CM

## 2021-02-10 ENCOUNTER — Ambulatory Visit: Payer: Commercial Managed Care - PPO | Admitting: Podiatry

## 2021-02-13 ENCOUNTER — Ambulatory Visit (INDEPENDENT_AMBULATORY_CARE_PROVIDER_SITE_OTHER): Payer: Commercial Managed Care - PPO | Admitting: Podiatry

## 2021-02-13 ENCOUNTER — Other Ambulatory Visit: Payer: Self-pay

## 2021-02-13 DIAGNOSIS — M7751 Other enthesopathy of right foot: Secondary | ICD-10-CM | POA: Diagnosis not present

## 2021-02-13 MED ORDER — BETAMETHASONE SOD PHOS & ACET 6 (3-3) MG/ML IJ SUSP
3.0000 mg | Freq: Once | INTRAMUSCULAR | Status: AC
  Administered 2021-02-13: 3 mg

## 2021-02-13 NOTE — Progress Notes (Signed)
°  Subjective:  Patient ID: Brett Robles, male    DOB: 09/14/1950,  MRN: [id number]  Chief Complaint  Patient presents with   Gout     F/U Rt gout -pt states," flared up started back again at my Rt." X new years eve 0-w/ redness,swelling and pain -Tx: OTC medication    71 y.o. male presents with the above complaint. History confirmed with patient. Has been taking an OTC supplement that has tart cherry juice. Thinks that this is helping. Injection helped for about a week and was pleased with results. Pain rated 5/10 today.  Objective:  Physical Exam: warm, good capillary refill, no trophic changes or ulcerative lesions, normal DP and PT pulses, and normal sensory exam. Left Foot: mild HAV deformity without POP  Right Foot: moderate HAV deformity with local edema and POP. No warmth noted. No tophus deposit noted.    Assessment:   1. Capsulitis of metatarsophalangeal (MTP) joint of right foot    Plan:  Patient was evaluated and treated and all questions answered.  Gout Right foot 2/2 renal insufficiency -UA mildly elevated -Continue dietary supplements if these help -Would recc against indocin unless absolutely necessary given renal dz -Repeat injection right 1st MPJ (Injection #2)  Procedure: Joint Injection Location: Right 1st MP joint Skin Prep: Alcohol. Injectate: 0.5 cc 1% lidocaine plain, 0.5 cc betamethasone acetate-betamethasone sodium phosphate Disposition: Patient tolerated procedure well. Injection site dressed with a band-aid.   Return in about 6 weeks (around 03/27/2021) for Gout f/u right.

## 2021-02-20 ENCOUNTER — Other Ambulatory Visit: Payer: Self-pay | Admitting: Internal Medicine

## 2021-02-20 DIAGNOSIS — E118 Type 2 diabetes mellitus with unspecified complications: Secondary | ICD-10-CM

## 2021-02-21 ENCOUNTER — Other Ambulatory Visit: Payer: Self-pay | Admitting: Internal Medicine

## 2021-02-21 ENCOUNTER — Encounter: Payer: Self-pay | Admitting: Internal Medicine

## 2021-03-12 ENCOUNTER — Encounter: Payer: Self-pay | Admitting: Internal Medicine

## 2021-03-20 ENCOUNTER — Other Ambulatory Visit: Payer: Self-pay | Admitting: Internal Medicine

## 2021-03-20 DIAGNOSIS — I10 Essential (primary) hypertension: Secondary | ICD-10-CM

## 2021-03-20 DIAGNOSIS — E118 Type 2 diabetes mellitus with unspecified complications: Secondary | ICD-10-CM

## 2021-03-20 MED ORDER — OLMESARTAN MEDOXOMIL 40 MG PO TABS: 40.0000 mg | ORAL_TABLET | Freq: Every day | ORAL | 1 refills | Status: DC

## 2021-03-20 MED ORDER — EDARBYCLOR 40-12.5 MG PO TABS: ORAL_TABLET | ORAL | 0 refills | Status: DC

## 2021-03-20 MED ORDER — INDAPAMIDE 1.25 MG PO TABS: 1.2500 mg | ORAL_TABLET | Freq: Every day | ORAL | 1 refills | Status: DC

## 2021-03-24 ENCOUNTER — Encounter: Payer: Self-pay | Admitting: Internal Medicine

## 2021-03-29 ENCOUNTER — Other Ambulatory Visit: Payer: Self-pay | Admitting: Internal Medicine

## 2021-03-29 DIAGNOSIS — K7581 Nonalcoholic steatohepatitis (NASH): Secondary | ICD-10-CM

## 2021-03-29 DIAGNOSIS — E118 Type 2 diabetes mellitus with unspecified complications: Secondary | ICD-10-CM

## 2021-03-31 ENCOUNTER — Encounter: Payer: Self-pay | Admitting: Podiatry

## 2021-03-31 ENCOUNTER — Ambulatory Visit: Payer: Commercial Managed Care - PPO | Admitting: Podiatry

## 2021-03-31 DIAGNOSIS — M7751 Other enthesopathy of right foot: Secondary | ICD-10-CM

## 2021-03-31 DIAGNOSIS — M1A3710 Chronic gout due to renal impairment, right ankle and foot, without tophus (tophi): Secondary | ICD-10-CM

## 2021-03-31 MED ORDER — PIOGLITAZONE HCL 15 MG PO TABS: 15.0000 mg | ORAL_TABLET | Freq: Every day | ORAL | 0 refills | Status: DC

## 2021-03-31 MED ORDER — METHYLPREDNISOLONE 4 MG PO TBPK: ORAL_TABLET | ORAL | 0 refills | Status: DC

## 2021-03-31 NOTE — Progress Notes (Signed)
?  Subjective:  ?Patient ID: Brett Robles, male    DOB: 01/18/1951,  MRN: [id number] ? ?Chief Complaint  ?Patient presents with  ? Foot Pain  ?  The right foot had the shot and only lasted a few days and seems to come and go in both feet   ? ?71 y.o. male presents for follow-up of gout pain. Patient relates today he is not having a flare but has had one every few weeks. Is going on a trip to [place] and worried he will have a flare there. Only thing that has been helpful is medrol dose pack. Denies any other complaints.  ? ?Objective:  ?Physical Exam: ?warm, good capillary refill, no trophic changes or ulcerative lesions, normal DP and PT pulses, and normal sensory exam. ?Left Foot: mild HAV deformity without POP  ?Right Foot: moderate HAV deformity with local edema and POP. No warmth noted. No tophus deposit noted.    ?Assessment:  ? ?1. 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 Capsulitis of metatarsophalangeal (MTP) joint of right foot   ? ? ?Plan:  ?Patient was evaluated and treated and all questions answered. 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 ?-Discussed diet and modifications.  ?-Medrol dose pack provided for trip in case he has a flare.  ?-Discussed voltaren gel for regular arthritis as well as padding to offload area.  ?-Discussed following up with PCP for long term gout medications  ?-Advised patient to call if symptoms are not improved within 1 week ?-Patient to return as needed.  ? ? ? ?No follow-ups on file.  ? ? ?

## 2021-05-11 ENCOUNTER — Other Ambulatory Visit: Payer: Self-pay | Admitting: Internal Medicine

## 2021-05-11 DIAGNOSIS — E785 Hyperlipidemia, unspecified: Secondary | ICD-10-CM

## 2021-08-10 ENCOUNTER — Other Ambulatory Visit: Payer: Self-pay | Admitting: Internal Medicine

## 2021-08-10 DIAGNOSIS — K269 Duodenal ulcer, unspecified as acute or chronic, without hemorrhage or perforation: Secondary | ICD-10-CM

## 2021-09-21 ENCOUNTER — Other Ambulatory Visit: Payer: Self-pay | Admitting: Internal Medicine

## 2021-09-21 DIAGNOSIS — I10 Essential (primary) hypertension: Secondary | ICD-10-CM

## 2021-09-22 ENCOUNTER — Telehealth: Payer: Self-pay | Admitting: Internal Medicine

## 2021-09-22 DIAGNOSIS — I10 Essential (primary) hypertension: Secondary | ICD-10-CM

## 2021-09-22 MED ORDER — INDAPAMIDE 1.25 MG PO TABS: 1.2500 mg | ORAL_TABLET | Freq: Every day | ORAL | 0 refills | Status: DC

## 2021-09-22 MED ORDER — OLMESARTAN MEDOXOMIL 40 MG PO TABS: 40.0000 mg | ORAL_TABLET | Freq: Every day | ORAL | 0 refills | Status: DC

## 2021-09-22 NOTE — Telephone Encounter (Signed)
Partial refills sent in PCPs absence.

## 2021-09-22 NOTE — Telephone Encounter (Signed)
PT visits today in need of a refill on two medications. PT was informed by their pharmacy that the script for Olmesartan (BENICAR) 40 MG and Indapamide (LOZOL) 1.25 MG would not be filled until PT had seen Dr.Jones in office again.  PT would like to know if these scripts could be partially filled until his next appointment on 09/11?  CB: [phone]

## 2021-09-23 ENCOUNTER — Other Ambulatory Visit: Payer: Self-pay | Admitting: Internal Medicine

## 2021-09-23 DIAGNOSIS — E118 Type 2 diabetes mellitus with unspecified complications: Secondary | ICD-10-CM

## 2021-09-23 DIAGNOSIS — K7581 Nonalcoholic steatohepatitis (NASH): Secondary | ICD-10-CM

## 2021-10-06 ENCOUNTER — Encounter: Payer: Self-pay | Admitting: Internal Medicine

## 2021-10-06 ENCOUNTER — Ambulatory Visit: Payer: Commercial Managed Care - PPO | Admitting: Internal Medicine

## 2021-10-06 VITALS — BP 124/76 | HR 81 | Temp 98.4°F | Resp 16 | Ht 69.0 in | Wt 177.0 lb

## 2021-10-06 DIAGNOSIS — I10 Essential (primary) hypertension: Secondary | ICD-10-CM | POA: Diagnosis not present

## 2021-10-06 DIAGNOSIS — E118 Type 2 diabetes mellitus with unspecified complications: Secondary | ICD-10-CM | POA: Diagnosis not present

## 2021-10-06 DIAGNOSIS — Z23 Encounter for immunization: Secondary | ICD-10-CM

## 2021-10-06 DIAGNOSIS — N1831 Chronic kidney disease, stage 3a: Secondary | ICD-10-CM

## 2021-10-06 LAB — CBC WITH DIFFERENTIAL/PLATELET
Basophils Absolute: 0.1 10*3/uL (ref 0.0–0.1)
Basophils Relative: 0.6 % (ref 0.0–3.0)
Eosinophils Absolute: 0.3 10*3/uL (ref 0.0–0.7)
Eosinophils Relative: 3.3 % (ref 0.0–5.0)
HCT: 40.7 % (ref 39.0–52.0)
Hemoglobin: 13.8 g/dL (ref 13.0–17.0)
Lymphocytes Relative: 36.4 % (ref 12.0–46.0)
Lymphs Abs: 2.9 10*3/uL (ref 0.7–4.0)
MCHC: 33.9 g/dL (ref 30.0–36.0)
MCV: 92.3 fl (ref 78.0–100.0)
Monocytes Absolute: 0.7 10*3/uL (ref 0.1–1.0)
Monocytes Relative: 9.2 % (ref 3.0–12.0)
Neutro Abs: 4 10*3/uL (ref 1.4–7.7)
Neutrophils Relative %: 50.5 % (ref 43.0–77.0)
Platelets: 194 10*3/uL (ref 150.0–400.0)
RBC: 4.41 Mil/uL (ref 4.22–5.81)
RDW: 13 % (ref 11.5–15.5)
WBC: 8 10*3/uL (ref 4.0–10.5)

## 2021-10-06 LAB — BASIC METABOLIC PANEL
BUN: 26 mg/dL — ABNORMAL HIGH (ref 6–23)
CO2: 30 mEq/L (ref 19–32)
Calcium: 9.7 mg/dL (ref 8.4–10.5)
Chloride: 105 mEq/L (ref 96–112)
Creatinine, Ser: 1.61 mg/dL — ABNORMAL HIGH (ref 0.40–1.50)
GFR: 42.17 mL/min — ABNORMAL LOW (ref >60.00)
Glucose, Bld: 104 mg/dL — ABNORMAL HIGH (ref 70–99)
Potassium: 3.9 mEq/L (ref 3.5–5.1)
Sodium: 141 mEq/L (ref 135–145)

## 2021-10-06 NOTE — Patient Instructions (Signed)
                                                                    [email or web address]

## 2021-10-06 NOTE — Progress Notes (Signed)
Subjective:  Patient ID: Brett Robles, male    DOB: 07/29/1950  Age: 71 y.o. MRN: [id number]  CC: Hypertension and Diabetes   HPI Brett Robles presents for f/up -  He swims and uses a push mower and does not experience chest pain, shortness of breath, diaphoresis, or edema.  He does complain of intermittent dizziness and lightheadedness.  Outpatient Medications Prior to Visit  Medication Sig Dispense Refill   aspirin EC 81 MG tablet Take 1 tablet (81 mg total) by mouth daily. 90 tablet 1   BREO ELLIPTA 100-25 MCG/INH AEPB TAKE 1 PUFF BY MOUTH EVERY DAY 60 each 3   chlorhexidine (PERIDEX) 0.12 % solution SMARTSIG:By Mouth     dorzolamide-timolol (COSOPT) 22.3-6.8 MG/ML ophthalmic solution Place 1 drop into the right eye 2 (two) times daily.     pantoprazole (PROTONIX) 40 MG tablet TAKE 1 TABLET BY MOUTH EVERY DAY 90 tablet 1   rosuvastatin (CRESTOR) 10 MG tablet TAKE 1 TABLET BY MOUTH EVERY DAY 90 tablet 1   indapamide (LOZOL) 1.25 MG tablet Take 1 tablet (1.25 mg total) by mouth daily. APPOINTMENT NEEDED FOR ADDITIONAL REFILLS 15 tablet 0   LAGEVRIO 200 MG CAPS capsule Take 4 capsules by mouth 2 (two) times daily.     losartan (COZAAR) 25 MG tablet Take 25 mg by mouth daily.     methylPREDNISolone (MEDROL DOSEPAK) 4 MG TBPK tablet Take as directed 21 tablet 0   olmesartan (BENICAR) 40 MG tablet Take 1 tablet (40 mg total) by mouth daily. APPOINTMENT NEEDED FOR ADDITIONAL REFILLS 15 tablet 0   pioglitazone (ACTOS) 15 MG tablet Take 1 tablet (15 mg total) by mouth daily. 90 tablet 0   No facility-administered medications prior to visit.    ROS Review of Systems  Constitutional:  Negative for chills, diaphoresis, fatigue and fever.  HENT: Negative.    Eyes: Negative.   Respiratory:  Negative for cough, chest tightness and shortness of breath.   Cardiovascular:  Negative for chest pain, palpitations and leg swelling.  Gastrointestinal:  Negative for abdominal pain, diarrhea and  nausea.  Endocrine: Negative.   Genitourinary: Negative.  Negative for difficulty urinating.  Musculoskeletal: Negative.  Negative for arthralgias and myalgias.  Skin: Negative.   Neurological:  Negative for dizziness, weakness, light-headedness and numbness.  Hematological:  Negative for adenopathy. Does not bruise/bleed easily.  Psychiatric/Behavioral: Negative.      Objective:  BP 124/76 (BP Location: Left Arm, Patient Position: Sitting, Cuff Size: Large)   Pulse 81   Temp 98.4 F (36.9 C) (Oral)   Resp 16   Ht 5' 9"  (1.753 m)   Wt 177 lb (80.3 kg)   SpO2 92%   BMI 26.14 kg/m   BP Readings from Last 3 Encounters:  10/06/21 124/76  12/24/20 (!) 142/94  08/08/20 132/82    Wt Readings from Last 3 Encounters:  10/06/21 177 lb (80.3 kg)  12/24/20 173 lb (78.5 kg)  08/08/20 178 lb (80.7 kg)    Physical Exam Vitals reviewed.  HENT:     Nose: Nose normal.     Mouth/Throat:     Mouth: Mucous membranes are moist.  Eyes:     General: No scleral icterus.    Conjunctiva/sclera: Conjunctivae normal.  Cardiovascular:     Rate and Rhythm: Normal rate and regular rhythm.     Heart sounds: No murmur heard. Pulmonary:     Effort: Pulmonary effort is normal.     Breath sounds: No stridor. No  wheezing, rhonchi or rales.  Abdominal:     General: Abdomen is flat.     Palpations: There is no mass.     Tenderness: There is no abdominal tenderness. There is no guarding.     Hernia: No hernia is present.  Musculoskeletal:        General: Normal range of motion.     Cervical back: Neck supple.     Right lower leg: No edema.     Left lower leg: No edema.  Lymphadenopathy:     Cervical: No cervical adenopathy.  Skin:    General: Skin is warm and dry.  Neurological:     General: No focal deficit present.     Mental Status: He is alert.  Psychiatric:        Mood and Affect: Mood normal.        Behavior: Behavior normal.     Lab Results  Component Value Date   WBC 8.0  10/06/2021   HGB 13.8 10/06/2021   HCT 40.7 10/06/2021   PLT 194.0 10/06/2021   GLUCOSE 104 (H) 10/06/2021   CHOL 110 12/24/2020   TRIG 136.0 12/24/2020   HDL 39.40 12/24/2020   LDLCALC 44 12/24/2020   ALT 17 12/24/2020   AST 17 12/24/2020   NA 141 10/06/2021   K 3.9 10/06/2021   CL 105 10/06/2021   CREATININE 1.61 (H) 10/06/2021   BUN 26 (H) 10/06/2021   CO2 30 10/06/2021   TSH 2.20 12/24/2020   PSA 3.74 12/24/2020   INR 0.9 12/19/2019   HGBA1C 6.4 10/06/2021   MICROALBUR <0.7 10/06/2021    US Abdomen Limited RUQ  Result Date: 01/25/2019 CLINICAL DATA:  71 year old male with elevated LFTs. EXAM: ULTRASOUND ABDOMEN LIMITED RIGHT UPPER QUADRANT COMPARISON:  None. FINDINGS: Gallbladder: No gallstones or wall thickening visualized. No sonographic Murphy sign noted by sonographer. Common bile duct: Diameter: 5 mm Liver: There is diffuse increased liver echogenicity most commonly seen in the setting of fatty infiltration. Superimposed inflammation or fibrosis is not excluded. Clinical correlation is recommended. Portal vein is patent on color Doppler imaging with normal direction of blood flow towards the liver. Other: None. IMPRESSION: Fatty liver, otherwise unremarkable right upper quadrant ultrasound. Electronically Signed   By: Anner Crete M.D.   On: 01/25/2019 10:20    Assessment & Plan:   Brett Robles was seen today for hypertension and diabetes.  Diagnoses and all orders for this visit:  Type II diabetes mellitus with manifestations ([place])- His blood sugar is adequately well controlled. -     Basic metabolic panel; Future -     Microalbumin / creatinine urine ratio; Future -     Hemoglobin A1c; Future -     Hemoglobin A1c -     Microalbumin / creatinine urine ratio -     Basic metabolic panel -     dapagliflozin propanediol (FARXIGA) 10 MG TABS tablet; Take 1 tablet (10 mg total) by mouth daily before breakfast.  Essential hypertension, benign- His blood pressure is  somewhat overcontrolled.  Will discontinue the thiazide diuretic. -     Basic metabolic panel; Future -     CBC with Differential/Platelet; Future -     Urinalysis, Routine w reflex microscopic; Future -     Urinalysis, Routine w reflex microscopic -     CBC with Differential/Platelet -     Basic metabolic panel -     olmesartan (BENICAR) 40 MG tablet; Take 1 tablet (40 mg total) by mouth daily.  Stage 3a chronic kidney disease ([place])- I recommended that he start taking an SGLT2 inhibitor for renal protection. -     Basic metabolic panel; Future -     Microalbumin / creatinine urine ratio; Future -     Urinalysis, Routine w reflex microscopic; Future -     Urinalysis, Routine w reflex microscopic -     Microalbumin / creatinine urine ratio -     Basic metabolic panel -     dapagliflozin propanediol (FARXIGA) 10 MG TABS tablet; Take 1 tablet (10 mg total) by mouth daily before breakfast.  Flu vaccine need -     Flu Vaccine QUAD High Dose(Fluad)   I have discontinued Chong L. Richer "Brett Robles"'s pioglitazone, losartan, Lagevrio, methylPREDNISolone, and indapamide. I have also changed his olmesartan. Additionally, I am having him start on dapagliflozin propanediol. Lastly, I am having him maintain his aspirin EC, Breo Ellipta, dorzolamide-timolol, chlorhexidine, rosuvastatin, and pantoprazole.  Meds ordered this encounter  Medications   olmesartan (BENICAR) 40 MG tablet    Sig: Take 1 tablet (40 mg total) by mouth daily.    Dispense:  90 tablet    Refill:  1   dapagliflozin propanediol (FARXIGA) 10 MG TABS tablet    Sig: Take 1 tablet (10 mg total) by mouth daily before breakfast.    Dispense:  90 tablet    Refill:  1     Follow-up: Return in about 4 months (around 02/05/2022).  Scarlette Calico, MD

## 2021-10-07 ENCOUNTER — Other Ambulatory Visit: Payer: Self-pay | Admitting: Internal Medicine

## 2021-10-07 DIAGNOSIS — K7581 Nonalcoholic steatohepatitis (NASH): Secondary | ICD-10-CM

## 2021-10-07 DIAGNOSIS — E118 Type 2 diabetes mellitus with unspecified complications: Secondary | ICD-10-CM

## 2021-10-07 DIAGNOSIS — I10 Essential (primary) hypertension: Secondary | ICD-10-CM

## 2021-10-07 LAB — URINALYSIS, ROUTINE W REFLEX MICROSCOPIC
Bilirubin Urine: NEGATIVE
Hgb urine dipstick: NEGATIVE
Ketones, ur: NEGATIVE
Leukocytes,Ua: NEGATIVE
Nitrite: NEGATIVE
RBC / HPF: NONE SEEN (ref 0–?)
Specific Gravity, Urine: 1.02 (ref 1.000–1.030)
Total Protein, Urine: NEGATIVE
Urine Glucose: NEGATIVE
Urobilinogen, UA: 0.2 (ref 0.0–1.0)
pH: 6 (ref 5.0–8.0)

## 2021-10-07 LAB — MICROALBUMIN / CREATININE URINE RATIO
Creatinine,U: 113.9 mg/dL
Microalb Creat Ratio: 0.6 mg/g (ref 0.0–30.0)
Microalb, Ur: <0.7 mg/dL (ref 0.0–1.9)

## 2021-10-07 LAB — HEMOGLOBIN A1C: Hgb A1c MFr Bld: 6.4 % (ref 4.6–6.5)

## 2021-10-08 MED ORDER — OLMESARTAN MEDOXOMIL 40 MG PO TABS: 40.0000 mg | ORAL_TABLET | Freq: Every day | ORAL | 1 refills | Status: DC

## 2021-10-09 MED ORDER — DAPAGLIFLOZIN PROPANEDIOL 10 MG PO TABS: 10.0000 mg | ORAL_TABLET | Freq: Every day | ORAL | 1 refills | Status: DC

## 2021-11-12 ENCOUNTER — Other Ambulatory Visit: Payer: Self-pay | Admitting: Internal Medicine

## 2021-11-12 DIAGNOSIS — E785 Hyperlipidemia, unspecified: Secondary | ICD-10-CM

## 2021-11-13 LAB — HM DIABETES EYE EXAM

## 2021-12-15 ENCOUNTER — Ambulatory Visit: Payer: Commercial Managed Care - PPO | Admitting: Podiatry

## 2021-12-15 DIAGNOSIS — M1A3710 Chronic gout due to renal impairment, right ankle and foot, without tophus (tophi): Secondary | ICD-10-CM

## 2021-12-15 DIAGNOSIS — Q828 Other specified congenital malformations of skin: Secondary | ICD-10-CM | POA: Diagnosis not present

## 2021-12-15 DIAGNOSIS — M7751 Other enthesopathy of right foot: Secondary | ICD-10-CM

## 2021-12-15 MED ORDER — METHYLPREDNISOLONE 4 MG PO TBPK: ORAL_TABLET | ORAL | 0 refills | Status: DC

## 2021-12-15 MED ORDER — COLCHICINE 0.6 MG PO TABS: 0.6000 mg | ORAL_TABLET | Freq: Every day | ORAL | 0 refills | Status: DC

## 2021-12-15 NOTE — Progress Notes (Signed)
  Subjective:  Patient ID: Brett Robles, male    DOB: Sep 04, 1950,  MRN: [id number]  Chief Complaint  Patient presents with   Gout    Gout- right and left foot depending on the day. Patient stated he had relief with the medrol pack that was prescribed on March 2023.    71 y.o. male presents for follow-up of gout pain. Patient relates today he is not having a flare but has them occasionally.  He is going on an upcoming trip to [place] and will be on a safari there.  He is asking for medications in case his gout flareup becomes worse while on the trip.  He has made shoe gear modifications and does use a over-the-counter herbal gout remedy when he has pain.  He also notes that he has been having some pain with calluses that gets worse when he has a flareup and swelling in his foot at the outside of the fifth toe joint bilaterally.  Objective:  Physical Exam: warm, good capillary refill, no trophic changes or ulcerative lesions, normal DP and PT pulses, and normal sensory exam. Left Foot: mild HAV deformity without POP.  Hyperkeratotic lesion with central hyperkeratotic core consistent with porokeratosis with pain on palpation of the plantar aspect of the fifth metatarsal head Right Foot: moderate HAV deformity with local edema and POP. Hyperkeratotic lesion with central hyperkeratotic core consistent with porokeratosis with pain on palpation of the plantar aspect of the fifth metatarsal headNo warmth noted. No tophus deposit noted.    Assessment:   1. 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 Porokeratosis   3. Capsulitis of metatarsophalangeal (MTP) joint of right foot      Plan:  Patient was evaluated and treated and all questions answered.  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 -Discussed diet and modifications.  -Medrol dose  methylprednisolone 4 mg take as directed for 6 days pack provided for trip in case he has a flare.  -Colchicine 0.6 mg take once daily as needed for gout flare prescribed as well.  Patient advised to take either the colchicine or the Medrol Dosepak. -Discussed voltaren gel for regular arthritis as well as padding to offload area.  -Discussed following up with PCP for long term gout medications  -Advised patient to call if symptoms are not improved within 1 week -Patient to return as needed.   #Porokeratosis plantar aspect of the fifth metatarsal head bilateral foot All symptomatic hyperkeratoses x2 lesions plantar aspect fifth metatarsal head were safely debrided with a sterile #15 blade to patient's level of comfort without incident. We discussed preventative and palliative care of these lesions including supportive and accommodative shoegear, padding, prefabricated and custom molded accommodative orthoses, use of a pumice stone and lotions/creams daily.   Return if symptoms worsen or fail to improve.         Everitt Amber, DPM [facility]                   12/15/2021

## 2021-12-29 ENCOUNTER — Encounter: Payer: Self-pay | Admitting: Internal Medicine

## 2021-12-29 ENCOUNTER — Ambulatory Visit (INDEPENDENT_AMBULATORY_CARE_PROVIDER_SITE_OTHER): Payer: Commercial Managed Care - PPO | Admitting: Internal Medicine

## 2021-12-29 VITALS — BP 164/108 | HR 85 | Temp 98.1°F | Ht 69.0 in | Wt 177.0 lb

## 2021-12-29 DIAGNOSIS — E118 Type 2 diabetes mellitus with unspecified complications: Secondary | ICD-10-CM

## 2021-12-29 DIAGNOSIS — E785 Hyperlipidemia, unspecified: Secondary | ICD-10-CM | POA: Diagnosis not present

## 2021-12-29 DIAGNOSIS — N1831 Chronic kidney disease, stage 3a: Secondary | ICD-10-CM | POA: Diagnosis not present

## 2021-12-29 DIAGNOSIS — Z0000 Encounter for general adult medical examination without abnormal findings: Secondary | ICD-10-CM

## 2021-12-29 DIAGNOSIS — N400 Enlarged prostate without lower urinary tract symptoms: Secondary | ICD-10-CM | POA: Diagnosis not present

## 2021-12-29 DIAGNOSIS — I10 Essential (primary) hypertension: Secondary | ICD-10-CM

## 2021-12-29 LAB — LIPID PANEL
Cholesterol: 102 mg/dL (ref 0–200)
HDL: 42.3 mg/dL (ref >39.00)
LDL Cholesterol: 37 mg/dL (ref 0–99)
NonHDL: 60
Total CHOL/HDL Ratio: 2
Triglycerides: 114 mg/dL (ref 0.0–149.0)
VLDL: 22.8 mg/dL (ref 0.0–40.0)

## 2021-12-29 LAB — URINALYSIS, ROUTINE W REFLEX MICROSCOPIC
Bilirubin Urine: NEGATIVE
Hgb urine dipstick: NEGATIVE
Ketones, ur: NEGATIVE
Leukocytes,Ua: NEGATIVE
Nitrite: NEGATIVE
RBC / HPF: NONE SEEN (ref 0–?)
Specific Gravity, Urine: 1.02 (ref 1.000–1.030)
Total Protein, Urine: NEGATIVE
Urine Glucose: NEGATIVE
Urobilinogen, UA: 0.2 (ref 0.0–1.0)
pH: 6 (ref 5.0–8.0)

## 2021-12-29 LAB — HEPATIC FUNCTION PANEL
ALT: 17 U/L (ref 0–53)
AST: 18 U/L (ref 0–37)
Albumin: 4.5 g/dL (ref 3.5–5.2)
Alkaline Phosphatase: 66 U/L (ref 39–117)
Bilirubin, Direct: 0.3 mg/dL (ref 0.0–0.3)
Total Bilirubin: 1.8 mg/dL — ABNORMAL HIGH (ref 0.2–1.2)
Total Protein: 7 g/dL (ref 6.0–8.3)

## 2021-12-29 LAB — TSH: TSH: 1.82 u[IU]/mL (ref 0.35–5.50)

## 2021-12-29 LAB — PSA: PSA: 3.75 ng/mL (ref 0.10–4.00)

## 2021-12-29 MED ORDER — AMLODIPINE BESYLATE 10 MG PO TABS: 10.0000 mg | ORAL_TABLET | Freq: Every day | ORAL | 0 refills | Status: DC

## 2021-12-29 MED ORDER — OLMESARTAN MEDOXOMIL 40 MG PO TABS: 40.0000 mg | ORAL_TABLET | Freq: Every day | ORAL | 0 refills | Status: DC

## 2021-12-29 NOTE — Progress Notes (Unsigned)
Subjective:  Patient ID: Brett Robles, male    DOB: May 10, 1950  Age: 71 y.o. MRN: [id number]  CC: Annual Exam, Hypertension, Hyperlipidemia, and Diabetes   HPI SONAM HUELSMANN presents for a CPX and f/up -  He complains that for 3 weeks his blood pressure has not been well-controlled and he has had headaches.  Wilder Glade has been prescribed but he is not yet taking it.  He is active and denies chest pain, shortness of breath, diaphoresis, or edema.  He tells me he is taking the ARB.  He recently saw a nephrologist.  Outpatient Medications Prior to Visit  Medication Sig Dispense Refill   aspirin EC 81 MG tablet Take 1 tablet (81 mg total) by mouth daily. 90 tablet 1   chlorhexidine (PERIDEX) 0.12 % solution SMARTSIG:By Mouth     colchicine 0.6 MG tablet Take 1 tablet (0.6 mg total) by mouth daily for 14 days. 14 tablet 0   dorzolamide-timolol (COSOPT) 22.3-6.8 MG/ML ophthalmic solution Place 1 drop into the right eye 2 (two) times daily.     pantoprazole (PROTONIX) 40 MG tablet TAKE 1 TABLET BY MOUTH EVERY DAY 90 tablet 1   rosuvastatin (CRESTOR) 10 MG tablet TAKE 1 TABLET BY MOUTH EVERY DAY 90 tablet 1   BREO ELLIPTA 100-25 MCG/INH AEPB TAKE 1 PUFF BY MOUTH EVERY DAY 60 each 3   methylPREDNISolone (MEDROL DOSEPAK) 4 MG TBPK tablet Take as directed for 6 days 1 each 0   olmesartan (BENICAR) 40 MG tablet Take 1 tablet (40 mg total) by mouth daily. 90 tablet 1   dapagliflozin propanediol (FARXIGA) 10 MG TABS tablet Take 1 tablet (10 mg total) by mouth daily before breakfast. 90 tablet 1   No facility-administered medications prior to visit.    ROS Review of Systems  Constitutional: Negative.  Negative for diaphoresis, fatigue and unexpected weight change.  HENT: Negative.    Eyes: Negative.  Negative for visual disturbance.  Respiratory: Negative.  Negative for cough, chest tightness, shortness of breath and wheezing.   Cardiovascular:  Negative for chest pain, palpitations and leg  swelling.  Gastrointestinal:  Negative for abdominal pain, constipation, diarrhea, nausea and vomiting.  Endocrine: Negative.   Genitourinary: Negative.  Negative for difficulty urinating and hematuria.  Musculoskeletal: Negative.  Negative for myalgias and neck pain.  Skin: Negative.   Neurological:  Positive for headaches. Negative for dizziness, weakness, light-headedness and numbness.  Hematological:  Negative for adenopathy. Does not bruise/bleed easily.  Psychiatric/Behavioral: Negative.      Objective:  BP (!) 164/108 (BP Location: Left Arm, Patient Position: Sitting, Cuff Size: Large)   Pulse 85   Temp 98.1 F (36.7 C) (Oral)   Ht 5' 9"  (1.753 m)   Wt 177 lb (80.3 kg)   SpO2 93%   BMI 26.14 kg/m   BP Readings from Last 3 Encounters:  12/29/21 (!) 164/108  10/06/21 124/76  12/24/20 (!) 142/94    Wt Readings from Last 3 Encounters:  12/29/21 177 lb (80.3 kg)  10/06/21 177 lb (80.3 kg)  12/24/20 173 lb (78.5 kg)    Physical Exam Vitals reviewed.  HENT:     Nose: Nose normal.     Mouth/Throat:     Mouth: Mucous membranes are moist.  Eyes:     General: No scleral icterus.    Conjunctiva/sclera: Conjunctivae normal.  Cardiovascular:     Rate and Rhythm: Normal rate and regular rhythm.     Heart sounds: Normal heart sounds, S1 normal  and S2 normal. No murmur heard.    No friction rub. No gallop.     Comments: EKG- NSR, 76 bpm Minimal LVH No Q waves or ST/T wave changes Pulmonary:     Effort: Pulmonary effort is normal.     Breath sounds: No stridor. No wheezing, rhonchi or rales.  Abdominal:     General: Abdomen is flat.     Palpations: There is no mass.     Tenderness: There is no abdominal tenderness. There is no guarding.     Hernia: No hernia is present. There is no hernia in the left inguinal area or right inguinal area.  Genitourinary:    Pubic Area: No rash.      Penis: Normal and circumcised.      Testes: Normal.     Epididymis:     Right:  Normal.     Left: Normal.     Prostate: Enlarged. Not tender and no nodules present.     Rectum: Normal. Guaiac result negative. No mass, tenderness, anal fissure, external hemorrhoid or internal hemorrhoid. Normal anal tone.  Musculoskeletal:     Cervical back: Neck supple.     Right lower leg: No edema.     Left lower leg: No edema.  Lymphadenopathy:     Cervical: No cervical adenopathy.     Lower Body: No right inguinal adenopathy. No left inguinal adenopathy.  Skin:    General: Skin is warm.  Neurological:     General: No focal deficit present.  Psychiatric:        Mood and Affect: Mood normal.        Behavior: Behavior normal.     Lab Results  Component Value Date   WBC 8.0 10/06/2021   HGB 13.8 10/06/2021   HCT 40.7 10/06/2021   PLT 194.0 10/06/2021   GLUCOSE 104 (H) 10/06/2021   CHOL 102 12/29/2021   TRIG 114.0 12/29/2021   HDL 42.30 12/29/2021   LDLCALC 37 12/29/2021   ALT 17 12/29/2021   AST 18 12/29/2021   NA 141 10/06/2021   K 3.9 10/06/2021   CL 105 10/06/2021   CREATININE 1.61 (H) 10/06/2021   BUN 26 (H) 10/06/2021   CO2 30 10/06/2021   TSH 1.82 12/29/2021   PSA 3.75 12/29/2021   INR 0.9 12/19/2019   HGBA1C 6.4 10/06/2021   MICROALBUR <0.7 10/06/2021    US Abdomen Limited RUQ  Result Date: 01/25/2019 CLINICAL DATA:  71 year old male with elevated LFTs. EXAM: ULTRASOUND ABDOMEN LIMITED RIGHT UPPER QUADRANT COMPARISON:  None. FINDINGS: Gallbladder: No gallstones or wall thickening visualized. No sonographic Murphy sign noted by sonographer. Common bile duct: Diameter: 5 mm Liver: There is diffuse increased liver echogenicity most commonly seen in the setting of fatty infiltration. Superimposed inflammation or fibrosis is not excluded. Clinical correlation is recommended. Portal vein is patent on color Doppler imaging with normal direction of blood flow towards the liver. Other: None. IMPRESSION: Fatty liver, otherwise unremarkable right upper quadrant  ultrasound. Electronically Signed   By: Anner Crete M.D.   On: 01/25/2019 10:20    Assessment & Plan:   Torence was seen today for annual exam, hypertension, hyperlipidemia and diabetes.  Diagnoses and all orders for this visit:  Essential hypertension, benign- His blood pressure is not adequately well-controlled.  Will check labs to screen for secondary causes and endorgan damage.  Will add amlodipine to the ARB. -     TSH; Future -     Urinalysis, Routine w reflex microscopic;  Future -     EKG 12-Lead -     Aldosterone + renin activity w/ ratio; Future -     amLODipine (NORVASC) 10 MG tablet; Take 1 tablet (10 mg total) by mouth daily. -     olmesartan (BENICAR) 40 MG tablet; Take 1 tablet (40 mg total) by mouth daily. -     Aldosterone + renin activity w/ ratio -     Urinalysis, Routine w reflex microscopic -     TSH  Benign prostatic hyperplasia without lower urinary tract symptoms- PSA is normal. -     PSA; Future -     PSA  Stage 3a chronic kidney disease ([place])- Will try to get better control of his blood pressure.  He agrees to start taking [place]. -     Urinalysis, Routine w reflex microscopic; Future -     Urinalysis, Routine w reflex microscopic  Routine general medical examination at a health care facility- Exam completed, labs reviewed, vaccines reviewed and updated, cancer screenings are up-to-date, patient education was given.  Hyperlipidemia with target LDL less than 130 - LDL goal achieved. Doing well on the statin  -     Lipid panel; Future -     Hepatic function panel; Future -     Hepatic function panel -     Lipid panel  Type II diabetes mellitus with manifestations ([place])- His blood sugar is adequately well-controlled. -     HM Diabetes Foot Exam   I have discontinued Hillman L. Newnam "Don"'s Breo Ellipta, dapagliflozin propanediol, and methylPREDNISolone. I am also having him start on amLODipine. Additionally, I am having him maintain his aspirin EC,  dorzolamide-timolol, chlorhexidine, pantoprazole, rosuvastatin, colchicine, and olmesartan.  Meds ordered this encounter  Medications   amLODipine (NORVASC) 10 MG tablet    Sig: Take 1 tablet (10 mg total) by mouth daily.    Dispense:  90 tablet    Refill:  0   olmesartan (BENICAR) 40 MG tablet    Sig: Take 1 tablet (40 mg total) by mouth daily.    Dispense:  90 tablet    Refill:  0     Follow-up: Return in about 3 months (around 03/30/2022).  Scarlette Calico, MD

## 2021-12-29 NOTE — Patient Instructions (Signed)
Hypertension, Adult High blood pressure (hypertension) is when the force of blood pumping through the arteries is too strong. The arteries are the blood vessels that carry blood from the heart throughout the body. Hypertension forces the heart to work harder to pump blood and may cause arteries to become narrow or stiff. Untreated or uncontrolled hypertension can lead to a heart attack, heart failure, a stroke, kidney disease, and other problems. A blood pressure reading consists of a higher number over a lower number. Ideally, your blood pressure should be below 120/80. The first ("top") number is called the systolic pressure. It is a measure of the pressure in your arteries as your heart beats. The second ("bottom") number is called the diastolic pressure. It is a measure of the pressure in your arteries as the heart relaxes. What are the causes? The exact cause of this condition is not known. There are some conditions that result in high blood pressure. What increases the risk? Certain factors may make you more likely to develop high blood pressure. Some of these risk factors are under your control, including: Smoking. Not getting enough exercise or physical activity. Being overweight. Having too much fat, sugar, calories, or salt (sodium) in your diet. Drinking too much alcohol. Other risk factors include: Having a personal history of heart disease, diabetes, high cholesterol, or kidney disease. Stress. Having a family history of high blood pressure and high cholesterol. Having obstructive sleep apnea. Age. The risk increases with age. What are the signs or symptoms? High blood pressure may not cause symptoms. Very high blood pressure (hypertensive crisis) may cause: Headache. Fast or irregular heartbeats (palpitations). Shortness of breath. Nosebleed. Nausea and vomiting. Vision changes. Severe chest pain, dizziness, and seizures. How is this diagnosed? This condition is diagnosed by  measuring your blood pressure while you are seated, with your arm resting on a flat surface, your legs uncrossed, and your feet flat on the floor. The cuff of the blood pressure monitor will be placed directly against the skin of your upper arm at the level of your heart. Blood pressure should be measured at least twice using the same arm. Certain conditions can cause a difference in blood pressure between your right and left arms. If you have a high blood pressure reading during one visit or you have normal blood pressure with other risk factors, you may be asked to: Return on a different day to have your blood pressure checked again. Monitor your blood pressure at home for 1 week or longer. If you are diagnosed with hypertension, you may have other blood or imaging tests to help your health care provider understand your overall risk for other conditions. How is this treated? This condition is treated by making healthy lifestyle changes, such as eating healthy foods, exercising more, and reducing your alcohol intake. You may be referred for counseling on a healthy diet and physical activity. Your health care provider may prescribe medicine if lifestyle changes are not enough to get your blood pressure under control and if: Your systolic blood pressure is above 130. Your diastolic blood pressure is above 80. Your personal target blood pressure may vary depending on your medical conditions, your age, and other factors. Follow these instructions at home: Eating and drinking  Eat a diet that is high in fiber and potassium, and low in sodium, added sugar, and fat. An example of this eating plan is called the DASH diet. DASH stands for Dietary Approaches to Stop Hypertension. To eat this way: Eat   plenty of fresh fruits and vegetables. Try to fill one half of your plate at each meal with fruits and vegetables. Eat whole grains, such as whole-wheat pasta, brown rice, or whole-grain bread. Fill about one  fourth of your plate with whole grains. Eat or drink low-fat dairy products, such as skim milk or low-fat yogurt. Avoid fatty cuts of meat, processed or cured meats, and poultry with skin. Fill about one fourth of your plate with lean proteins, such as fish, chicken without skin, beans, eggs, or tofu. Avoid pre-made and processed foods. These tend to be higher in sodium, added sugar, and fat. Reduce your daily sodium intake. Many people with hypertension should eat less than 1,500 mg of sodium a day. Do not drink alcohol if: Your health care provider tells you not to drink. You are pregnant, may be pregnant, or are planning to become pregnant. If you drink alcohol: Limit how much you have to: 0-1 drink a day for women. 0-2 drinks a day for men. Know how much alcohol is in your drink. In the [place] one drink equals one 12 oz bottle of beer (355 mL), one 5 oz glass of wine (148 mL), or one 1 oz glass of hard liquor (44 mL). Lifestyle  Work with your health care provider to maintain a healthy body weight or to lose weight. Ask what an ideal weight is for you. Get at least 30 minutes of exercise that causes your heart to beat faster (aerobic exercise) most days of the week. Activities may include walking, swimming, or biking. Include exercise to strengthen your muscles (resistance exercise), such as Pilates or lifting weights, as part of your weekly exercise routine. Try to do these types of exercises for 30 minutes at least 3 days a week. Do not use any products that contain nicotine or tobacco. These products include cigarettes, chewing tobacco, and vaping devices, such as e-cigarettes. If you need help quitting, ask your health care provider. Monitor your blood pressure at home as told by your health care provider. Keep all follow-up visits. This is important. Medicines Take over-the-counter and prescription medicines only as told by your health care provider. Follow directions carefully. Blood  pressure medicines must be taken as prescribed. Do not skip doses of blood pressure medicine. Doing this puts you at risk for problems and can make the medicine less effective. Ask your health care provider about side effects or reactions to medicines that you should watch for. Contact a health care provider if you: Think you are having a reaction to a medicine you are taking. Have headaches that keep coming back (recurring). Feel dizzy. Have swelling in your ankles. Have trouble with your vision. Get help right away if you: Develop a severe headache or confusion. Have unusual weakness or numbness. Feel faint. Have severe pain in your chest or abdomen. Vomit repeatedly. Have trouble breathing. These symptoms may be an emergency. Get help right away. Call 911. Do not wait to see if the symptoms will go away. Do not drive yourself to the hospital. Summary Hypertension is when the force of blood pumping through your arteries is too strong. If this condition is not controlled, it may put you at risk for serious complications. Your personal target blood pressure may vary depending on your medical conditions, your age, and other factors. For most people, a normal blood pressure is less than 120/80. Hypertension is treated with lifestyle changes, medicines, or a combination of both. Lifestyle changes include losing weight, eating a healthy,   low-sodium diet, exercising more, and limiting alcohol. This information is not intended to replace advice given to you by your health care provider. Make sure you discuss any questions you have with your health care provider. Document Revised: 11/19/2020 Document Reviewed: 11/19/2020 Elsevier Patient Education  2023 [employer]  

## 2022-01-02 ENCOUNTER — Telehealth: Payer: Self-pay | Admitting: Internal Medicine

## 2022-01-02 NOTE — Telephone Encounter (Signed)
Patient came by to pick up Physical Report Form left with provider at appt 12/29/21. He says today is the deadline to turn it in to work. Please contact pt today to advise. Pt ph [phone]

## 2022-01-02 NOTE — Telephone Encounter (Signed)
Pt has been informed and stated he received it in the mail today.

## 2022-01-04 LAB — ALDOSTERONE + RENIN ACTIVITY W/ RATIO
ALDO / PRA Ratio: 3.3 Ratio (ref 0.9–28.9)
Aldosterone: 2 ng/dL
Renin Activity: 0.61 ng/mL/h (ref 0.25–5.82)

## 2022-02-06 ENCOUNTER — Other Ambulatory Visit: Payer: Self-pay | Admitting: Internal Medicine

## 2022-02-06 ENCOUNTER — Encounter: Payer: Self-pay | Admitting: Internal Medicine

## 2022-02-06 DIAGNOSIS — I10 Essential (primary) hypertension: Secondary | ICD-10-CM

## 2022-02-07 ENCOUNTER — Other Ambulatory Visit: Payer: Self-pay | Admitting: Internal Medicine

## 2022-02-07 DIAGNOSIS — I10 Essential (primary) hypertension: Secondary | ICD-10-CM

## 2022-02-07 MED ORDER — INDAPAMIDE 1.25 MG PO TABS: 1.2500 mg | ORAL_TABLET | Freq: Every day | ORAL | 0 refills | Status: DC

## 2022-02-08 ENCOUNTER — Other Ambulatory Visit: Payer: Self-pay | Admitting: Internal Medicine

## 2022-02-08 DIAGNOSIS — K269 Duodenal ulcer, unspecified as acute or chronic, without hemorrhage or perforation: Secondary | ICD-10-CM

## 2022-02-12 ENCOUNTER — Encounter: Payer: Self-pay | Admitting: Internal Medicine

## 2022-02-20 ENCOUNTER — Ambulatory Visit (INDEPENDENT_AMBULATORY_CARE_PROVIDER_SITE_OTHER): Payer: Commercial Managed Care - PPO | Admitting: Internal Medicine

## 2022-02-20 ENCOUNTER — Encounter: Payer: Self-pay | Admitting: Internal Medicine

## 2022-02-20 VITALS — BP 132/76 | HR 67 | Temp 97.9°F | Resp 16 | Ht 69.0 in | Wt 173.0 lb

## 2022-02-20 DIAGNOSIS — N1831 Chronic kidney disease, stage 3a: Secondary | ICD-10-CM

## 2022-02-20 DIAGNOSIS — I10 Essential (primary) hypertension: Secondary | ICD-10-CM

## 2022-02-20 DIAGNOSIS — E118 Type 2 diabetes mellitus with unspecified complications: Secondary | ICD-10-CM

## 2022-02-20 LAB — URINALYSIS, ROUTINE W REFLEX MICROSCOPIC
Bilirubin Urine: NEGATIVE
Hgb urine dipstick: NEGATIVE
Ketones, ur: NEGATIVE
Leukocytes,Ua: NEGATIVE
Nitrite: NEGATIVE
RBC / HPF: NONE SEEN (ref 0–?)
Specific Gravity, Urine: 1.025 (ref 1.000–1.030)
Total Protein, Urine: NEGATIVE
Urine Glucose: NEGATIVE
Urobilinogen, UA: 0.2 (ref 0.0–1.0)
pH: 6 (ref 5.0–8.0)

## 2022-02-20 LAB — BASIC METABOLIC PANEL
BUN: 29 mg/dL — ABNORMAL HIGH (ref 6–23)
CO2: 30 mEq/L (ref 19–32)
Calcium: 9.4 mg/dL (ref 8.4–10.5)
Chloride: 103 mEq/L (ref 96–112)
Creatinine, Ser: 1.62 mg/dL — ABNORMAL HIGH (ref 0.40–1.50)
GFR: 41.75 mL/min — ABNORMAL LOW (ref >60.00)
Glucose, Bld: 102 mg/dL — ABNORMAL HIGH (ref 70–99)
Potassium: 4.3 mEq/L (ref 3.5–5.1)
Sodium: 140 mEq/L (ref 135–145)

## 2022-02-20 LAB — HEMOGLOBIN A1C: Hgb A1c MFr Bld: 6.6 % — ABNORMAL HIGH (ref 4.6–6.5)

## 2022-02-20 MED ORDER — EMPAGLIFLOZIN 10 MG PO TABS: 10.0000 mg | ORAL_TABLET | Freq: Every day | ORAL | 1 refills | Status: DC

## 2022-02-20 NOTE — Progress Notes (Signed)
Subjective:  Patient ID: Brett Robles, male    DOB: [birth year]  Age: 72 y.o. MRN: [id number]  CC: Hypertension and Diabetes   HPI [name] presents for f/up -  He added indapamide to the ARB and CCB and says his blood pressure has been well-controlled.  He denies headache, blurred vision, chest pain, shortness of breath, dyspnea on exertion, or edema.  Outpatient Medications Prior to Visit  Medication Sig Dispense Refill   amLODipine (NORVASC) 10 MG tablet Take 1 tablet (10 mg total) by mouth daily. 90 tablet 0   aspirin EC 81 MG tablet Take 1 tablet (81 mg total) by mouth daily. 90 tablet 1   chlorhexidine (PERIDEX) 0.12 % solution SMARTSIG:By Mouth     dorzolamide-timolol (COSOPT) 22.3-6.8 MG/ML ophthalmic solution Place 1 drop into the right eye 2 (two) times daily.     indapamide (LOZOL) 1.25 MG tablet Take 1 tablet (1.25 mg total) by mouth daily. 30 tablet 0   olmesartan (BENICAR) 40 MG tablet Take 1 tablet (40 mg total) by mouth daily. 90 tablet 0   pantoprazole (PROTONIX) 40 MG tablet TAKE 1 TABLET BY MOUTH EVERY DAY 90 tablet 1   rosuvastatin (CRESTOR) 10 MG tablet TAKE 1 TABLET BY MOUTH EVERY DAY 90 tablet 1   colchicine 0.6 MG tablet Take 1 tablet (0.6 mg total) by mouth daily for 14 days. 14 tablet 0   No facility-administered medications prior to visit.    ROS Review of Systems  Constitutional: Negative.  Negative for diaphoresis and fatigue.  HENT: Negative.    Eyes: Negative.   Respiratory:  Negative for cough, chest tightness, shortness of breath and wheezing.   Cardiovascular:  Negative for chest pain, palpitations and leg swelling.  Gastrointestinal:  Negative for abdominal pain, diarrhea, nausea and vomiting.  Endocrine: Negative.   Genitourinary: Negative.  Negative for difficulty urinating and hematuria.  Musculoskeletal: Negative.  Negative for arthralgias and myalgias.  Skin: Negative.   Neurological:  Negative for dizziness, weakness,  light-headedness and headaches.  Hematological:  Negative for adenopathy. Does not bruise/bleed easily.  Psychiatric/Behavioral: Negative.      Objective:  BP 132/76 (BP Location: Left Arm, Patient Position: Sitting, Cuff Size: Large)   Pulse 67   Temp 97.9 F (36.6 C) (Oral)   Resp 16   Ht '5\' 9"'$  (1.753 m)   Wt 173 lb (78.5 kg)   SpO2 93%   BMI 25.55 kg/m   BP Readings from Last 3 Encounters:  02/20/22 132/76  12/29/21 (!) 164/108  10/06/21 124/76    Wt Readings from Last 3 Encounters:  02/20/22 173 lb (78.5 kg)  12/29/21 177 lb (80.3 kg)  10/06/21 177 lb (80.3 kg)    Physical Exam Vitals reviewed.  HENT:     Mouth/Throat:     Mouth: Mucous membranes are moist.  Eyes:     General: No scleral icterus.    Conjunctiva/sclera: Conjunctivae normal.  Cardiovascular:     Rate and Rhythm: Normal rate and regular rhythm.     Heart sounds: No murmur heard. Pulmonary:     Effort: Pulmonary effort is normal.     Breath sounds: No stridor. No wheezing, rhonchi or rales.  Abdominal:     General: Abdomen is flat.     Palpations: There is no mass.     Tenderness: There is no abdominal tenderness. There is no guarding.     Hernia: No hernia is present.  Musculoskeletal:  General: Normal range of motion.     Cervical back: Neck supple.     Right lower leg: No edema.     Left lower leg: No edema.  Lymphadenopathy:     Cervical: No cervical adenopathy.  Skin:    General: Skin is warm and dry.  Neurological:     General: No focal deficit present.     Mental Status: He is alert.  Psychiatric:        Mood and Affect: Mood normal.        Behavior: Behavior normal.     Lab Results  Component Value Date   WBC 8.0 10/06/2021   HGB 13.8 10/06/2021   HCT 40.7 10/06/2021   PLT 194.0 10/06/2021   GLUCOSE 102 (H) 02/20/2022   CHOL 102 12/29/2021   TRIG 114.0 12/29/2021   HDL 42.30 12/29/2021   LDLCALC 37 12/29/2021   ALT 17 12/29/2021   AST 18 12/29/2021   NA 140  02/20/2022   K 4.3 02/20/2022   CL 103 02/20/2022   CREATININE 1.62 (H) 02/20/2022   BUN 29 (H) 02/20/2022   CO2 30 02/20/2022   TSH 1.82 12/29/2021   PSA 3.75 12/29/2021   INR 0.9 12/19/2019   HGBA1C 6.6 (H) 02/20/2022   MICROALBUR <0.7 10/06/2021    US Abdomen Limited RUQ  Result Date: 01/25/2019 CLINICAL DATA:  72 year old male with elevated LFTs. EXAM: ULTRASOUND ABDOMEN LIMITED RIGHT UPPER QUADRANT COMPARISON:  None. FINDINGS: Gallbladder: No gallstones or wall thickening visualized. No sonographic Murphy sign noted by sonographer. Common bile duct: Diameter: 5 mm Liver: There is diffuse increased liver echogenicity most commonly seen in the setting of fatty infiltration. Superimposed inflammation or fibrosis is not excluded. Clinical correlation is recommended. Portal vein is patent on color Doppler imaging with normal direction of blood flow towards the liver. Other: None. IMPRESSION: Fatty liver, otherwise unremarkable right upper quadrant ultrasound. Electronically Signed   By: Anner Crete M.D.   On: 01/25/2019 10:20    Assessment & Plan:   Janos was seen today for hypertension and diabetes.  Diagnoses and all orders for this visit:  Essential hypertension, benign- His blood pressure is adequately well-controlled and his renal function is stable.  Will continue the 3 antihypertensives. -     Urinalysis, Routine w reflex microscopic; Future -     Basic metabolic panel; Future -     Basic metabolic panel -     Urinalysis, Routine w reflex microscopic  Type II diabetes mellitus with manifestations ([place])- His blood sugar is adequately well-controlled. -     Hemoglobin A1c; Future -     Basic metabolic panel; Future -     Basic metabolic panel -     Hemoglobin A1c -     empagliflozin (JARDIANCE) 10 MG TABS tablet; Take 1 tablet (10 mg total) by mouth daily before breakfast.  Stage 3a chronic kidney disease ([place])- Will start an SGLT2 inhibitor for renal protection. -      Urinalysis, Routine w reflex microscopic; Future -     Basic metabolic panel; Future -     Basic metabolic panel -     Urinalysis, Routine w reflex microscopic -     empagliflozin (JARDIANCE) 10 MG TABS tablet; Take 1 tablet (10 mg total) by mouth daily before breakfast.   I am having Sederick L. Render "Don" start on empagliflozin. I am also having him maintain his aspirin EC, dorzolamide-timolol, chlorhexidine, rosuvastatin, colchicine, amLODipine, olmesartan, indapamide, and pantoprazole.  Meds  ordered this encounter  Medications   empagliflozin (JARDIANCE) 10 MG TABS tablet    Sig: Take 1 tablet (10 mg total) by mouth daily before breakfast.    Dispense:  90 tablet    Refill:  1     Follow-up: Return in about 6 months (around 08/21/2022).  Scarlette Calico, MD

## 2022-02-20 NOTE — Patient Instructions (Signed)
Hypertension, Adult High blood pressure (hypertension) is when the force of blood pumping through the arteries is too strong. The arteries are the blood vessels that carry blood from the heart throughout the body. Hypertension forces the heart to work harder to pump blood and may cause arteries to become narrow or stiff. Untreated or uncontrolled hypertension can lead to a heart attack, heart failure, a stroke, kidney disease, and other problems. A blood pressure reading consists of a higher number over a lower number. Ideally, your blood pressure should be below 120/80. The first ("top") number is called the systolic pressure. It is a measure of the pressure in your arteries as your heart beats. The second ("bottom") number is called the diastolic pressure. It is a measure of the pressure in your arteries as the heart relaxes. What are the causes? The exact cause of this condition is not known. There are some conditions that result in high blood pressure. What increases the risk? Certain factors may make you more likely to develop high blood pressure. Some of these risk factors are under your control, including: Smoking. Not getting enough exercise or physical activity. Being overweight. Having too much fat, sugar, calories, or salt (sodium) in your diet. Drinking too much alcohol. Other risk factors include: Having a personal history of heart disease, diabetes, high cholesterol, or kidney disease. Stress. Having a family history of high blood pressure and high cholesterol. Having obstructive sleep apnea. Age. The risk increases with age. What are the signs or symptoms? High blood pressure may not cause symptoms. Very high blood pressure (hypertensive crisis) may cause: Headache. Fast or irregular heartbeats (palpitations). Shortness of breath. Nosebleed. Nausea and vomiting. Vision changes. Severe chest pain, dizziness, and seizures. How is this diagnosed? This condition is diagnosed by  measuring your blood pressure while you are seated, with your arm resting on a flat surface, your legs uncrossed, and your feet flat on the floor. The cuff of the blood pressure monitor will be placed directly against the skin of your upper arm at the level of your heart. Blood pressure should be measured at least twice using the same arm. Certain conditions can cause a difference in blood pressure between your right and left arms. If you have a high blood pressure reading during one visit or you have normal blood pressure with other risk factors, you may be asked to: Return on a different day to have your blood pressure checked again. Monitor your blood pressure at home for 1 week or longer. If you are diagnosed with hypertension, you may have other blood or imaging tests to help your health care provider understand your overall risk for other conditions. How is this treated? This condition is treated by making healthy lifestyle changes, such as eating healthy foods, exercising more, and reducing your alcohol intake. You may be referred for counseling on a healthy diet and physical activity. Your health care provider may prescribe medicine if lifestyle changes are not enough to get your blood pressure under control and if: Your systolic blood pressure is above 130. Your diastolic blood pressure is above 80. Your personal target blood pressure may vary depending on your medical conditions, your age, and other factors. Follow these instructions at home: Eating and drinking  Eat a diet that is high in fiber and potassium, and low in sodium, added sugar, and fat. An example of this eating plan is called the DASH diet. DASH stands for Dietary Approaches to Stop Hypertension. To eat this way: Eat   plenty of fresh fruits and vegetables. Try to fill one half of your plate at each meal with fruits and vegetables. Eat whole grains, such as whole-wheat pasta, brown rice, or whole-grain bread. Fill about one  fourth of your plate with whole grains. Eat or drink low-fat dairy products, such as skim milk or low-fat yogurt. Avoid fatty cuts of meat, processed or cured meats, and poultry with skin. Fill about one fourth of your plate with lean proteins, such as fish, chicken without skin, beans, eggs, or tofu. Avoid pre-made and processed foods. These tend to be higher in sodium, added sugar, and fat. Reduce your daily sodium intake. Many people with hypertension should eat less than 1,500 mg of sodium a day. Do not drink alcohol if: Your health care provider tells you not to drink. You are pregnant, may be pregnant, or are planning to become pregnant. If you drink alcohol: Limit how much you have to: 0-1 drink a day for women. 0-2 drinks a day for men. Know how much alcohol is in your drink. In the [place] one drink equals one 12 oz bottle of beer (355 mL), one 5 oz glass of wine (148 mL), or one 1 oz glass of hard liquor (44 mL). Lifestyle  Work with your health care provider to maintain a healthy body weight or to lose weight. Ask what an ideal weight is for you. Get at least 30 minutes of exercise that causes your heart to beat faster (aerobic exercise) most days of the week. Activities may include walking, swimming, or biking. Include exercise to strengthen your muscles (resistance exercise), such as Pilates or lifting weights, as part of your weekly exercise routine. Try to do these types of exercises for 30 minutes at least 3 days a week. Do not use any products that contain nicotine or tobacco. These products include cigarettes, chewing tobacco, and vaping devices, such as e-cigarettes. If you need help quitting, ask your health care provider. Monitor your blood pressure at home as told by your health care provider. Keep all follow-up visits. This is important. Medicines Take over-the-counter and prescription medicines only as told by your health care provider. Follow directions carefully. Blood  pressure medicines must be taken as prescribed. Do not skip doses of blood pressure medicine. Doing this puts you at risk for problems and can make the medicine less effective. Ask your health care provider about side effects or reactions to medicines that you should watch for. Contact a health care provider if you: Think you are having a reaction to a medicine you are taking. Have headaches that keep coming back (recurring). Feel dizzy. Have swelling in your ankles. Have trouble with your vision. Get help right away if you: Develop a severe headache or confusion. Have unusual weakness or numbness. Feel faint. Have severe pain in your chest or abdomen. Vomit repeatedly. Have trouble breathing. These symptoms may be an emergency. Get help right away. Call 911. Do not wait to see if the symptoms will go away. Do not drive yourself to the hospital. Summary Hypertension is when the force of blood pumping through your arteries is too strong. If this condition is not controlled, it may put you at risk for serious complications. Your personal target blood pressure may vary depending on your medical conditions, your age, and other factors. For most people, a normal blood pressure is less than 120/80. Hypertension is treated with lifestyle changes, medicines, or a combination of both. Lifestyle changes include losing weight, eating a healthy,   low-sodium diet, exercising more, and limiting alcohol. This information is not intended to replace advice given to you by your health care provider. Make sure you discuss any questions you have with your health care provider. Document Revised: 11/19/2020 Document Reviewed: 11/19/2020 Elsevier Patient Education  2023 [employer]  

## 2022-03-03 ENCOUNTER — Other Ambulatory Visit: Payer: Self-pay | Admitting: Internal Medicine

## 2022-03-03 DIAGNOSIS — I10 Essential (primary) hypertension: Secondary | ICD-10-CM

## 2022-03-03 MED ORDER — INDAPAMIDE 1.25 MG PO TABS: 1.2500 mg | ORAL_TABLET | Freq: Every day | ORAL | 0 refills | Status: DC

## 2022-03-22 ENCOUNTER — Other Ambulatory Visit: Payer: Self-pay | Admitting: Internal Medicine

## 2022-03-22 DIAGNOSIS — I10 Essential (primary) hypertension: Secondary | ICD-10-CM

## 2022-03-23 MED ORDER — AMLODIPINE BESYLATE 10 MG PO TABS: 10.0000 mg | ORAL_TABLET | Freq: Every day | ORAL | 1 refills | Status: DC

## 2022-03-23 MED ORDER — OLMESARTAN MEDOXOMIL 40 MG PO TABS: 40.0000 mg | ORAL_TABLET | Freq: Every day | ORAL | 1 refills | Status: DC

## 2022-05-08 ENCOUNTER — Other Ambulatory Visit: Payer: Self-pay | Admitting: Internal Medicine

## 2022-05-08 DIAGNOSIS — E785 Hyperlipidemia, unspecified: Secondary | ICD-10-CM

## 2022-05-13 ENCOUNTER — Other Ambulatory Visit: Payer: Self-pay | Admitting: Internal Medicine

## 2022-05-13 DIAGNOSIS — T39395A Adverse effect of other nonsteroidal anti-inflammatory drugs [NSAID], initial encounter: Secondary | ICD-10-CM

## 2022-05-16 ENCOUNTER — Other Ambulatory Visit: Payer: Self-pay | Admitting: Internal Medicine

## 2022-05-16 DIAGNOSIS — I10 Essential (primary) hypertension: Secondary | ICD-10-CM

## 2022-07-10 ENCOUNTER — Other Ambulatory Visit: Payer: Self-pay | Admitting: Internal Medicine

## 2022-07-10 DIAGNOSIS — I10 Essential (primary) hypertension: Secondary | ICD-10-CM

## 2022-08-10 ENCOUNTER — Other Ambulatory Visit: Payer: Self-pay | Admitting: Internal Medicine

## 2022-08-10 DIAGNOSIS — E118 Type 2 diabetes mellitus with unspecified complications: Secondary | ICD-10-CM

## 2022-08-10 DIAGNOSIS — N1831 Chronic kidney disease, stage 3a: Secondary | ICD-10-CM

## 2022-08-13 ENCOUNTER — Other Ambulatory Visit: Payer: Self-pay | Admitting: Internal Medicine

## 2022-08-13 DIAGNOSIS — I10 Essential (primary) hypertension: Secondary | ICD-10-CM

## 2022-08-25 ENCOUNTER — Encounter: Payer: Self-pay | Admitting: Internal Medicine

## 2022-08-25 ENCOUNTER — Ambulatory Visit: Payer: Commercial Managed Care - PPO | Admitting: Internal Medicine

## 2022-08-25 VITALS — BP 118/66 | HR 67 | Temp 98.1°F | Ht 69.0 in | Wt 174.0 lb

## 2022-08-25 DIAGNOSIS — D126 Benign neoplasm of colon, unspecified: Secondary | ICD-10-CM

## 2022-08-25 DIAGNOSIS — N1831 Chronic kidney disease, stage 3a: Secondary | ICD-10-CM

## 2022-08-25 DIAGNOSIS — I10 Essential (primary) hypertension: Secondary | ICD-10-CM | POA: Diagnosis not present

## 2022-08-25 DIAGNOSIS — Z7984 Long term (current) use of oral hypoglycemic drugs: Secondary | ICD-10-CM

## 2022-08-25 DIAGNOSIS — E785 Hyperlipidemia, unspecified: Secondary | ICD-10-CM

## 2022-08-25 DIAGNOSIS — E118 Type 2 diabetes mellitus with unspecified complications: Secondary | ICD-10-CM

## 2022-08-25 LAB — BASIC METABOLIC PANEL
BUN: 30 mg/dL — ABNORMAL HIGH (ref 6–23)
CO2: 29 mEq/L (ref 19–32)
Calcium: 9.6 mg/dL (ref 8.4–10.5)
Chloride: 103 mEq/L (ref 96–112)
Creatinine, Ser: 1.76 mg/dL — ABNORMAL HIGH (ref 0.40–1.50)
GFR: 37.66 mL/min — ABNORMAL LOW (ref >60.00)
Glucose, Bld: 99 mg/dL (ref 70–99)
Potassium: 3.7 mEq/L (ref 3.5–5.1)
Sodium: 140 mEq/L (ref 135–145)

## 2022-08-25 LAB — HEMOGLOBIN A1C: Hgb A1c MFr Bld: 6.9 % — ABNORMAL HIGH (ref 4.6–6.5)

## 2022-08-25 LAB — CBC WITH DIFFERENTIAL/PLATELET
Basophils Absolute: 0.1 10*3/uL (ref 0.0–0.1)
Basophils Relative: 1 % (ref 0.0–3.0)
Eosinophils Absolute: 0.3 10*3/uL (ref 0.0–0.7)
Eosinophils Relative: 4.6 % (ref 0.0–5.0)
HCT: 44.7 % (ref 39.0–52.0)
Hemoglobin: 14.9 g/dL (ref 13.0–17.0)
Lymphocytes Relative: 30.5 % (ref 12.0–46.0)
Lymphs Abs: 2.3 10*3/uL (ref 0.7–4.0)
MCHC: 33.4 g/dL (ref 30.0–36.0)
MCV: 91.8 fl (ref 78.0–100.0)
Monocytes Absolute: 0.6 10*3/uL (ref 0.1–1.0)
Monocytes Relative: 8.4 % (ref 3.0–12.0)
Neutro Abs: 4.1 10*3/uL (ref 1.4–7.7)
Neutrophils Relative %: 55.5 % (ref 43.0–77.0)
Platelets: 217 10*3/uL (ref 150.0–400.0)
RBC: 4.87 Mil/uL (ref 4.22–5.81)
RDW: 13 % (ref 11.5–15.5)
WBC: 7.4 10*3/uL (ref 4.0–10.5)

## 2022-08-25 LAB — URINALYSIS, ROUTINE W REFLEX MICROSCOPIC
Bilirubin Urine: NEGATIVE
Hgb urine dipstick: NEGATIVE
Ketones, ur: NEGATIVE
Leukocytes,Ua: NEGATIVE
Nitrite: NEGATIVE
RBC / HPF: NONE SEEN (ref 0–?)
Specific Gravity, Urine: 1.02 (ref 1.000–1.030)
Total Protein, Urine: NEGATIVE
Urine Glucose: >=1000 — AB
Urobilinogen, UA: 0.2 (ref 0.0–1.0)
pH: 6 (ref 5.0–8.0)

## 2022-08-25 LAB — MICROALBUMIN / CREATININE URINE RATIO
Creatinine,U: 138.6 mg/dL
Microalb Creat Ratio: 0.5 mg/g (ref 0.0–30.0)
Microalb, Ur: <0.7 mg/dL (ref 0.0–1.9)

## 2022-08-25 MED ORDER — EMPAGLIFLOZIN 25 MG PO TABS
25.0000 mg | ORAL_TABLET | Freq: Every day | ORAL | 0 refills | Status: DC
Start: 2022-08-25 — End: 2022-11-20

## 2022-08-25 NOTE — Progress Notes (Unsigned)
Subjective:  Patient ID: Brett Robles, male    DOB: Aug 20, 1950  Age: 72 y.o. MRN: [id number]  CC: Hypertension, Hyperlipidemia, and Diabetes   HPI Brett Robles presents for f/up ----  Discussed the use of AI scribe software for clinical note transcription with the patient, who gave verbal consent to proceed.  History of Present Illness   The patient, an active older adult with a history of hypertension and gout, reports a decrease in balance while bicycling, necessitating lowering the seat for stability. He denies any recent falls or symptoms of cardiovascular distress such as chest pain, shortness of breath, dizziness, or lightheadedness. He also reports frequent thirst, which he attributes to a lifelong habit, and denies any changes in this pattern.  Urination patterns are normal, with one nocturnal episode after six to seven hours of sleep and regular rest stops every three hours during travel for work. The patient's blood pressure readings have been consistently under 120/72, which he monitors twice daily after medication intake and during sedentary periods in the evening.  The patient has been experiencing frequent muscle cramps, or "charley horses," and is concerned about an increase in bruising frequency. He denies any active bleeding or pain in the abdomen.   The patient has not experienced any severe gout attacks this year, which he attributes to dietary changes, specifically reducing seafood and red meat intake. He expresses interest in further dietary recommendations to prevent gout.       Outpatient Medications Prior to Visit  Medication Sig Dispense Refill   amLODipine (NORVASC) 10 MG tablet Take 1 tablet (10 mg total) by mouth daily. 90 tablet 1   aspirin EC 81 MG tablet Take 1 tablet (81 mg total) by mouth daily. 90 tablet 1   chlorhexidine (PERIDEX) 0.12 % solution SMARTSIG:By Mouth     dorzolamide-timolol (COSOPT) 22.3-6.8 MG/ML ophthalmic solution Place 1 drop into  the right eye 2 (two) times daily.     olmesartan (BENICAR) 40 MG tablet TAKE 1 TABLET BY MOUTH EVERY DAY 90 tablet 0   pantoprazole (PROTONIX) 40 MG tablet TAKE 1 TABLET BY MOUTH EVERY DAY 90 tablet 1   rosuvastatin (CRESTOR) 10 MG tablet TAKE 1 TABLET BY MOUTH EVERY DAY 90 tablet 1   empagliflozin (JARDIANCE) 10 MG TABS tablet Take 1 tablet (10 mg total) by mouth daily before breakfast. 90 tablet 1   indapamide (LOZOL) 1.25 MG tablet TAKE 1 TABLET BY MOUTH DAILY. 90 tablet 0   colchicine 0.6 MG tablet Take 1 tablet (0.6 mg total) by mouth daily for 14 days. 14 tablet 0   No facility-administered medications prior to visit.    ROS Review of Systems  Musculoskeletal:  Positive for myalgias. Negative for arthralgias and gait problem.  Neurological: Negative.  Negative for dizziness and weakness.  Hematological:  Negative for adenopathy. Does not bruise/bleed easily.  Psychiatric/Behavioral: Negative.      Objective:  BP 118/66 (BP Location: Left Arm, Patient Position: Sitting, Cuff Size: Large)   Pulse 67   Temp 98.1 F (36.7 C) (Oral)   Ht 5\' 9"  (1.753 m)   Wt 174 lb (78.9 kg)   SpO2 93%   BMI 25.70 kg/m   BP Readings from Last 3 Encounters:  08/25/22 118/66  02/20/22 132/76  12/29/21 (!) 164/108    Wt Readings from Last 3 Encounters:  08/25/22 174 lb (78.9 kg)  02/20/22 173 lb (78.5 kg)  12/29/21 177 lb (80.3 kg)    Physical Exam  Lab  Results  Component Value Date   WBC 7.4 08/25/2022   HGB 14.9 08/25/2022   HCT 44.7 08/25/2022   PLT 217.0 08/25/2022   GLUCOSE 99 08/25/2022   CHOL 102 12/29/2021   TRIG 114.0 12/29/2021   HDL 42.30 12/29/2021   LDLCALC 37 12/29/2021   ALT 17 12/29/2021   AST 18 12/29/2021   NA 140 08/25/2022   K 3.7 08/25/2022   CL 103 08/25/2022   CREATININE 1.76 (H) 08/25/2022   BUN 30 (H) 08/25/2022   CO2 29 08/25/2022   TSH 1.82 12/29/2021   PSA 3.75 12/29/2021   INR 0.9 12/19/2019   HGBA1C 6.9 (H) 08/25/2022   MICROALBUR <0.7  08/25/2022    US Abdomen Limited RUQ  Result Date: 01/25/2019 CLINICAL DATA:  72 year old male with elevated LFTs. EXAM: ULTRASOUND ABDOMEN LIMITED RIGHT UPPER QUADRANT COMPARISON:  None. FINDINGS: Gallbladder: No gallstones or wall thickening visualized. No sonographic Murphy sign noted by sonographer. Common bile duct: Diameter: 5 mm Liver: There is diffuse increased liver echogenicity most commonly seen in the setting of fatty infiltration. Superimposed inflammation or fibrosis is not excluded. Clinical correlation is recommended. Portal vein is patent on color Doppler imaging with normal direction of blood flow towards the liver. Other: None. IMPRESSION: Fatty liver, otherwise unremarkable right upper quadrant ultrasound. Electronically Signed   By: Elgie Collard M.D.   On: 01/25/2019 10:20    Assessment & Plan:  Type II diabetes mellitus with manifestations (HCC) -     Basic metabolic panel; Future -     Hemoglobin A1c; Future -     Microalbumin / creatinine urine ratio; Future -     HM Diabetes Foot Exam -     Empagliflozin; Take 1 tablet (25 mg total) by mouth daily before breakfast.  Dispense: 90 tablet; Refill: 0 -     CT CARDIAC SCORING (SELF PAY ONLY); Future  Hyperlipidemia with target LDL less than 130 -     Lipoprotein A (LPA); Future -     CT CARDIAC SCORING (SELF PAY ONLY); Future -     CK; Future -     Hepatic function panel; Future  Essential hypertension, benign -     Basic metabolic panel; Future -     CBC with Differential/Platelet; Future -     Hepatic function panel; Future  Stage 3a chronic kidney disease (HCC) -     Basic metabolic panel; Future -     Urinalysis, Routine w reflex microscopic; Future -     Microalbumin / creatinine urine ratio; Future -     Empagliflozin; Take 1 tablet (25 mg total) by mouth daily before breakfast.  Dispense: 90 tablet; Refill: 0  Tubular adenoma of colon -     Ambulatory referral to Gastroenterology     Follow-up:  Return in about 6 months (around 02/25/2023).  Sanda Linger, MD

## 2022-08-25 NOTE — Patient Instructions (Signed)
Low-Purine Eating Plan A low-purine eating plan involves making food choices to limit your purine intake. Purine is a kind of uric acid. Too much uric acid in your blood can cause certain conditions, such as gout and kidney stones. Eating a low-purine diet may help control these conditions. What are tips for following this plan? Shopping Avoid buying products that contain high-fructose corn syrup. Check for this on food labels. It is commonly found in many processed foods and soft drinks. Be sure to check for it in baked goods such as cookies, canned fruits, and cereals and cereal bars. Avoid buying veal, chicken breast with skin, lamb, and organ meats such as liver. These types of meats tend to have the highest purine content. Choose dairy products. These may lower uric acid levels. Avoid certain types of fish. Not all fish and seafood have high purine content. Examples with high purine content include anchovies, trout, tuna, sardines, and salmon. Avoid buying beverages that contain alcohol, particularly beer and hard liquor. Alcohol can affect the way your body gets rid of uric acid. Meal planning  Learn which foods do or do not affect you. If you find out that a food tends to cause your gout symptoms to flare up, avoid eating that food. You can enjoy foods that do not cause problems. If you have any questions about a food item, talk with your dietitian or health care provider. Reduce the overall amount of meat in your diet. When you do eat meat, choose ones with lower purine content. Include plenty of fruits and vegetables. Although some vegetables may have a high purine content--such as asparagus, mushrooms, spinach, or cauliflower--it has been shown that these do not contribute to uric acid blood levels as much. Consume at least 1 dairy serving a day. This has been shown to decrease uric acid levels. General information If you drink alcohol: Limit how much you have to: 0-1 drink a day for  women who are not pregnant. 0-2 drinks a day for men. Know how much alcohol is in a drink. In the [place] one drink equals one 12 oz bottle of beer (355 mL), one 5 oz glass of wine (148 mL), or one 1 oz glass of hard liquor (44 mL). Drink plenty of water. Try to drink enough to keep your urine pale yellow. Fluids can help remove uric acid from your body. Work with your health care provider and dietitian to develop a plan to achieve or maintain a healthy weight. Losing weight may help reduce uric acid in your blood. What foods are recommended? The following are some types of foods that are good choices when limiting purine intake: Fresh or frozen fruits and vegetables. Whole grains, breads, cereals, and pasta. Rice. Beans, peas, legumes. Nuts and seeds. Dairy products. Fats and oils. The items listed above may not be a complete list. Talk with a dietitian about what dietary choices are best for you. What foods are not recommended? Limit your intake of foods high in purines, including: Beer and other alcohol. Meat-based gravy or sauce. Canned or fresh fish, such as: Anchovies, sardines, herring, salmon, and tuna. Mussels and scallops. Codfish, trout, and haddock. Bacon, veal, chicken breast with skin, and lamb. Organ meats, such as: Liver or kidney. Tripe. Sweetbreads (thymus gland or pancreas). Wild Education officer, environmental. Yeast or yeast extract supplements. Drinks sweetened with high-fructose corn syrup, such as soda. Processed foods made with high-fructose corn syrup. The items listed above may not be a complete list of foods  and beverages you should limit. Contact a dietitian for more information. Summary Eating a low-purine diet may help control conditions caused by too much uric acid in the body, such as gout or kidney stones. Choose low-purine foods, limit alcohol, and limit high-fructose corn syrup. You will learn over time which foods do or do not affect you. If you find out that a  food tends to cause your gout symptoms to flare up, avoid eating that food. This information is not intended to replace advice given to you by your health care provider. Make sure you discuss any questions you have with your health care provider. Document Revised: 12/26/2020 Document Reviewed: 12/26/2020 Elsevier Patient Education  2024 [employer].

## 2022-09-01 ENCOUNTER — Encounter: Payer: Self-pay | Admitting: Internal Medicine

## 2022-09-11 ENCOUNTER — Other Ambulatory Visit: Payer: Self-pay | Admitting: Internal Medicine

## 2022-09-11 DIAGNOSIS — I10 Essential (primary) hypertension: Secondary | ICD-10-CM

## 2022-09-22 ENCOUNTER — Ambulatory Visit
Admission: RE | Admit: 2022-09-22 | Discharge: 2022-09-22 | Disposition: A | Discharge status: Home / Self Care | Payer: No Typology Code available for payment source | Source: Ambulatory Visit | Attending: Internal Medicine | Admitting: Internal Medicine

## 2022-09-22 DIAGNOSIS — E118 Type 2 diabetes mellitus with unspecified complications: Secondary | ICD-10-CM

## 2022-09-22 DIAGNOSIS — E785 Hyperlipidemia, unspecified: Secondary | ICD-10-CM

## 2022-09-29 ENCOUNTER — Other Ambulatory Visit: Payer: Self-pay | Admitting: Internal Medicine

## 2022-09-29 DIAGNOSIS — R931 Abnormal findings on diagnostic imaging of heart and coronary circulation: Secondary | ICD-10-CM | POA: Insufficient documentation

## 2022-10-27 ENCOUNTER — Telehealth: Payer: Self-pay | Admitting: Internal Medicine

## 2022-10-27 NOTE — Telephone Encounter (Signed)
Melissa from [facility] called and said patient was referred to get a CT cardia morph by Dr. Yetta Barre and is scheduled for 10/30/2022. They said he needs a prior authorization for this. Can this be started for them? Best callback for Efraim Kaufmann is [phone], extension [phone].

## 2022-10-29 ENCOUNTER — Telehealth (HOSPITAL_COMMUNITY): Payer: Self-pay | Admitting: Emergency Medicine

## 2022-10-29 NOTE — Telephone Encounter (Signed)
Patient called back and the pre authorization number for his insurance is [phone]

## 2022-10-29 NOTE — Telephone Encounter (Signed)
Pt call back checking on the status of his authorization because his appt is tomorrow. Please advise.

## 2022-10-29 NOTE — Telephone Encounter (Signed)
Patient also said that his insurance states they did not receive prior authorization for the mri done in July, 2024.

## 2022-10-29 NOTE — Telephone Encounter (Signed)
Spoke to patient, insurance requested clinicals to be uploaded. Appointment was re-scheduled.   Awaiting approval from insurance. Will contact patient when it's approved.

## 2022-10-29 NOTE — Telephone Encounter (Signed)
Reaching out to patient to offer assistance regarding upcoming cardiac imaging study; pt verbalizes understanding of appt date/time, parking situation and where to check in, pre-test NPO status and medications ordered, and verified current allergies; name and call back number provided for further questions should they arise Cayne Yom RN Navigator Cardiac Imaging [facility] [phone] office [phone] cell 

## 2022-10-30 ENCOUNTER — Ambulatory Visit (HOSPITAL_COMMUNITY): Payer: Commercial Managed Care - PPO

## 2022-11-08 ENCOUNTER — Other Ambulatory Visit: Payer: Self-pay | Admitting: Internal Medicine

## 2022-11-08 DIAGNOSIS — E785 Hyperlipidemia, unspecified: Secondary | ICD-10-CM

## 2022-11-08 DIAGNOSIS — K269 Duodenal ulcer, unspecified as acute or chronic, without hemorrhage or perforation: Secondary | ICD-10-CM

## 2022-11-13 ENCOUNTER — Ambulatory Visit (HOSPITAL_COMMUNITY): Payer: Commercial Managed Care - PPO

## 2022-11-13 ENCOUNTER — Encounter (HOSPITAL_COMMUNITY): Payer: Self-pay

## 2022-11-20 ENCOUNTER — Other Ambulatory Visit: Payer: Self-pay | Admitting: Internal Medicine

## 2022-11-20 DIAGNOSIS — N1831 Chronic kidney disease, stage 3a: Secondary | ICD-10-CM

## 2022-11-20 DIAGNOSIS — E118 Type 2 diabetes mellitus with unspecified complications: Secondary | ICD-10-CM

## 2022-11-20 MED ORDER — EMPAGLIFLOZIN 25 MG PO TABS: 25.0000 mg | ORAL_TABLET | Freq: Every day | ORAL | 0 refills | Status: DC

## 2023-01-01 ENCOUNTER — Other Ambulatory Visit: Payer: Self-pay | Admitting: Internal Medicine

## 2023-01-01 DIAGNOSIS — I2510 Atherosclerotic heart disease of native coronary artery without angina pectoris: Secondary | ICD-10-CM | POA: Insufficient documentation

## 2023-01-01 DIAGNOSIS — R931 Abnormal findings on diagnostic imaging of heart and coronary circulation: Secondary | ICD-10-CM

## 2023-01-05 ENCOUNTER — Encounter: Payer: Self-pay | Admitting: Family Medicine

## 2023-01-05 ENCOUNTER — Ambulatory Visit: Payer: Commercial Managed Care - PPO | Admitting: Family Medicine

## 2023-01-05 VITALS — BP 128/82 | HR 60 | Temp 97.8°F | Ht 69.0 in | Wt 175.0 lb

## 2023-01-05 DIAGNOSIS — E785 Hyperlipidemia, unspecified: Secondary | ICD-10-CM | POA: Diagnosis not present

## 2023-01-05 DIAGNOSIS — Z23 Encounter for immunization: Secondary | ICD-10-CM | POA: Diagnosis not present

## 2023-01-05 DIAGNOSIS — I10 Essential (primary) hypertension: Secondary | ICD-10-CM

## 2023-01-05 DIAGNOSIS — Z0000 Encounter for general adult medical examination without abnormal findings: Secondary | ICD-10-CM | POA: Diagnosis not present

## 2023-01-05 DIAGNOSIS — I2583 Coronary atherosclerosis due to lipid rich plaque: Secondary | ICD-10-CM | POA: Diagnosis not present

## 2023-01-05 DIAGNOSIS — Z1211 Encounter for screening for malignant neoplasm of colon: Secondary | ICD-10-CM

## 2023-01-05 DIAGNOSIS — N1831 Chronic kidney disease, stage 3a: Secondary | ICD-10-CM | POA: Diagnosis not present

## 2023-01-05 DIAGNOSIS — Z0001 Encounter for general adult medical examination with abnormal findings: Secondary | ICD-10-CM

## 2023-01-05 DIAGNOSIS — E118 Type 2 diabetes mellitus with unspecified complications: Secondary | ICD-10-CM | POA: Diagnosis not present

## 2023-01-05 DIAGNOSIS — I2510 Atherosclerotic heart disease of native coronary artery without angina pectoris: Secondary | ICD-10-CM | POA: Diagnosis not present

## 2023-01-05 DIAGNOSIS — K7581 Nonalcoholic steatohepatitis (NASH): Secondary | ICD-10-CM

## 2023-01-05 DIAGNOSIS — Z7984 Long term (current) use of oral hypoglycemic drugs: Secondary | ICD-10-CM

## 2023-01-05 LAB — HEPATIC FUNCTION PANEL
ALT: 28 U/L (ref 0–53)
AST: 23 U/L (ref 0–37)
Albumin: 4.6 g/dL (ref 3.5–5.2)
Alkaline Phosphatase: 70 U/L (ref 39–117)
Bilirubin, Direct: 0.3 mg/dL (ref 0.0–0.3)
Total Bilirubin: 1.6 mg/dL — ABNORMAL HIGH (ref 0.2–1.2)
Total Protein: 7.4 g/dL (ref 6.0–8.3)

## 2023-01-05 LAB — CBC WITH DIFFERENTIAL/PLATELET
Basophils Absolute: 0.1 10*3/uL (ref 0.0–0.1)
Basophils Relative: 1.1 % (ref 0.0–3.0)
Eosinophils Absolute: 0.2 10*3/uL (ref 0.0–0.7)
Eosinophils Relative: 3.3 % (ref 0.0–5.0)
HCT: 48.4 % (ref 39.0–52.0)
Hemoglobin: 16.1 g/dL (ref 13.0–17.0)
Lymphocytes Relative: 29.6 % (ref 12.0–46.0)
Lymphs Abs: 2 10*3/uL (ref 0.7–4.0)
MCHC: 33.3 g/dL (ref 30.0–36.0)
MCV: 93.1 fL (ref 78.0–100.0)
Monocytes Absolute: 0.6 10*3/uL (ref 0.1–1.0)
Monocytes Relative: 8.8 % (ref 3.0–12.0)
Neutro Abs: 3.9 10*3/uL (ref 1.4–7.7)
Neutrophils Relative %: 57.2 % (ref 43.0–77.0)
Platelets: 207 10*3/uL (ref 150.0–400.0)
RBC: 5.2 Mil/uL (ref 4.22–5.81)
RDW: 13.8 % (ref 11.5–15.5)
WBC: 6.8 10*3/uL (ref 4.0–10.5)

## 2023-01-05 LAB — LIPID PANEL
Cholesterol: 99 mg/dL (ref 0–200)
HDL: 37.2 mg/dL — ABNORMAL LOW (ref >39.00)
LDL Cholesterol: 42 mg/dL (ref 0–99)
NonHDL: 62.25
Total CHOL/HDL Ratio: 3
Triglycerides: 99 mg/dL (ref 0.0–149.0)
VLDL: 19.8 mg/dL (ref 0.0–40.0)

## 2023-01-05 LAB — BASIC METABOLIC PANEL
BUN: 16 mg/dL (ref 6–23)
CO2: 29 meq/L (ref 19–32)
Calcium: 9.4 mg/dL (ref 8.4–10.5)
Chloride: 108 meq/L (ref 96–112)
Creatinine, Ser: 1.37 mg/dL (ref 0.40–1.50)
GFR: 50.74 mL/min — ABNORMAL LOW (ref >60.00)
Glucose, Bld: 105 mg/dL — ABNORMAL HIGH (ref 70–99)
Potassium: 4 meq/L (ref 3.5–5.1)
Sodium: 143 meq/L (ref 135–145)

## 2023-01-05 LAB — HEMOGLOBIN A1C: Hgb A1c MFr Bld: 6.6 % — ABNORMAL HIGH (ref 4.6–6.5)

## 2023-01-05 NOTE — Assessment & Plan Note (Signed)
He does not check BS at home.  Continue current medication regimen. Check A1c and follow up.

## 2023-01-05 NOTE — Assessment & Plan Note (Signed)
Continue medication regimen and low fat diet.

## 2023-01-05 NOTE — Assessment & Plan Note (Signed)
Controlled. Continue current medication regimen and low sodium diet. Followed by nephrology annually

## 2023-01-05 NOTE — Assessment & Plan Note (Addendum)
Preventive health care reviewed.  Counseling on healthy lifestyle including diet and exercise.  Recommend regular dental and eye exams.  Immunizations reviewed.  No issues with ADLs. He is still working.  No memory concerns.  Due for colon cancer screening and referral made to [facility] [employer] form will be filled out and he will pick it up once I have his lipid panel

## 2023-01-05 NOTE — Assessment & Plan Note (Signed)
Check hepatic function. Recommend low fat diet.

## 2023-01-05 NOTE — Assessment & Plan Note (Signed)
Reviewed recent note from nephrology. Renal function is stable. He is aware of need to control HTN, DM, and HLD and avoid nephrotoxic drugs.

## 2023-01-05 NOTE — Progress Notes (Signed)
Complete physical exam  Patient: Brett Robles   DOB: 08-13-1950   72 y.o. Male  MRN: [id number]  Subjective:    Chief Complaint  Patient presents with   Annual Exam    fasting   He is here for a complete physical exam. He is a patient of Dr. Yetta Barre and this is my first visit with him.   He has a form to send to his employer and he wants to pick this up.   States he had lifeline screening done November 13th. He has the form. All testing was negative/normal.   He sees [facility] annually for labs. Last there in November 2024 Dr. Dione Booze- eyes. Last there in October 2024.  Hx of injury to his right eye (piece of wood).   He has been referred to [facility] by his PCP but has not heard from them yet.   Taking iron and pantoprazole for hx of ulcer and GI bleeding.    Diet- hungry all the time.   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 No additional weekly exercise. Biking on the weekends.  Married  He has 6 children and 7 grandchildren.     Health Maintenance  Topic Date Due   Colon Cancer Screening  06/29/2022   Eye exam for diabetics  11/14/2022   Hemoglobin A1C  02/25/2023   Yearly kidney function blood test for diabetes  08/25/2023   Yearly kidney health urinalysis for diabetes  08/25/2023   Complete foot exam   01/05/2024   DTaP/Tdap/Td vaccine (3 - Td or Tdap) 08/10/2030   Pneumonia Vaccine  Completed   Flu Shot  Completed   Hepatitis C Screening  Completed   Zoster (Shingles) Vaccine  Completed   HPV Vaccine  Aged Out   COVID-19 Vaccine  Discontinued    Wears seatbelt always, uses sunscreen, smoke detectors in home and functioning, does not text while driving, feels safe in home environment.  Depression screening:    01/05/2023    8:40 AM 12/29/2021    9:31 AM 12/24/2020   10:25 AM  Depression screen PHQ 2/9  Decreased Interest 0 0 0  Down, Depressed, Hopeless 0 0 0  PHQ - 2 Score 0 0 0  Altered sleeping  0   Tired,  decreased energy  0   Change in appetite  0   Feeling bad or failure about yourself   0   Trouble concentrating  0   Moving slowly or fidgety/restless  0   Suicidal thoughts  0   PHQ-9 Score  0    Anxiety Screening:     No data to display            Patient Active Problem List   Diagnosis Date Noted   Coronary artery disease due to lipid rich plaque 01/01/2023   Agatston CAC score, >400 09/29/2022   Recurrent cold sores 08/08/2020   NASH (nonalcoholic steatohepatitis) 12/13/2018   Chronic renal disease, stage 3, moderately decreased glomerular filtration rate (GFR) between 30-59 mL/min/1.73 square meter (HCC) 08/30/2018   Type II diabetes mellitus with manifestations (HCC) 12/07/2017   Primary osteoarthritis involving multiple joints 12/01/2016   Asthma, mild intermittent 11/25/2015   BPH (benign prostatic hyperplasia) 11/19/2014   Hyperlipidemia with target LDL less than 130 11/19/2014   Essential hypertension, benign 10/10/2012   Encounter for general adult medical examination with abnormal findings 10/10/2012   Erectile dysfunction 10/10/2012   Duodenal ulcer due to nonsteroidal anti-inflammatory drug (  NSAID) 06/19/2012   Past Medical History:  Diagnosis Date   Adenomatous colon polyp 04/27/2006   Allergy Every spring & fall   Arthritis Comes and goes   Asthma    Gastric ulcer    Glaucoma    High cholesterol    Hypertension    Internal hemorrhoids    Past Surgical History:  Procedure Laterality Date   COLONOSCOPY  01/26/2006   COLONOSCOPY N/A 06/27/2012   Procedure: COLONOSCOPY;  Surgeon: Hart Carwin, MD;  Location: [facility] ENDOSCOPY;  Service: Endoscopy;  Laterality: N/A;   ENTEROSCOPY N/A 06/26/2012   Procedure: ENTEROSCOPY;  Surgeon: Rachael Fee, MD;  Location[facility] ENDOSCOPY;  Service: Endoscopy;  Laterality: N/A;   ESOPHAGOGASTRODUODENOSCOPY N/A 06/19/2012   Procedure: ESOPHAGOGASTRODUODENOSCOPY (EGD);  Surgeon: Hart Carwin, MD;  Location[facility] ENDOSCOPY;   Service: Endoscopy;  Laterality: N/A;   EYE SURGERY     @ 14 on rt eye   Social History   Tobacco Use   Smoking status: Never   Smokeless tobacco: Never   Tobacco comments:    Never have been a smoker  Substance Use Topics   Alcohol use: Never   Drug use: Never      Patient Care Team: Etta Grandchild, MD as PCP - General (Internal Medicine) [facility]   Outpatient Medications Prior to Visit  Medication Sig   amLODipine (NORVASC) 10 MG tablet TAKE 1 TABLET BY MOUTH EVERY DAY   aspirin EC 81 MG tablet Take 1 tablet (81 mg total) by mouth daily.   chlorhexidine (PERIDEX) 0.12 % solution SMARTSIG:By Mouth   dorzolamide-timolol (COSOPT) 22.3-6.8 MG/ML ophthalmic solution Place 1 drop into the right eye 2 (two) times daily.   empagliflozin (JARDIANCE) 25 MG TABS tablet Take 1 tablet (25 mg total) by mouth daily before breakfast.   olmesartan (BENICAR) 40 MG tablet TAKE 1 TABLET BY MOUTH EVERY DAY   pantoprazole (PROTONIX) 40 MG tablet TAKE 1 TABLET BY MOUTH EVERY DAY   rosuvastatin (CRESTOR) 10 MG tablet TAKE 1 TABLET BY MOUTH EVERY DAY   No facility-administered medications prior to visit.    Review of Systems  Constitutional:  Negative for chills, fever, malaise/fatigue and weight loss.  HENT:  Negative for congestion, ear pain, sinus pain and sore throat.   Eyes:  Negative for blurred vision, double vision and pain.  Respiratory:  Negative for cough, shortness of breath and wheezing.   Cardiovascular:  Negative for chest pain, palpitations and leg swelling.  Gastrointestinal:  Negative for abdominal pain, constipation, diarrhea, nausea and vomiting.  Genitourinary:  Negative for dysuria, frequency and urgency.  Musculoskeletal:  Negative for back pain, joint pain and myalgias.  Skin:  Negative for rash.  Neurological:  Negative for dizziness, tingling, focal weakness and headaches.  Psychiatric/Behavioral:  Negative for depression, memory loss and  suicidal ideas. The patient is not nervous/anxious.        Objective:    BP 128/82 (BP Location: Left Arm, Patient Position: Sitting, Cuff Size: Large)   Pulse 60   Temp 97.8 F (36.6 C) (Temporal)   Ht 5\' 9"  (1.753 m)   Wt 175 lb (79.4 kg)   SpO2 97%   BMI 25.84 kg/m  BP Readings from Last 3 Encounters:  01/05/23 128/82  08/25/22 118/66  02/20/22 132/76   Wt Readings from Last 3 Encounters:  01/05/23 175 lb (79.4 kg)  08/25/22 174 lb (78.9 kg)  02/20/22 173 lb (78.5 kg)    Physical Exam Constitutional:  General: He is not in acute distress.    Appearance: He is not ill-appearing.  HENT:     Right Ear: Tympanic membrane, ear canal and external ear normal.     Left Ear: Tympanic membrane, ear canal and external ear normal.     Nose: Nose normal.     Mouth/Throat:     Mouth: Mucous membranes are moist.     Pharynx: Oropharynx is clear.  Eyes:     Extraocular Movements: Extraocular movements intact.     Conjunctiva/sclera: Conjunctivae normal.     Pupils: Pupils are equal, round, and reactive to light.  Neck:     Thyroid: No thyroid mass, thyromegaly or thyroid tenderness.  Cardiovascular:     Rate and Rhythm: Normal rate and regular rhythm.     Pulses: Normal pulses.     Heart sounds: Normal heart sounds.  Pulmonary:     Effort: Pulmonary effort is normal.     Breath sounds: Normal breath sounds.  Abdominal:     General: Bowel sounds are normal. There is no distension.     Palpations: Abdomen is soft.     Tenderness: There is no abdominal tenderness. There is no right CVA tenderness, left CVA tenderness, guarding or rebound.  Musculoskeletal:        General: Normal range of motion.     Cervical back: Normal range of motion and neck supple. No tenderness.     Right lower leg: No edema.     Left lower leg: No edema.  Lymphadenopathy:     Cervical: No cervical adenopathy.  Skin:    General: Skin is warm and dry.     Findings: No lesion or rash.   Neurological:     General: No focal deficit present.     Mental Status: He is alert and oriented to person, place, and time.     Cranial Nerves: No cranial nerve deficit.     Sensory: No sensory deficit.     Motor: No weakness.  Psychiatric:        Mood and Affect: Mood normal.        Behavior: Behavior normal.        Thought Content: Thought content normal.      No results found for any visits on 01/05/23.    Assessment & Plan:    Routine Health Maintenance and Physical Exam  Problem List Items Addressed This Visit     Chronic renal disease, stage 3, moderately decreased glomerular filtration rate (GFR) between 30-59 mL/min/1.73 square meter (HCC)    Reviewed recent note from nephrology. Renal function is stable. He is aware of need to control HTN, DM, and HLD and avoid nephrotoxic drugs.       Coronary artery disease due to lipid rich plaque    He has been referred to cardiology by PCP.       Relevant Orders   Lipid panel   Encounter for general adult medical examination with abnormal findings - Primary    Preventive health care reviewed.  Counseling on healthy lifestyle including diet and exercise.  Recommend regular dental and eye exams.  Immunizations reviewed.  No issues with ADLs. He is still working.  No memory concerns.  Due for colon cancer screening and referral made to [facility] Employer form will be filled out and he will pick it up once I have his lipid panel          Essential hypertension, benign    Controlled. Continue current  medication regimen and low sodium diet. Followed by nephrology annually       Relevant Orders   CBC with Differential/Platelet   Basic metabolic panel   Hyperlipidemia with target LDL less than 130    Continue medication regimen and low fat diet.       Relevant Orders   Lipid panel   NASH (nonalcoholic steatohepatitis)    Check hepatic function. Recommend low fat diet.       Relevant Orders   CBC with  Differential/Platelet   Hepatic function panel   Type II diabetes mellitus with manifestations (HCC)    He does not check BS at home.  Continue current medication regimen. Check A1c and follow up.       Relevant Orders   CBC with Differential/Platelet   Hemoglobin A1c   Other Visit Diagnoses     Need for influenza vaccination       Relevant Orders   Flu Vaccine Trivalent High Dose (Fluad) (Completed)   Screen for colon cancer       Relevant Orders   Ambulatory referral to Gastroenterology       Return in about 6 months (around 07/06/2023) for schedule with Dr. Yetta Barre please .     Hetty Blend, NP-C

## 2023-01-05 NOTE — Patient Instructions (Addendum)
Please go downstairs for labs before you leave.   You have been referred to cardiology. You can call to schedule.   [facility] [phone]

## 2023-01-05 NOTE — Assessment & Plan Note (Signed)
He has been referred to cardiology by PCP.

## 2023-01-06 NOTE — Progress Notes (Signed)
Brett Robles, please fill in cholesterol results on his form and he can pick it up.

## 2023-01-08 ENCOUNTER — Other Ambulatory Visit: Payer: Self-pay | Admitting: Internal Medicine

## 2023-01-08 DIAGNOSIS — I10 Essential (primary) hypertension: Secondary | ICD-10-CM

## 2023-01-08 MED ORDER — OLMESARTAN MEDOXOMIL 40 MG PO TABS: 40.0000 mg | ORAL_TABLET | Freq: Every day | ORAL | 0 refills | Status: DC

## 2023-02-06 ENCOUNTER — Other Ambulatory Visit: Payer: Self-pay | Admitting: Internal Medicine

## 2023-02-06 DIAGNOSIS — I10 Essential (primary) hypertension: Secondary | ICD-10-CM

## 2023-02-09 ENCOUNTER — Other Ambulatory Visit: Payer: Self-pay

## 2023-02-09 ENCOUNTER — Other Ambulatory Visit: Payer: Self-pay | Admitting: Internal Medicine

## 2023-02-09 DIAGNOSIS — N1831 Chronic kidney disease, stage 3a: Secondary | ICD-10-CM

## 2023-02-09 DIAGNOSIS — E118 Type 2 diabetes mellitus with unspecified complications: Secondary | ICD-10-CM

## 2023-02-09 MED ORDER — EMPAGLIFLOZIN 25 MG PO TABS: 25.0000 mg | ORAL_TABLET | Freq: Every day | ORAL | 0 refills | Status: DC

## 2023-02-10 ENCOUNTER — Other Ambulatory Visit: Payer: Self-pay | Admitting: Internal Medicine

## 2023-02-10 DIAGNOSIS — E785 Hyperlipidemia, unspecified: Secondary | ICD-10-CM

## 2023-02-10 DIAGNOSIS — K269 Duodenal ulcer, unspecified as acute or chronic, without hemorrhage or perforation: Secondary | ICD-10-CM

## 2023-02-10 MED ORDER — PANTOPRAZOLE SODIUM 40 MG PO TBEC: 40.0000 mg | DELAYED_RELEASE_TABLET | Freq: Every day | ORAL | 0 refills | Status: DC

## 2023-02-10 MED ORDER — ROSUVASTATIN CALCIUM 10 MG PO TABS: 10.0000 mg | ORAL_TABLET | Freq: Every day | ORAL | 0 refills | Status: DC

## 2023-03-04 ENCOUNTER — Other Ambulatory Visit: Payer: Self-pay | Admitting: Internal Medicine

## 2023-03-04 DIAGNOSIS — I10 Essential (primary) hypertension: Secondary | ICD-10-CM

## 2023-03-04 MED ORDER — AMLODIPINE BESYLATE 10 MG PO TABS: 10.0000 mg | ORAL_TABLET | Freq: Every day | ORAL | 0 refills | Status: DC

## 2023-03-21 NOTE — Progress Notes (Unsigned)
 Cardiology Office Note:  .   Date:  03/22/2023  ID:  Brett Robles, DOB [birth year], MRN [id number] PCP: Brett Grandchild, MD  [facility] Providers Cardiologist:  Yates Decamp, MD   History of Present Illness: .   Brett Robles is a 73 y.o. Patient with history of GI bleed remotely presently on PPI, diabetes mellitus with stage III chronic kidney disease, primary hypertension, hypercholesterolemia, NASH referred for cardiac evaluation in view of elevated coronary calcium score.  He is asymptomatic and continues to remain very active.  Discussed the use of AI scribe software for clinical note transcription with the patient, who gave verbal consent to proceed.  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 Sanda Linger due to a high coronary calcium score of 447. The patient is active, enjoys fishing, hiking, and biking, and continues to work full time. He reports feeling younger than his age, with occasional age-related aches and pains. His weight has been stable for years, ranging between 173 and 176 pounds. He describes himself as a healthy eater, avoiding sugary drinks, but admits to having a sweet tooth. He has never smoked or consumed alcohol.  About a month ago, the patient experienced pain in his left arm, which was initially suspected to be a blood clot. However, after an ultrasound, the diagnosis was tennis elbow. Despite this, the patient still experiences occasional neuropathy and electrical pulses running up and down the arm, which worsen when he leans his head back.  The patient's blood pressure is well controlled, typically around 130/82 at home. He has been diagnosed as borderline diabetic and has been on Jardiance for about a year. He also has mild kidney disease due to diabetes.      Labs   Lab Results  Component Value Date   CHOL 99 01/05/2023   HDL 37.20 (L) 01/05/2023   LDLCALC 42 01/05/2023   TRIG 99.0 01/05/2023    CHOLHDL 3 01/05/2023   Lab Results  Component Value Date   NA 143 01/05/2023   K 4.0 01/05/2023   CO2 29 01/05/2023   GLUCOSE 105 (H) 01/05/2023   BUN 16 01/05/2023   CREATININE 1.37 01/05/2023   CALCIUM 9.4 01/05/2023   GFR 50.74 (L) 01/05/2023   GFRNONAA 68 (L) 06/26/2012      Latest Ref Rng & Units 01/05/2023    9:38 AM 08/25/2022    9:02 AM 02/20/2022   11:21 AM  BMP  Glucose 70 - 99 mg/dL 308  99  657   BUN 6 - 23 mg/dL 16  30  29    Creatinine 0.40 - 1.50 mg/dL 8.46  9.62  9.52   Sodium 135 - 145 mEq/L 143  140  140   Potassium 3.5 - 5.1 mEq/L 4.0  3.7  4.3   Chloride 96 - 112 mEq/L 108  103  103   CO2 19 - 32 mEq/L 29  29  30    Calcium 8.4 - 10.5 mg/dL 9.4  9.6  9.4       Latest Ref Rng & Units 01/05/2023    9:38 AM 08/25/2022    9:02 AM 10/06/2021    4:02 PM  CBC  WBC 4.0 - 10.5 K/uL 6.8  7.4  8.0   Hemoglobin 13.0 - 17.0 g/dL 84.1  32.4  40.1   Hematocrit 39.0 - 52.0 % 48.4  44.7  40.7   Platelets 150.0 - 400.0 K/uL 207.0  217.0  194.0    Lab Results  Component Value Date   HGBA1C 6.6 (H) 01/05/2023    Lab Results  Component Value Date   TSH 1.82 12/29/2021    Review of Systems  Cardiovascular:  Negative for chest pain, dyspnea on exertion and leg swelling.   Physical Exam:   VS:  BP 130/80 (BP Location: Left Arm, Patient Position: Sitting, Cuff Size: Normal)   Pulse 77   Resp 16   Ht 5\' 9"  (1.753 m)   Wt 176 lb 6.4 oz (80 kg)   SpO2 97%   BMI 26.05 kg/m    Wt Readings from Last 3 Encounters:  03/22/23 176 lb 6.4 oz (80 kg)  01/05/23 175 lb (79.4 kg)  08/25/22 174 lb (78.9 kg)    Physical Exam Neck:     Vascular: No carotid bruit or JVD.  Cardiovascular:     Rate and Rhythm: Normal rate and regular rhythm.     Pulses: Intact distal pulses.     Heart sounds: Normal heart sounds. No murmur heard.    No gallop.  Pulmonary:     Effort: Pulmonary effort is normal.     Breath sounds: Normal breath sounds.  Abdominal:     General: Bowel sounds  are normal.     Palpations: Abdomen is soft.  Musculoskeletal:     Right lower leg: No edema.     Left lower leg: No edema.    Studies Reviewed: Marland Kitchen    Coronary calcium score  09/22/22: Total score 447, Patient in 64th MESA database percentile LM: 0 LAD: 270 LCx: 146 RCA: 31 Visualized ascending and descending aorta Normal size. Aortic atherosclerosis. Extracardiac abnormalities:  Central left lower lobe 1.6 cm nodule minimally enlarged compared to 2014.  EKG:    EKG Interpretation Date/Time:  Monday March 22 2023 08:49:58 EST Ventricular Rate:  76 PR Interval:  174 QRS Duration:  90 QT Interval:  366 QTC Calculation: 411 R Axis:   -1  Text Interpretation: EKG 03/22/2023: Normal sinus rhythm at rate of 76 bpm, poor R progression, probably normal.  Compared to 06/25/2012, no significant change. Confirmed by Delrae Rend [phone]) on 03/22/2023 9:01:41 AM    Medications and allergies    Allergies  Allergen Reactions   Pravachol [Pravastatin Sodium]     Muscle aches     Current Outpatient Medications:    amLODipine (NORVASC) 10 MG tablet, Take 1 tablet (10 mg total) by mouth daily., Disp: 90 tablet, Rfl: 0   aspirin EC 81 MG tablet, Take 1 tablet (81 mg total) by mouth daily., Disp: 90 tablet, Rfl: 1   chlorhexidine (PERIDEX) 0.12 % solution, SMARTSIG:By Mouth, Disp: , Rfl:    cholecalciferol (VITAMIN D3) 25 MCG (1000 UNIT) tablet, Take 1,000 Units by mouth daily., Disp: , Rfl:    dorzolamide-timolol (COSOPT) 22.3-6.8 MG/ML ophthalmic solution, Place 1 drop into both eyes 2 (two) times daily., Disp: , Rfl:    empagliflozin (JARDIANCE) 25 MG TABS tablet, Take 1 tablet (25 mg total) by mouth daily before breakfast., Disp: 90 tablet, Rfl: 0   fexofenadine (ALLEGRA) 180 MG tablet, Take 180 mg by mouth daily., Disp: , Rfl:    Multiple Vitamin (MULTIVITAMIN ADULT PO), Take 1 capsule by mouth daily., Disp: , Rfl:    olmesartan (BENICAR) 40 MG tablet, Take 1 tablet (40 mg total)  by mouth daily., Disp: 90 tablet, Rfl: 0   pantoprazole (PROTONIX) 40 MG tablet, Take 1 tablet (40 mg total) by mouth daily., Disp:  90 tablet, Rfl: 0   rosuvastatin (CRESTOR) 10 MG tablet, Take 1 tablet (10 mg total) by mouth daily., Disp: 90 tablet, Rfl: 0   ASSESSMENT AND PLAN: .      ICD-10-CM   1. Elevated coronary artery calcium score 09/22/22: Total score 447, Patient in 64th MESA database percentile  R93.1 EKG 12-Lead    Cardiac Stress Test: Informed Consent Details: Physician/Practitioner Attestation; Transcribe to consent form and obtain patient signature    EXERCISE TOLERANCE TEST (ETT)    2. Essential hypertension, benign  I10 EXERCISE TOLERANCE TEST (ETT)    3. Pure hypercholesterolemia  E78.00 EXERCISE TOLERANCE TEST (ETT)    4. Type 2 diabetes mellitus with stage 3a chronic kidney disease, without long-term current use of insulin (HCC)  E11.22 Cardiac Stress Test: Informed Consent Details: Physician/Practitioner Attestation; Transcribe to consent form and obtain patient signature   N18.31      Assessment and Plan    Abnormal Coronary Calcium Score   The coronary calcium score is 447, placing it in the 64th percentile for age, sex, and race, indicating moderate risk. There are no symptoms, and risk modification therapy for hypertension and hyperlipidemia is appropriate. Heart attacks cannot be entirely prevented by tests or surgeries, and current medical management is sufficient, he is on best medical therapy. Order a treadmill exercise stress test. Continue baby aspirin and rosuvastatin 10 mg daily.  Hypertension   Blood pressure is well-controlled with current medications, with home readings typically at 130/82 mmHg. Continue amlodipine 10 mg daily and olmesartan 40 mg daily.  If diastolic blood pressure is >80 mmHg, could consider changing olmesartan to olmesartan HCT 40/12.5 mg in the morning.  He is also on cardioprotective and renal protective Jardiance as well for  diabetes.  Diabetes Mellitus   Mild diabetes is present with an A1c of 6.6%. London Pepper is providing cardiovascular and renal protection. Continue Jardiance 10 mg daily.  Mild Chronic Kidney Disease   This is secondary to diabetes, with a creatinine level of 1.4 mg/dL. Continue current management and monitoring.  Neuropathy   There is intermittent neuropathic pain in the arm, possibly related to a pinched nerve or degenerative spine disease, with symptoms exacerbated by neck extension. Consider using a band for tennis elbow. Monitor symptoms and consider further evaluation if he persists.  General Health Maintenance   An active lifestyle is maintained, with abstinence from alcohol and no history of smoking. A healthy diet is followed. Encourage continued healthy lifestyle and regular physical activity.  Follow-up as needed.   Signed,  Yates Decamp, MD, [facility] 03/22/2023, 9:32 AM [facility] [address] [place], [place] [address] Phone: [phone]. Fax:  [phone]

## 2023-03-22 ENCOUNTER — Ambulatory Visit: Payer: Commercial Managed Care - PPO | Attending: Cardiology | Admitting: Cardiology

## 2023-03-22 ENCOUNTER — Encounter: Payer: Self-pay | Admitting: Cardiology

## 2023-03-22 VITALS — BP 130/80 | HR 77 | Resp 16 | Ht 69.0 in | Wt 176.4 lb

## 2023-03-22 DIAGNOSIS — E1122 Type 2 diabetes mellitus with diabetic chronic kidney disease: Secondary | ICD-10-CM | POA: Diagnosis not present

## 2023-03-22 DIAGNOSIS — I10 Essential (primary) hypertension: Secondary | ICD-10-CM

## 2023-03-22 DIAGNOSIS — R931 Abnormal findings on diagnostic imaging of heart and coronary circulation: Secondary | ICD-10-CM

## 2023-03-22 DIAGNOSIS — E7800 Pure hypercholesterolemia, unspecified: Secondary | ICD-10-CM | POA: Diagnosis not present

## 2023-03-22 DIAGNOSIS — N1831 Chronic kidney disease, stage 3a: Secondary | ICD-10-CM

## 2023-03-22 NOTE — Patient Instructions (Addendum)
 Medication Instructions:  Your physician recommends that you continue on your current medications as directed. Please refer to the Current Medication list given to you today.  *If you need a refill on your cardiac medications before your next appointment, please call your pharmacy*   Lab Work: none If you have labs (blood work) drawn today and your tests are completely normal, you will receive your results only by: MyChart Message (if you have MyChart) OR A paper copy in the mail If you have any lab test that is abnormal or we need to change your treatment, we will call you to review the results.   Testing/Procedures: Your physician has requested that you have an exercise tolerance test. For further information please visit [email or web address] Please also follow instruction sheet, as given.    Follow-Up: At [facility], you and your health needs are our priority.  As part of our continuing mission to provide you with exceptional heart care, we have created designated Provider Care Teams.  These Care Teams include your primary Cardiologist (physician) and Advanced Practice Providers (APPs -  Physician Assistants and Nurse Practitioners) who all work together to provide you with the care you need, when you need it.  We recommend signing up for the patient portal called "MyChart".  Sign up information is provided on this After Visit Summary.  MyChart is used to connect with patients for Virtual Visits (Telemedicine).  Patients are able to view lab/test results, encounter notes, upcoming appointments, etc.  Non-urgent messages can be sent to your provider as well.   To learn more about what you can do with MyChart, go to [email or web address].    Your next appointment:   As needed  Provider:   Yates Decamp, MD     Other Instructions   You are scheduled for an Exercise Stress Test on ____ at _____ am/pm.   Please arrive 15 minutes prior to your appointment time to allow for  registration and insurance purposes.  The test will take approximately 45 minutes to complete.  How to prepare for your Exercise Stress Test: - Do bring a list of your current medications with you. If you do not take any of the medications listed below, you may take your medications as normal the day of the test. -May take all your normal medications . - DO wear comfortable clothes (no dresses or overalls) and walking shoes, tennis shoes preferred (no heels or open toed shoes allowed). - Please report to [facility] at [address], [place], [place] [address] for your test       [phone]   If these instructions are not followed as listed above, your test will be rescheduled at a later date.  If you can not keep you appointment, please provide 24 hours notification to the Stress Lab to avoid a possible $50 charge to your patient account.

## 2023-04-05 ENCOUNTER — Other Ambulatory Visit: Payer: Self-pay | Admitting: Internal Medicine

## 2023-04-05 DIAGNOSIS — I10 Essential (primary) hypertension: Secondary | ICD-10-CM

## 2023-04-15 ENCOUNTER — Ambulatory Visit: Payer: Commercial Managed Care - PPO

## 2023-05-03 ENCOUNTER — Telehealth (HOSPITAL_COMMUNITY): Payer: Self-pay | Admitting: Cardiology

## 2023-05-03 NOTE — Telephone Encounter (Signed)
 I called to reschedule GXT and patient did not wish to reschedule at this time due to his work schedule. Order will be removed from the WQ and if patient calls back to reschedule we will reinstate the order. Thank you.

## 2023-05-08 ENCOUNTER — Other Ambulatory Visit: Payer: Self-pay | Admitting: Internal Medicine

## 2023-05-08 DIAGNOSIS — K269 Duodenal ulcer, unspecified as acute or chronic, without hemorrhage or perforation: Secondary | ICD-10-CM

## 2023-05-08 DIAGNOSIS — E785 Hyperlipidemia, unspecified: Secondary | ICD-10-CM

## 2023-05-08 DIAGNOSIS — E118 Type 2 diabetes mellitus with unspecified complications: Secondary | ICD-10-CM

## 2023-05-08 DIAGNOSIS — N1831 Chronic kidney disease, stage 3a: Secondary | ICD-10-CM

## 2023-05-12 ENCOUNTER — Other Ambulatory Visit: Payer: Self-pay | Admitting: Internal Medicine

## 2023-05-12 DIAGNOSIS — K269 Duodenal ulcer, unspecified as acute or chronic, without hemorrhage or perforation: Secondary | ICD-10-CM

## 2023-05-12 DIAGNOSIS — E785 Hyperlipidemia, unspecified: Secondary | ICD-10-CM

## 2023-05-12 DIAGNOSIS — N1831 Chronic kidney disease, stage 3a: Secondary | ICD-10-CM

## 2023-05-12 DIAGNOSIS — E118 Type 2 diabetes mellitus with unspecified complications: Secondary | ICD-10-CM

## 2023-05-13 ENCOUNTER — Other Ambulatory Visit: Payer: Self-pay | Admitting: Internal Medicine

## 2023-05-13 DIAGNOSIS — E118 Type 2 diabetes mellitus with unspecified complications: Secondary | ICD-10-CM

## 2023-05-13 DIAGNOSIS — E785 Hyperlipidemia, unspecified: Secondary | ICD-10-CM

## 2023-05-13 DIAGNOSIS — K269 Duodenal ulcer, unspecified as acute or chronic, without hemorrhage or perforation: Secondary | ICD-10-CM

## 2023-05-13 DIAGNOSIS — N1831 Chronic kidney disease, stage 3a: Secondary | ICD-10-CM

## 2023-05-13 MED ORDER — ROSUVASTATIN CALCIUM 10 MG PO TABS: 10.0000 mg | ORAL_TABLET | Freq: Every day | ORAL | 0 refills | Status: DC

## 2023-05-13 MED ORDER — EMPAGLIFLOZIN 25 MG PO TABS: 25.0000 mg | ORAL_TABLET | Freq: Every day | ORAL | 0 refills | Status: DC

## 2023-05-13 MED ORDER — PANTOPRAZOLE SODIUM 40 MG PO TBEC: 40.0000 mg | DELAYED_RELEASE_TABLET | Freq: Every day | ORAL | 0 refills | Status: DC

## 2023-05-23 ENCOUNTER — Other Ambulatory Visit: Payer: Self-pay | Admitting: Internal Medicine

## 2023-05-23 DIAGNOSIS — I10 Essential (primary) hypertension: Secondary | ICD-10-CM

## 2023-05-24 MED ORDER — AMLODIPINE BESYLATE 10 MG PO TABS: 10.0000 mg | ORAL_TABLET | Freq: Every day | ORAL | 0 refills | Status: DC

## 2023-06-04 LAB — HM DIABETES EYE EXAM

## 2023-06-07 ENCOUNTER — Encounter: Payer: Self-pay | Admitting: Internal Medicine

## 2023-06-07 ENCOUNTER — Ambulatory Visit: Admitting: Internal Medicine

## 2023-06-07 VITALS — BP 132/80 | HR 58 | Temp 98.0°F | Resp 16 | Ht 69.0 in | Wt 172.0 lb

## 2023-06-07 DIAGNOSIS — Z1211 Encounter for screening for malignant neoplasm of colon: Secondary | ICD-10-CM

## 2023-06-07 DIAGNOSIS — I10 Essential (primary) hypertension: Secondary | ICD-10-CM | POA: Diagnosis not present

## 2023-06-07 DIAGNOSIS — E785 Hyperlipidemia, unspecified: Secondary | ICD-10-CM

## 2023-06-07 DIAGNOSIS — R972 Elevated prostate specific antigen [PSA]: Secondary | ICD-10-CM

## 2023-06-07 DIAGNOSIS — N400 Enlarged prostate without lower urinary tract symptoms: Secondary | ICD-10-CM | POA: Diagnosis not present

## 2023-06-07 DIAGNOSIS — E118 Type 2 diabetes mellitus with unspecified complications: Secondary | ICD-10-CM | POA: Diagnosis not present

## 2023-06-07 DIAGNOSIS — N1831 Chronic kidney disease, stage 3a: Secondary | ICD-10-CM

## 2023-06-07 LAB — CBC WITH DIFFERENTIAL/PLATELET
Basophils Absolute: 0 10*3/uL (ref 0.0–0.1)
Basophils Relative: 0.5 % (ref 0.0–3.0)
Eosinophils Absolute: 0.3 10*3/uL (ref 0.0–0.7)
Eosinophils Relative: 3.1 % (ref 0.0–5.0)
HCT: 47.8 % (ref 39.0–52.0)
Hemoglobin: 16 g/dL (ref 13.0–17.0)
Lymphocytes Relative: 34.5 % (ref 12.0–46.0)
Lymphs Abs: 2.9 10*3/uL (ref 0.7–4.0)
MCHC: 33.5 g/dL (ref 30.0–36.0)
MCV: 93.7 fl (ref 78.0–100.0)
Monocytes Absolute: 0.6 10*3/uL (ref 0.1–1.0)
Monocytes Relative: 7.5 % (ref 3.0–12.0)
Neutro Abs: 4.6 10*3/uL (ref 1.4–7.7)
Neutrophils Relative %: 54.4 % (ref 43.0–77.0)
Platelets: 215 10*3/uL (ref 150.0–400.0)
RBC: 5.1 Mil/uL (ref 4.22–5.81)
RDW: 13.5 % (ref 11.5–15.5)
WBC: 8.5 10*3/uL (ref 4.0–10.5)

## 2023-06-07 LAB — BASIC METABOLIC PANEL WITH GFR
BUN: 20 mg/dL (ref 6–23)
CO2: 30 meq/L (ref 19–32)
Calcium: 9.3 mg/dL (ref 8.4–10.5)
Chloride: 104 meq/L (ref 96–112)
Creatinine, Ser: 1.36 mg/dL (ref 0.40–1.50)
GFR: 51.03 mL/min — ABNORMAL LOW (ref >60.00)
Glucose, Bld: 98 mg/dL (ref 70–99)
Potassium: 4.8 meq/L (ref 3.5–5.1)
Sodium: 141 meq/L (ref 135–145)

## 2023-06-07 LAB — MICROALBUMIN / CREATININE URINE RATIO
Creatinine,U: 77.3 mg/dL
Microalb Creat Ratio: UNDETERMINED mg/g (ref 0.0–30.0)
Microalb, Ur: <0.7 mg/dL

## 2023-06-07 LAB — TSH: TSH: 2.26 u[IU]/mL (ref 0.35–5.50)

## 2023-06-07 LAB — HEMOGLOBIN A1C: Hgb A1c MFr Bld: 6.4 % (ref 4.6–6.5)

## 2023-06-07 LAB — PSA: PSA: 6.66 ng/mL — ABNORMAL HIGH (ref 0.10–4.00)

## 2023-06-07 NOTE — Progress Notes (Unsigned)
 Subjective:  Patient ID: Brett Robles, male    DOB: [birth year]  Age: 73 y.o. MRN: [id number]  CC: Hypertension, Diabetes, and Hyperlipidemia   HPI Brett Robles presents for f/up ----  Discussed the use of AI scribe software for clinical note transcription with the patient, who gave verbal consent to proceed.  History of Present Illness   AVEER MERLAN "Brett Robles" is a 73 year old male who presents for a routine follow-up visit.  He feels good overall and mentions that his recent visit with the cardiologist went well. No dizziness or lightheadedness despite having a low heart rate. Blood pressure is stable at 132/80 mmHg, consistent with home readings.  He notes that today is a 'down day' due to the weather, which has limited his outdoor activities. No weakness or dizziness.  He had an eye exam last Friday, and it was noted that the cornea in his right eye is starting to dry out. He was advised to use an over-the-counter ointment in that eye.  He has a history of a bleeding ulcer and underwent both an endoscopy and colonoscopy in 2014, which were normal. He acknowledges that a colonoscopy is due this year according to his medical chart.  In January, he experienced tennis elbow after using a new rowing machine too vigorously. He sought care at [facility], where a scan ruled out a blood clot, and he was given a physical therapy regimen. The condition has since resolved.  He sleeps about six to seven hours at night before needing to urinate.       Outpatient Medications Prior to Visit  Medication Sig Dispense Refill   amLODipine  (NORVASC ) 10 MG tablet Take 1 tablet (10 mg total) by mouth daily. 90 tablet 0   aspirin  EC 81 MG tablet Take 1 tablet (81 mg total) by mouth daily. 90 tablet 1   chlorhexidine (PERIDEX) 0.12 % solution SMARTSIG:By Mouth     cholecalciferol (VITAMIN D3) 25 MCG (1000 UNIT) tablet Take 1,000 Units by mouth daily.     dorzolamide -timolol   (COSOPT ) 22.3-6.8 MG/ML ophthalmic solution Place 1 drop into both eyes 2 (two) times daily.     empagliflozin  (JARDIANCE ) 25 MG TABS tablet Take 1 tablet (25 mg total) by mouth daily before breakfast. 90 tablet 0   fexofenadine (ALLEGRA) 180 MG tablet Take 180 mg by mouth daily.     Multiple Vitamin (MULTIVITAMIN ADULT PO) Take 1 capsule by mouth daily.     olmesartan  (BENICAR ) 40 MG tablet TAKE 1 TABLET BY MOUTH EVERY DAY 90 tablet 0   pantoprazole  (PROTONIX ) 40 MG tablet Take 1 tablet (40 mg total) by mouth daily. 90 tablet 0   rosuvastatin  (CRESTOR ) 10 MG tablet Take 1 tablet (10 mg total) by mouth daily. 90 tablet 0   No facility-administered medications prior to visit.    ROS Review of Systems  Objective:  BP 132/80 (BP Location: Left Arm, Patient Position: Sitting, Cuff Size: Normal)   Pulse (!) 58   Temp 98 F (36.7 C) (Oral)   Resp 16   Ht 5\' 9"  (1.753 m)   Wt 172 lb (78 kg)   SpO2 95%   BMI 25.40 kg/m   BP Readings from Last 3 Encounters:  06/07/23 132/80  03/22/23 130/80  01/05/23 128/82    Wt Readings from Last 3 Encounters:  06/07/23 172 lb (78 kg)  03/22/23 176 lb 6.4 oz (80 kg)  01/05/23 175 lb (79.4 kg)  Physical Exam Cardiovascular:     Rate and Rhythm: Normal rate and regular rhythm.     Heart sounds: Murmur heard.     Systolic murmur is present with a grade of 1/6.  Abdominal:     Hernia: There is no hernia in the left inguinal area or right inguinal area.  Genitourinary:    Pubic Area: No rash.      Penis: Normal and circumcised.      Testes: Normal.     Epididymis:     Right: Normal.     Left: Normal.     Prostate: Enlarged. Not tender and no nodules present.     Rectum: Normal. Guaiac result negative. No mass, tenderness, anal fissure, external hemorrhoid or internal hemorrhoid. Normal anal tone.  Lymphadenopathy:     Lower Body: No right inguinal adenopathy. No left inguinal adenopathy.     Lab Results  Component Value Date   WBC  8.5 06/07/2023   HGB 16.0 06/07/2023   HCT 47.8 06/07/2023   PLT 215.0 06/07/2023   GLUCOSE 98 06/07/2023   CHOL 99 01/05/2023   TRIG 99.0 01/05/2023   HDL 37.20 (L) 01/05/2023   LDLCALC 42 01/05/2023   ALT 28 01/05/2023   AST 23 01/05/2023   NA 141 06/07/2023   K 4.8 06/07/2023   CL 104 06/07/2023   CREATININE 1.36 06/07/2023   BUN 20 06/07/2023   CO2 30 06/07/2023   TSH 2.26 06/07/2023   PSA 6.66 (H) 06/07/2023   INR 0.9 12/19/2019   HGBA1C 6.4 06/07/2023   MICROALBUR <0.7 06/07/2023    CT CARDIAC SCORING ([facility] LOCATIONS ONLY) Result Date: 09/22/2022 CLINICAL DATA:  Hyperlipidemia. * Tracking Code: FCC * EXAM: CT CARDIAC CORONARY ARTERY CALCIUM  SCORE TECHNIQUE: Non-contrast imaging through the heart was performed using prospective ECG gating. Image post processing was performed on an independent workstation, allowing for quantitative analysis of the heart and coronary arteries. Note that this exam targets the heart and the chest was not imaged in its entirety. COMPARISON:  Abdominal CT of 06/19/2012. FINDINGS: CORONARY CALCIUM  SCORES: Left Main: 0 LAD: 270 LCx: 146 RCA: 31 Total Agatston Score: 447 MESA database percentile: 64 AORTA MEASUREMENTS: Ascending Aorta: 3.5 cm Descending Aorta:3.1 cm OTHER FINDINGS: No pleural fluid. Central left lower lobe well-circumscribed nodule with minimal calcification measures 1.6 x 1.4 cm on 46/9. This was present and measured 1.5 x 1.3 cm on 06/18/2012 abdominal CT, consistent with a benign etiology. Aortic atherosclerosis. Normal heart size, without pericardial effusion. No imaged thoracic adenopathy. Normal imaged portions of the liver, spleen, stomach. No acute osseous abnormality. IMPRESSION: 1. Total Agatston score of 447, corresponding to 64th percentile for age, sex, and race based cohort. 2. Central left lower lobe well-circumscribed 1.6 cm nodule has minimally enlarged compared to 2014 abdominal CT and is considered benign. No specific follow-up  indicated. 3.  Aortic Atherosclerosis (ICD10-I70.0). Electronically Signed   By: Lore Rode M.D.   On: 09/22/2022 13:45    Assessment & Plan:  Benign prostatic hyperplasia without lower urinary tract symptoms -     PSA; Future -     Urinalysis, Routine w reflex microscopic; Future  Essential hypertension, benign -     CBC with Differential/Platelet; Future -     Basic metabolic panel with GFR; Future -     TSH; Future  Stage 3a chronic kidney disease (HCC) -     Urinalysis, Routine w reflex microscopic; Future -     Microalbumin / creatinine urine ratio;  Future  Screening for colon cancer -     Ambulatory referral to Gastroenterology  Hyperlipidemia with target LDL less than 130 -     TSH; Future  Type II diabetes mellitus with manifestations (HCC) -     Urinalysis, Routine w reflex microscopic; Future -     Hemoglobin A1c; Future -     Microalbumin / creatinine urine ratio; Future     Follow-up: Return in about 6 months (around 12/08/2023).  Sandra Crouch, MD

## 2023-06-07 NOTE — Patient Instructions (Signed)

## 2023-06-08 ENCOUNTER — Ambulatory Visit: Payer: Self-pay | Admitting: Internal Medicine

## 2023-06-08 DIAGNOSIS — R972 Elevated prostate specific antigen [PSA]: Secondary | ICD-10-CM | POA: Insufficient documentation

## 2023-06-08 LAB — URINALYSIS, ROUTINE W REFLEX MICROSCOPIC
Bilirubin Urine: NEGATIVE
Hgb urine dipstick: NEGATIVE
Ketones, ur: NEGATIVE
Leukocytes,Ua: NEGATIVE
Nitrite: NEGATIVE
RBC / HPF: NONE SEEN (ref 0–?)
Specific Gravity, Urine: 1.01 (ref 1.000–1.030)
Total Protein, Urine: NEGATIVE
Urine Glucose: >=1000 — AB
Urobilinogen, UA: 0.2 (ref 0.0–1.0)
pH: 7 (ref 5.0–8.0)

## 2023-06-09 MED ORDER — AMLODIPINE BESYLATE 10 MG PO TABS
10.0000 mg | ORAL_TABLET | Freq: Every day | ORAL | 0 refills | Status: DC
Start: 2023-06-09 — End: 2023-11-17

## 2023-07-01 ENCOUNTER — Other Ambulatory Visit: Payer: Self-pay | Admitting: Internal Medicine

## 2023-07-01 DIAGNOSIS — I10 Essential (primary) hypertension: Secondary | ICD-10-CM

## 2023-08-09 ENCOUNTER — Encounter: Payer: Self-pay | Admitting: Cardiology

## 2023-08-12 ENCOUNTER — Other Ambulatory Visit: Payer: Self-pay | Admitting: Internal Medicine

## 2023-08-12 DIAGNOSIS — E118 Type 2 diabetes mellitus with unspecified complications: Secondary | ICD-10-CM

## 2023-08-12 DIAGNOSIS — K269 Duodenal ulcer, unspecified as acute or chronic, without hemorrhage or perforation: Secondary | ICD-10-CM

## 2023-08-12 DIAGNOSIS — E785 Hyperlipidemia, unspecified: Secondary | ICD-10-CM

## 2023-08-12 DIAGNOSIS — N1831 Chronic kidney disease, stage 3a: Secondary | ICD-10-CM

## 2023-08-15 ENCOUNTER — Encounter: Payer: Self-pay | Admitting: Internal Medicine

## 2023-08-16 ENCOUNTER — Telehealth: Payer: Self-pay | Admitting: Internal Medicine

## 2023-08-16 NOTE — Telephone Encounter (Signed)
 Patient walked in regarding RX refills. He went to Pharmacy and they told him he needed to stop by office because numerous requests were sent to office. He states his medications run out. Please send to [facility]

## 2023-08-20 NOTE — Telephone Encounter (Signed)
 This has been handled in a telephone call and patient has been made aware and going to get his medication

## 2023-08-20 NOTE — Telephone Encounter (Signed)
 Patients medication has been refilled and he is going to pick them up.

## 2023-09-30 ENCOUNTER — Other Ambulatory Visit: Payer: Self-pay | Admitting: Internal Medicine

## 2023-09-30 DIAGNOSIS — I10 Essential (primary) hypertension: Secondary | ICD-10-CM

## 2023-10-03 ENCOUNTER — Encounter: Payer: Self-pay | Admitting: Internal Medicine

## 2023-10-04 ENCOUNTER — Other Ambulatory Visit: Payer: Self-pay

## 2023-10-04 DIAGNOSIS — I10 Essential (primary) hypertension: Secondary | ICD-10-CM

## 2023-10-04 MED ORDER — OLMESARTAN MEDOXOMIL 40 MG PO TABS: 40.0000 mg | ORAL_TABLET | Freq: Every day | ORAL | 1 refills | Status: AC

## 2023-10-11 ENCOUNTER — Encounter: Payer: Self-pay | Admitting: Podiatry

## 2023-10-11 ENCOUNTER — Ambulatory Visit (INDEPENDENT_AMBULATORY_CARE_PROVIDER_SITE_OTHER)

## 2023-10-11 ENCOUNTER — Ambulatory Visit: Admitting: Podiatry

## 2023-10-11 DIAGNOSIS — M21611 Bunion of right foot: Secondary | ICD-10-CM

## 2023-10-11 DIAGNOSIS — M21612 Bunion of left foot: Secondary | ICD-10-CM

## 2023-10-11 DIAGNOSIS — M1A3710 Chronic gout due to renal impairment, right ankle and foot, without tophus (tophi): Secondary | ICD-10-CM | POA: Diagnosis not present

## 2023-10-11 MED ORDER — METHYLPREDNISOLONE 4 MG PO TBPK: ORAL_TABLET | ORAL | 0 refills | Status: DC

## 2023-10-11 MED ORDER — COLCHICINE 0.6 MG PO TABS: 0.6000 mg | ORAL_TABLET | Freq: Two times a day (BID) | ORAL | 2 refills | Status: DC

## 2023-10-11 NOTE — Progress Notes (Unsigned)
 Chief Complaint  Patient presents with   Foot Pain    R great toe pain.  Worse at night after being on feet. Soak in [place] and elevate. A1c 6.5 may.  No anti coag    HPI: 73 y.o. male presents today for bunion evaluation.  Both feet are affected.  Right is worse than the left.  He also endorses history of gout and has had recurrent flares.  He is diabetic but this is controlled.  Patient is very active for his age, he states that he has a long biking trip coming up and he would like to discuss ways to manage the bunion as well as limit and treat any potential gout flares.  Also interested in discussing long-term treatment of the bunions.  Past Medical History:  Diagnosis Date   Adenomatous colon polyp 04/27/2006   Allergy Every spring & fall   Arthritis Comes and goes   Asthma    Gastric ulcer    Glaucoma    High cholesterol    Hypertension    Internal hemorrhoids     Past Surgical History:  Procedure Laterality Date   COLONOSCOPY  01/26/2006   COLONOSCOPY N/A 06/27/2012   Procedure: COLONOSCOPY;  Surgeon: Princella CHRISTELLA Nida, MD;  Location: [facility] ENDOSCOPY;  Service: Endoscopy;  Laterality: N/A;   ENTEROSCOPY N/A 06/26/2012   Procedure: ENTEROSCOPY;  Surgeon: Toribio SHAUNNA Cedar, MD;  Location: [facility] ENDOSCOPY;  Service: Endoscopy;  Laterality: N/A;   ESOPHAGOGASTRODUODENOSCOPY N/A 06/19/2012   Procedure: ESOPHAGOGASTRODUODENOSCOPY (EGD);  Surgeon: Princella CHRISTELLA Nida, MD;  Location: [facility] ENDOSCOPY;  Service: Endoscopy;  Laterality: N/A;   EYE SURGERY     @ 14 on rt eye    Allergies  Allergen Reactions   Pravachol [Pravastatin Sodium]     Muscle aches    ROS denies any nausea, vomiting, fever, chills, chest pain, shortness of breath   PHYSICAL EXAM:  General: The patient is alert and oriented x3 in no acute distress.  Dermatology: Skin is warm, dry and supple bilateral lower extremities. Interspaces are clear of maceration and debris.  No rashes noted.   Vascular: Palpable pedal  pulses bilaterally. Capillary refill within normal limits.  No appreciable edema.  No erythema or calor.  Neurological: Light touch sensation grossly intact bilateral feet.   Musculoskeletal Exam:  There is a bony medial prominence on the dorsomedial aspect of the 1st metatarsal head of the bilateral foot.  There is pain on palpation of the bump in this area.  Lateral deviation of the hallux at the MPJ level.  1st MPJ ROM is decreased.  Left first MPJ there is some crepitus.  Bunion deformity is not fully reducible somewhat track bound.  RADIOGRAPHIC EXAM: Left and right foot radiographs 3 views 10/11/2023 Right foot: Moderate bunion deformity noted with enlargement of medial eminence.  Increased HA angle as well.  Increased IM angle greater than 12 degrees.  Atavistic cuneiform present.  There is some slight met adductus present.  No acute fractures noted.  Sesamoid rotation noted  Left foot: Mild to moderate bunion deformity present with joint space narrowing present.  Enlargement of medial eminence first metatarsal head.  HA angle noted.  There is some sesamoid rotation as well.  No acute fractures.  Normal osseous mineralization  ASSESSMENT/PLAN OF CARE: 1. Bilateral bunions   2. 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 Day 1: On onset of flare take 2 tablets followed by third tablet an hour later.    Dispense:  30 tablet    Refill:  2   colchicine  0.6 MG tablet    Sig: Take 1 tablet (0.6 mg total) by mouth 2 (two) times daily. Day 1: On onset of flare take 2 tablets followed by third tablet an hour later.    Dispense:  30 tablet    Refill:  2   None  Discussed patient's  condition and possible etiologies today.  Discussed conservative treatment options with patient today, including shoe modification / arch supports, off-loading, cortisone injectione, NSAID topical / oral therapy, and toe splints and shields.  Briefly discussed surgical intervention if conservative options are not successful.    Today we discussed dancers pads and use of gel bunion sleeves as he does get some pain in the first met heads as well.  Recommend over-the-counter inserts.  Prices were offered which patient deferred at this point.  Has had history of gout flares.  Did discuss diet modification as a means to prevent this.  Will provide colchicine  and Medrol  Dosepak prescriptions for him if he does develop this and discussed proper use of these medications.  Did discuss potential for the Medrol  Dosepak to increase blood sugar.  Regarding the bony deformities, did briefly discuss surgery if pain poorly managed with conservative care.  He is potentially interested in this.   Return in about 4 weeks (around 11/08/2023) for Bunions.    Simcha Farrington L. Lamount MAUL, AACFAS [facility]     [address] [place], [place] [address]                Office [phone]  Fax [phone]

## 2023-10-12 MED ORDER — COLCHICINE 0.6 MG PO TABS: 0.6000 mg | ORAL_TABLET | Freq: Two times a day (BID) | ORAL | 2 refills | Status: AC

## 2023-10-14 ENCOUNTER — Encounter: Payer: Self-pay | Admitting: Cardiology

## 2023-10-22 ENCOUNTER — Encounter: Payer: Self-pay | Admitting: Internal Medicine

## 2023-10-22 ENCOUNTER — Other Ambulatory Visit: Payer: Self-pay | Admitting: Internal Medicine

## 2023-10-22 DIAGNOSIS — J4520 Mild intermittent asthma, uncomplicated: Secondary | ICD-10-CM

## 2023-10-22 MED ORDER — FLUTICASONE FUROATE-VILANTEROL 100-25 MCG/ACT IN AEPB: 1.0000 | INHALATION_SPRAY | Freq: Every day | RESPIRATORY_TRACT | 0 refills | Status: DC

## 2023-11-14 ENCOUNTER — Other Ambulatory Visit: Payer: Self-pay | Admitting: Internal Medicine

## 2023-11-14 DIAGNOSIS — K269 Duodenal ulcer, unspecified as acute or chronic, without hemorrhage or perforation: Secondary | ICD-10-CM

## 2023-11-14 DIAGNOSIS — E118 Type 2 diabetes mellitus with unspecified complications: Secondary | ICD-10-CM

## 2023-11-14 DIAGNOSIS — E785 Hyperlipidemia, unspecified: Secondary | ICD-10-CM

## 2023-11-14 DIAGNOSIS — N1831 Chronic kidney disease, stage 3a: Secondary | ICD-10-CM

## 2023-11-17 ENCOUNTER — Other Ambulatory Visit: Payer: Self-pay | Admitting: Internal Medicine

## 2023-11-17 DIAGNOSIS — I10 Essential (primary) hypertension: Secondary | ICD-10-CM

## 2023-11-22 ENCOUNTER — Ambulatory Visit: Admitting: Podiatry

## 2023-12-10 ENCOUNTER — Other Ambulatory Visit: Payer: Self-pay | Admitting: Internal Medicine

## 2023-12-10 DIAGNOSIS — I10 Essential (primary) hypertension: Secondary | ICD-10-CM

## 2023-12-15 ENCOUNTER — Encounter: Payer: Self-pay | Admitting: Internal Medicine

## 2023-12-17 ENCOUNTER — Other Ambulatory Visit: Payer: Self-pay | Admitting: Internal Medicine

## 2023-12-17 DIAGNOSIS — J4520 Mild intermittent asthma, uncomplicated: Secondary | ICD-10-CM

## 2024-01-06 ENCOUNTER — Ambulatory Visit: Admitting: Internal Medicine

## 2024-01-06 ENCOUNTER — Ambulatory Visit: Payer: Self-pay | Admitting: Internal Medicine

## 2024-01-06 ENCOUNTER — Encounter: Payer: Self-pay | Admitting: Internal Medicine

## 2024-01-06 ENCOUNTER — Ambulatory Visit (INDEPENDENT_AMBULATORY_CARE_PROVIDER_SITE_OTHER)

## 2024-01-06 VITALS — BP 136/82 | HR 70 | Temp 98.5°F | Resp 16 | Ht 69.0 in | Wt 173.4 lb

## 2024-01-06 DIAGNOSIS — M19011 Primary osteoarthritis, right shoulder: Secondary | ICD-10-CM | POA: Insufficient documentation

## 2024-01-06 DIAGNOSIS — E785 Hyperlipidemia, unspecified: Secondary | ICD-10-CM

## 2024-01-06 DIAGNOSIS — Z0001 Encounter for general adult medical examination with abnormal findings: Secondary | ICD-10-CM

## 2024-01-06 DIAGNOSIS — I10 Essential (primary) hypertension: Secondary | ICD-10-CM

## 2024-01-06 DIAGNOSIS — S4991XA Unspecified injury of right shoulder and upper arm, initial encounter: Secondary | ICD-10-CM

## 2024-01-06 DIAGNOSIS — K7581 Nonalcoholic steatohepatitis (NASH): Secondary | ICD-10-CM

## 2024-01-06 DIAGNOSIS — N1831 Chronic kidney disease, stage 3a: Secondary | ICD-10-CM | POA: Insufficient documentation

## 2024-01-06 DIAGNOSIS — R972 Elevated prostate specific antigen [PSA]: Secondary | ICD-10-CM

## 2024-01-06 LAB — CBC WITH DIFFERENTIAL/PLATELET
Basophils Absolute: 0.1 K/uL (ref 0.0–0.1)
Basophils Relative: 0.7 % (ref 0.0–3.0)
Eosinophils Absolute: 0.3 K/uL (ref 0.0–0.7)
Eosinophils Relative: 2.9 % (ref 0.0–5.0)
HCT: 44.6 % (ref 39.0–52.0)
Hemoglobin: 15.3 g/dL (ref 13.0–17.0)
Lymphocytes Relative: 32.3 % (ref 12.0–46.0)
Lymphs Abs: 3.4 K/uL (ref 0.7–4.0)
MCHC: 34.3 g/dL (ref 30.0–36.0)
MCV: 94.7 fl (ref 78.0–100.0)
Monocytes Absolute: 0.9 K/uL (ref 0.1–1.0)
Monocytes Relative: 8.5 % (ref 3.0–12.0)
Neutro Abs: 5.9 K/uL (ref 1.4–7.7)
Neutrophils Relative %: 55.6 % (ref 43.0–77.0)
Platelets: 234 K/uL (ref 150.0–400.0)
RBC: 4.71 Mil/uL (ref 4.22–5.81)
RDW: 13.6 % (ref 11.5–15.5)
WBC: 10.6 K/uL — ABNORMAL HIGH (ref 4.0–10.5)

## 2024-01-06 LAB — PSA: PSA: 5.99 ng/mL — ABNORMAL HIGH (ref 0.10–4.00)

## 2024-01-06 LAB — LIPID PANEL
Cholesterol: 133 mg/dL (ref 0–200)
HDL: 36.8 mg/dL — ABNORMAL LOW (ref >39.00)
LDL Cholesterol: 57 mg/dL (ref 0–99)
NonHDL: 95.74
Total CHOL/HDL Ratio: 4
Triglycerides: 192 mg/dL — ABNORMAL HIGH (ref 0.0–149.0)
VLDL: 38.4 mg/dL (ref 0.0–40.0)

## 2024-01-06 LAB — URINALYSIS, ROUTINE W REFLEX MICROSCOPIC
Bilirubin Urine: NEGATIVE
Hgb urine dipstick: NEGATIVE
Ketones, ur: NEGATIVE
Leukocytes,Ua: NEGATIVE
Nitrite: NEGATIVE
RBC / HPF: NONE SEEN (ref 0–?)
Specific Gravity, Urine: 1.02 (ref 1.000–1.030)
Total Protein, Urine: NEGATIVE
Urine Glucose: >=1000 — AB
Urobilinogen, UA: 0.2 (ref 0.0–1.0)
pH: 6 (ref 5.0–8.0)

## 2024-01-06 LAB — PROTIME-INR
INR: 1 ratio (ref 0.8–1.0)
Prothrombin Time: 10.9 s (ref 9.6–13.1)

## 2024-01-06 LAB — HEPATIC FUNCTION PANEL
ALT: 70 U/L — ABNORMAL HIGH (ref 0–53)
AST: 29 U/L (ref 0–37)
Albumin: 4.4 g/dL (ref 3.5–5.2)
Alkaline Phosphatase: 66 U/L (ref 39–117)
Bilirubin, Direct: 0.3 mg/dL (ref 0.0–0.3)
Total Bilirubin: 1.7 mg/dL — ABNORMAL HIGH (ref 0.2–1.2)
Total Protein: 6.7 g/dL (ref 6.0–8.3)

## 2024-01-06 LAB — BASIC METABOLIC PANEL WITH GFR
BUN: 23 mg/dL (ref 6–23)
CO2: 31 meq/L (ref 19–32)
Calcium: 9.4 mg/dL (ref 8.4–10.5)
Chloride: 103 meq/L (ref 96–112)
Creatinine, Ser: 1.32 mg/dL (ref 0.40–1.50)
GFR: 52.68 mL/min — ABNORMAL LOW (ref >60.00)
Glucose, Bld: 104 mg/dL — ABNORMAL HIGH (ref 70–99)
Potassium: 4 meq/L (ref 3.5–5.1)
Sodium: 140 meq/L (ref 135–145)

## 2024-01-06 LAB — URIC ACID: Uric Acid, Serum: 5 mg/dL (ref 4.0–7.8)

## 2024-01-06 LAB — HEMOGLOBIN A1C: Hgb A1c MFr Bld: 7.3 % — ABNORMAL HIGH (ref 4.6–6.5)

## 2024-01-06 MED ORDER — ROSUVASTATIN CALCIUM 10 MG PO TABS: 10.0000 mg | ORAL_TABLET | Freq: Every day | ORAL | 1 refills | Status: AC

## 2024-01-06 MED ORDER — EMPAGLIFLOZIN 25 MG PO TABS: 25.0000 mg | ORAL_TABLET | Freq: Every day | ORAL | 1 refills | Status: AC

## 2024-01-06 NOTE — Patient Instructions (Signed)
 Health Maintenance, Male  Adopting a healthy lifestyle and getting preventive care are important in promoting health and wellness. Ask your health care provider about:  The right schedule for you to have regular tests and exams.  Things you can do on your own to prevent diseases and keep yourself healthy.  What should I know about diet, weight, and exercise?  Eat a healthy diet    Eat a diet that includes plenty of vegetables, fruits, low-fat dairy products, and lean protein.  Do not eat a lot of foods that are high in solid fats, added sugars, or sodium.  Maintain a healthy weight  Body mass index (BMI) is a measurement that can be used to identify possible weight problems. It estimates body fat based on height and weight. Your health care provider can help determine your BMI and help you achieve or maintain a healthy weight.  Get regular exercise  Get regular exercise. This is one of the most important things you can do for your health. Most adults should:  Exercise for at least 150 minutes each week. The exercise should increase your heart rate and make you sweat (moderate-intensity exercise).  Do strengthening exercises at least twice a week. This is in addition to the moderate-intensity exercise.  Spend less time sitting. Even light physical activity can be beneficial.  Watch cholesterol and blood lipids  Have your blood tested for lipids and cholesterol at 75 years of age, then have this test every 5 years.  You may need to have your cholesterol levels checked more often if:  Your lipid or cholesterol levels are high.  You are older than 75 years of age.  You are at high risk for heart disease.  What should I know about cancer screening?  Many types of cancers can be detected early and may often be prevented. Depending on your health history and family history, you may need to have cancer screening at various ages. This may include screening for:  Colorectal cancer.  Prostate cancer.  Skin cancer.  Lung  cancer.  What should I know about heart disease, diabetes, and high blood pressure?  Blood pressure and heart disease  High blood pressure causes heart disease and increases the risk of stroke. This is more likely to develop in people who have high blood pressure readings or are overweight.  Talk with your health care provider about your target blood pressure readings.  Have your blood pressure checked:  Every 3-5 years if you are 24-52 years of age.  Every year if you are 3 years old or older.  If you are between the ages of 60 and 72 and are a current or former smoker, ask your health care provider if you should have a one-time screening for abdominal aortic aneurysm (AAA).  Diabetes  Have regular diabetes screenings. This checks your fasting blood sugar level. Have the screening done:  Once every three years after age 66 if you are at a normal weight and have a low risk for diabetes.  More often and at a younger age if you are overweight or have a high risk for diabetes.  What should I know about preventing infection?  Hepatitis B  If you have a higher risk for hepatitis B, you should be screened for this virus. Talk with your health care provider to find out if you are at risk for hepatitis B infection.  Hepatitis C  Blood testing is recommended for:  Everyone born from [birth year] through 1967.  Anyone  with known risk factors for hepatitis C.  Sexually transmitted infections (STIs)  You should be screened each year for STIs, including gonorrhea and chlamydia, if:  You are sexually active and are younger than 75 years of age.  You are older than 75 years of age and your health care provider tells you that you are at risk for this type of infection.  Your sexual activity has changed since you were last screened, and you are at increased risk for chlamydia or gonorrhea. Ask your health care provider if you are at risk.  Ask your health care provider about whether you are at high risk for HIV. Your health care provider  may recommend a prescription medicine to help prevent HIV infection. If you choose to take medicine to prevent HIV, you should first get tested for HIV. You should then be tested every 3 months for as long as you are taking the medicine.  Follow these instructions at home:  Alcohol use  Do not drink alcohol if your health care provider tells you not to drink.  If you drink alcohol:  Limit how much you have to 0-2 drinks a day.  Know how much alcohol is in your drink. In the [place] one drink equals one 12 oz bottle of beer (355 mL), one 5 oz glass of wine (148 mL), or one 1 oz glass of hard liquor (44 mL).  Lifestyle  Do not use any products that contain nicotine or tobacco. These products include cigarettes, chewing tobacco, and vaping devices, such as e-cigarettes. If you need help quitting, ask your health care provider.  Do not use street drugs.  Do not share needles.  Ask your health care provider for help if you need support or information about quitting drugs.  General instructions  Schedule regular health, dental, and eye exams.  Stay current with your vaccines.  Tell your health care provider if:  You often feel depressed.  You have ever been abused or do not feel safe at home.  Summary  Adopting a healthy lifestyle and getting preventive care are important in promoting health and wellness.  Follow your health care provider's instructions about healthy diet, exercising, and getting tested or screened for diseases.  Follow your health care provider's instructions on monitoring your cholesterol and blood pressure.  This information is not intended to replace advice given to you by your health care provider. Make sure you discuss any questions you have with your health care provider.  Document Revised: 06/03/2020 Document Reviewed: 06/03/2020  Elsevier Patient Education  2024 [employer].
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
## 2024-01-09 ENCOUNTER — Encounter: Payer: Self-pay | Admitting: Internal Medicine

## 2024-01-10 ENCOUNTER — Other Ambulatory Visit: Payer: Self-pay

## 2024-01-10 DIAGNOSIS — I10 Essential (primary) hypertension: Secondary | ICD-10-CM

## 2024-01-10 MED ORDER — AMLODIPINE BESYLATE 10 MG PO TABS: 10.0000 mg | ORAL_TABLET | Freq: Every day | ORAL | 1 refills | Status: AC

## 2024-01-18 ENCOUNTER — Ambulatory Visit: Admitting: Orthopaedic Surgery

## 2024-01-18 DIAGNOSIS — M25511 Pain in right shoulder: Secondary | ICD-10-CM | POA: Diagnosis not present

## 2024-01-18 DIAGNOSIS — G8929 Other chronic pain: Secondary | ICD-10-CM | POA: Diagnosis not present
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
## 2024-02-04 ENCOUNTER — Other Ambulatory Visit: Payer: Self-pay

## 2024-02-04 ENCOUNTER — Encounter: Payer: Self-pay | Admitting: Sports Medicine

## 2024-02-04 ENCOUNTER — Ambulatory Visit: Admitting: Sports Medicine

## 2024-02-04 DIAGNOSIS — E1122 Type 2 diabetes mellitus with diabetic chronic kidney disease: Secondary | ICD-10-CM

## 2024-02-04 DIAGNOSIS — N1831 Chronic kidney disease, stage 3a: Secondary | ICD-10-CM | POA: Diagnosis not present

## 2024-02-04 DIAGNOSIS — M25411 Effusion, right shoulder: Secondary | ICD-10-CM

## 2024-02-04 DIAGNOSIS — M25511 Pain in right shoulder: Secondary | ICD-10-CM | POA: Diagnosis not present

## 2024-02-04 DIAGNOSIS — G8929 Other chronic pain: Secondary | ICD-10-CM

## 2024-02-04 MED ORDER — LIDOCAINE HCL 1 % IJ SOLN
2.0000 mL | INTRAMUSCULAR | Status: AC | PRN
  Administered 2024-02-04: 2 mL

## 2024-02-04 MED ORDER — BUPIVACAINE HCL 0.25 % IJ SOLN
2.0000 mL | INTRAMUSCULAR | Status: AC | PRN
  Administered 2024-02-04: 2 mL via INTRA_ARTICULAR

## 2024-02-04 MED ORDER — METHYLPREDNISOLONE ACETATE 40 MG/ML IJ SUSP
80.0000 mg | INTRAMUSCULAR | Status: AC | PRN
  Administered 2024-02-04: 80 mg via INTRA_ARTICULAR

## 2024-02-04 NOTE — Progress Notes (Signed)
 Patient says that his shoulder is feeling better than it was at his last visit as he has been using it more. He would still like to have the injection today. He begins physical therapy on Monday.
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
## 2024-02-06 ENCOUNTER — Encounter: Payer: Self-pay | Admitting: Internal Medicine

## 2024-02-07 ENCOUNTER — Encounter: Payer: Self-pay | Admitting: Physical Therapy

## 2024-02-07 ENCOUNTER — Ambulatory Visit: Admitting: Physical Therapy

## 2024-02-07 DIAGNOSIS — R293 Abnormal posture: Secondary | ICD-10-CM | POA: Diagnosis not present

## 2024-02-07 DIAGNOSIS — M25511 Pain in right shoulder: Secondary | ICD-10-CM | POA: Diagnosis not present

## 2024-02-07 DIAGNOSIS — M6281 Muscle weakness (generalized): Secondary | ICD-10-CM
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
## 2024-02-12 ENCOUNTER — Other Ambulatory Visit: Payer: Self-pay | Admitting: Internal Medicine

## 2024-02-12 DIAGNOSIS — K269 Duodenal ulcer, unspecified as acute or chronic, without hemorrhage or perforation: Secondary | ICD-10-CM

## 2024-02-13 ENCOUNTER — Other Ambulatory Visit: Payer: Self-pay | Admitting: Internal Medicine

## 2024-02-13 DIAGNOSIS — J4520 Mild intermittent asthma, uncomplicated: Secondary | ICD-10-CM

## 2024-02-22 ENCOUNTER — Encounter: Admitting: Physical Therapy

## 2024-03-07 ENCOUNTER — Encounter: Admitting: Physical Therapy

## 2024-03-20 ENCOUNTER — Encounter: Admitting: Physical Therapy
